# Patient Record
Sex: Male | Born: 1940 | Race: White | Hispanic: No
Health system: Northeastern US, Academic
[De-identification: ages and names within clinical notes are randomized; demographics above are authoritative.]

## PROBLEM LIST (undated history)

## (undated) DIAGNOSIS — I10 Essential (primary) hypertension: Secondary | ICD-10-CM

## (undated) DIAGNOSIS — C801 Malignant (primary) neoplasm, unspecified: Secondary | ICD-10-CM

## (undated) DIAGNOSIS — J45909 Unspecified asthma, uncomplicated: Secondary | ICD-10-CM

## (undated) DIAGNOSIS — H269 Unspecified cataract: Secondary | ICD-10-CM

## (undated) DIAGNOSIS — I2510 Atherosclerotic heart disease of native coronary artery without angina pectoris: Secondary | ICD-10-CM

## (undated) DIAGNOSIS — E785 Hyperlipidemia, unspecified: Secondary | ICD-10-CM

## (undated) DIAGNOSIS — J439 Emphysema, unspecified: Secondary | ICD-10-CM

## (undated) DIAGNOSIS — J449 Chronic obstructive pulmonary disease, unspecified: Secondary | ICD-10-CM

## (undated) HISTORY — DX: Essential (primary) hypertension: I10

## (undated) HISTORY — DX: Malignant (primary) neoplasm, unspecified: C80.1

## (undated) HISTORY — DX: Emphysema, unspecified: J43.9

## (undated) HISTORY — DX: Unspecified cataract: H26.9

## (undated) HISTORY — DX: Chronic obstructive pulmonary disease, unspecified: J44.9

## (undated) HISTORY — DX: Unspecified asthma, uncomplicated: J45.909

## (undated) HISTORY — DX: Hyperlipidemia, unspecified: E78.5

## (undated) HISTORY — PX: CORONARY ARTERY BYPASS GRAFT: SHX141

## (undated) HISTORY — PX: TONSILLECTOMY: SUR1361

---

## 2020-01-12 ENCOUNTER — Other Ambulatory Visit: Payer: Self-pay

## 2020-01-12 ENCOUNTER — Ambulatory Visit (INDEPENDENT_AMBULATORY_CARE_PROVIDER_SITE_OTHER): Payer: Medicare Other | Admitting: Family Medicine

## 2020-01-12 ENCOUNTER — Encounter: Payer: Self-pay | Admitting: Family Medicine

## 2020-01-12 ENCOUNTER — Telehealth: Payer: Self-pay

## 2020-01-12 DIAGNOSIS — K219 Gastro-esophageal reflux disease without esophagitis: Secondary | ICD-10-CM | POA: Diagnosis not present

## 2020-01-12 DIAGNOSIS — I2510 Atherosclerotic heart disease of native coronary artery without angina pectoris: Secondary | ICD-10-CM

## 2020-01-12 DIAGNOSIS — I2583 Coronary atherosclerosis due to lipid rich plaque: Secondary | ICD-10-CM

## 2020-01-12 DIAGNOSIS — J449 Chronic obstructive pulmonary disease, unspecified: Secondary | ICD-10-CM | POA: Insufficient documentation

## 2020-01-12 DIAGNOSIS — E559 Vitamin D deficiency, unspecified: Secondary | ICD-10-CM | POA: Diagnosis not present

## 2020-01-12 DIAGNOSIS — J310 Chronic rhinitis: Secondary | ICD-10-CM

## 2020-01-12 DIAGNOSIS — F325 Major depressive disorder, single episode, in full remission: Secondary | ICD-10-CM

## 2020-01-12 DIAGNOSIS — C61 Malignant neoplasm of prostate: Secondary | ICD-10-CM

## 2020-01-12 NOTE — Assessment & Plan Note (Signed)
Normal exam today. Unclear reasoning for recent flare. He is on stioloto daily which helps. Will place referral to pulmonology to establish care in the area. Also place referral to home health for home monitoring/evaluation given recent hospitalization due to COPD flare.

## 2020-01-12 NOTE — Assessment & Plan Note (Signed)
Stable on Prilosec 20 mg daily. 

## 2020-01-12 NOTE — Progress Notes (Signed)
Dave Silva is a 79 y.o. male who presents today for an office visit.  He is a new patient.   Assessment/Plan:  Chronic Problems Addressed Today: Prostate cancer [facility]) s/p radiation therapy Has not had any recurrence since radiation therapy. Gets PSA checked annually by PCP. Will check with his next blood draw.  Major depression in remission [facility]) Patient admits to feeling down due to loss of his wife last year. He is currently on Zoloft but does not feel like it is doing much of anything. Overall feels like his mood is okay. Will defer making any medication changes and follow-up in 6 months. Discussed reasons to return to care. No reported SI or HI.  Rhinitis Stable. We will continue Flonase.  Vitamin D deficiency Taking 1000 international units daily. Will check vitamin D level next blood draw.  GERD (gastroesophageal reflux disease) Stable on Prilosec 20 mg daily.  CAD s/p CABGx4 in 2010 On aspirin 81mg  daily and Crestor 20 mg daily. He is also on Zetia 10 mg daily. Will place referral to establish cardiologist in the area.  COPD ([place]) Normal exam today. Unclear reasoning for recent flare. He is on stioloto daily which helps. Will place referral to pulmonology to establish care in the area. Also place referral to home health for home monitoring/evaluation given recent hospitalization due to COPD flare.     Subjective:  HPI:  Patient here as a new patient to establish care. Moved to [place] about 2 weeks ago. Was previously in [place]. He is now living with his son temporarily but is looking to find a place on his own. He was doing very well in regards to his health until the beginning of last year. Unfortunately lost his wife and then became isolated due to the pandemic. He has been maintaining his doctors appointments but does admit that he was not taking care of himself like he should have been. History is not entirely clear but sounds like a welfare call was made about 4  to 6 weeks ago by his family as he was not picking up the phone. Nurse arrived on the scene and found him to be in what sounds like an acute COPD flare. He was apparently admitted to the hospital for a week or 2 and then discharged to a rehab facility for a few weeks. He left the rehab facility in [place] to live in [place]. Do not have any records available to review. He has done well since being here in [place].  See A/P for status of chronic conditions.  ROS: Per HPI, otherwise a complete review of systems was negative.   PMH:  The following were reviewed and entered/updated in epic: Past Medical History:  Diagnosis Date  . Asthma   . Cancer ([place])   . Cataract   . COPD (chronic obstructive pulmonary disease) ([place])   . Emphysema of lung ([place])   . Hyperlipidemia   . Hypertension    Patient Active Problem List   Diagnosis Date Noted  . CAD s/p CABGx4 in 2010 01/12/2020  . GERD (gastroesophageal reflux disease) 01/12/2020  . Vitamin D deficiency 01/12/2020  . Rhinitis 01/12/2020  . Major depression in remission ([place]) 01/12/2020  . Prostate cancer [facility]) s/p radiation therapy 01/12/2020  . COPD [facility])    Past Surgical History:  Procedure Laterality Date  . CORONARY ARTERY BYPASS GRAFT    . TONSILLECTOMY      Family History  Problem Relation Age of Onset  . Cancer Paternal Grandfather   .  Colon cancer Neg Hx     Medications- reviewed and updated Current Outpatient Medications  Medication Sig Dispense Refill  . aspirin 81 MG EC tablet Take 81 mg by mouth daily. Swallow whole.    . cholecalciferol (VITAMIN D3) 25 MCG (1000 UNIT) tablet Take 1,000 Units by mouth daily.    Marland Kitchen ezetimibe (ZETIA) 10 MG tablet Take 10 mg by mouth daily.    . fluticasone (FLONASE) 50 MCG/ACT nasal spray Place into both nostrils daily.    . montelukast (SINGULAIR) 10 MG tablet Take 10 mg by mouth at bedtime.    Marland Kitchen omeprazole (PRILOSEC) 20 MG capsule Take 20 mg by mouth daily.    . ramipril  (ALTACE) 2.5 MG capsule Take 2.5 mg by mouth daily.    . rosuvastatin (CRESTOR) 20 MG tablet Take 20 mg by mouth daily.    . sertraline (ZOLOFT) 50 MG tablet Take 50 mg by mouth daily.    . Tiotropium Bromide-Olodaterol (STIOLTO RESPIMAT) 2.5-2.5 MCG/ACT AERS Inhale into the lungs.     No current facility-administered medications for this visit.    Allergies-reviewed and updated No Known Allergies  Social History   Socioeconomic History  . Marital status: Widowed    Spouse name: Not on file  . Number of children: Not on file  . Years of education: Not on file  . Highest education level: Not on file  Occupational History  . Not on file  Tobacco Use  . Smoking status: Former Smoker    Types: Cigarettes    Quit date: 02/11/1985    Years since quitting: 34.9  Substance and Sexual Activity  . Alcohol use: Yes    Alcohol/week: 5.0 standard drinks    Types: 5 Glasses of wine per week    Comment: social  . Drug use: Never  . Sexual activity: Not on file  Other Topics Concern  . Not on file  Social History Narrative  . Not on file   Social Determinants of Health   Financial Resource Strain:   . Difficulty of Paying Living Expenses: Not on file  Food Insecurity:   . Worried About Charity fundraiser in the Last Year: Not on file  . Ran Out of Food in the Last Year: Not on file  Transportation Needs:   . Lack of Transportation (Medical): Not on file  . Lack of Transportation (Non-Medical): Not on file  Physical Activity:   . Days of Exercise per Week: Not on file  . Minutes of Exercise per Session: Not on file  Stress:   . Feeling of Stress : Not on file  Social Connections:   . Frequency of Communication with Friends and Family: Not on file  . Frequency of Social Gatherings with Friends and Family: Not on file  . Attends Religious Services: Not on file  . Active Member of Clubs or Organizations: Not on file  . Attends Archivist Meetings: Not on file  .  Marital Status: Not on file          Objective:  Physical Exam: BP (!) 94/58   Pulse 79   Temp 98.2 F (36.8 C) (Temporal)   Ht 5\' 11"  (1.803 m)   Wt 175 lb 9.6 oz (79.7 kg)   SpO2 95%   BMI 24.49 kg/m   Gen: No acute distress, resting comfortably CV: Regular rate and rhythm with no murmurs appreciated Pulm: Normal work of breathing, clear to auscultation bilaterally with no crackles, wheezes, or rhonchi Neuro:  Grossly normal, moves all extremities Psych: Normal affect and thought content  Time Spent: 64 minutes of total time was spent on the date of the encounter performing the following actions: chart review prior to seeing the patient, obtaining history, performing a medically necessary exam, counseling on the treatment plan, placing orders, and documenting in our EHR.        Algis Greenhouse. Jerline Pain, MD 01/13/2020 7:59 AM

## 2020-01-12 NOTE — Telephone Encounter (Signed)
Error

## 2020-01-12 NOTE — Assessment & Plan Note (Signed)
Stable. We will continue Flonase.

## 2020-01-12 NOTE — Assessment & Plan Note (Signed)
Has not had any recurrence since radiation therapy. Gets PSA checked annually by PCP. Will check with his next blood draw.

## 2020-01-12 NOTE — Assessment & Plan Note (Signed)
Taking 1000 international units daily. Will check vitamin D level next blood draw.

## 2020-01-12 NOTE — Assessment & Plan Note (Signed)
On aspirin 81mg  daily and Crestor 20 mg daily. He is also on Zetia 10 mg daily. Will place referral to establish cardiologist in the area.

## 2020-01-12 NOTE — Patient Instructions (Signed)
It was very nice to see you today!  We will place referrals to cardiology, pulmonology, and home health today.  No medication changes.  I will see you back in 6 months. Please come back to see me sooner if needed.  Take care, Dr Jerline Pain  Please try these tips to maintain a healthy lifestyle:   Eat at least 3 REAL meals and 1-2 snacks per day.  Aim for no more than 5 hours between eating.  If you eat breakfast, please do so within one hour of getting up.    Each meal should contain half fruits/vegetables, one quarter protein, and one quarter carbs (no bigger than a computer mouse)   Cut down on sweet beverages. This includes juice, soda, and sweet tea.     Drink at least 1 glass of water with each meal and aim for at least 8 glasses per day   Exercise at least 150 minutes every week.

## 2020-01-12 NOTE — Assessment & Plan Note (Signed)
Patient admits to feeling down due to loss of his wife last year. He is currently on Zoloft but does not feel like it is doing much of anything. Overall feels like his mood is okay. Will defer making any medication changes and follow-up in 6 months. Discussed reasons to return to care. No reported SI or HI.

## 2020-01-13 ENCOUNTER — Encounter: Payer: Self-pay | Admitting: Family Medicine

## 2020-01-26 ENCOUNTER — Telehealth: Payer: Self-pay | Admitting: *Deleted

## 2020-01-26 NOTE — Telephone Encounter (Signed)
[facility] call requesting VO for PT  Will send paper work to be sign by PCP

## 2020-01-27 NOTE — Telephone Encounter (Signed)
Ok with me. Please place any necessary orders. 

## 2020-01-28 ENCOUNTER — Telehealth: Payer: Self-pay

## 2020-01-28 NOTE — Telephone Encounter (Signed)
Dave Silva is calling in from [place], for his oxygen issues. Dave Silva says he scores why to high for them to continue care. [place] will not qualify as his oxygen only drops when he is exercising, but feels that he would benefit from outpatient pulmonary rehab.

## 2020-01-31 ENCOUNTER — Other Ambulatory Visit: Payer: Self-pay | Admitting: *Deleted

## 2020-01-31 DIAGNOSIS — J449 Chronic obstructive pulmonary disease, unspecified: Secondary | ICD-10-CM

## 2020-01-31 NOTE — Telephone Encounter (Signed)
Referral placed.

## 2020-01-31 NOTE — Telephone Encounter (Signed)
Please advise 

## 2020-01-31 NOTE — Telephone Encounter (Signed)
Ok to place referral to pulm rehab.  Dave Silva. Jerline Pain, MD 01/31/2020 4:12 PM

## 2020-02-21 ENCOUNTER — Encounter: Payer: Self-pay | Admitting: Cardiology

## 2020-02-21 ENCOUNTER — Ambulatory Visit: Payer: Self-pay | Admitting: Family Medicine

## 2020-02-21 ENCOUNTER — Ambulatory Visit (INDEPENDENT_AMBULATORY_CARE_PROVIDER_SITE_OTHER): Payer: Medicare Other | Admitting: Cardiology

## 2020-02-21 ENCOUNTER — Other Ambulatory Visit: Payer: Self-pay

## 2020-02-21 VITALS — BP 118/72 | HR 67 | Ht 71.0 in | Wt 176.0 lb

## 2020-02-21 DIAGNOSIS — Z79899 Other long term (current) drug therapy: Secondary | ICD-10-CM

## 2020-02-21 DIAGNOSIS — I2510 Atherosclerotic heart disease of native coronary artery without angina pectoris: Secondary | ICD-10-CM

## 2020-02-21 DIAGNOSIS — J438 Other emphysema: Secondary | ICD-10-CM | POA: Diagnosis not present

## 2020-02-21 MED ORDER — RAMIPRIL 2.5 MG PO CAPS
2.5000 mg | ORAL_CAPSULE | Freq: Every day | ORAL | 3 refills | Status: DC
Start: 2020-02-21 — End: 2020-04-10

## 2020-02-21 MED ORDER — ROSUVASTATIN CALCIUM 20 MG PO TABS
20.0000 mg | ORAL_TABLET | Freq: Every day | ORAL | 3 refills | Status: DC
Start: 2020-02-21 — End: 2022-06-15

## 2020-02-21 MED ORDER — EZETIMIBE 10 MG PO TABS
10.0000 mg | ORAL_TABLET | Freq: Every day | ORAL | 3 refills | Status: DC
Start: 2020-02-21 — End: 2022-06-15

## 2020-02-21 NOTE — Patient Instructions (Signed)
Medication Instructions:  The current medical regimen is effective;  continue present plan and medications.  *If you need a refill on your cardiac medications before your next appointment, please call your pharmacy*  Lab Work: Please have blood work today (CBC, CMP, Lipid, TSH and Free T4) If you have labs (blood work) drawn today and your tests are completely normal, you will receive your results only by: Marland Kitchen MyChart Message (if you have MyChart) OR . A paper copy in the mail If you have any lab test that is abnormal or we need to change your treatment, we will call you to review the results.  Follow-Up: At [facility], you and your health needs are our priority.  As part of our continuing mission to provide you with exceptional heart care, we have created designated Provider Care Teams.  These Care Teams include your primary Cardiologist (physician) and Advanced Practice Providers (APPs -  Physician Assistants and Nurse Practitioners) who all work together to provide you with the care you need, when you need it.  We recommend signing up for the patient portal called "MyChart".  Sign up information is provided on this After Visit Summary.  MyChart is used to connect with patients for Virtual Visits (Telemedicine).  Patients are able to view lab/test results, encounter notes, upcoming appointments, etc.  Non-urgent messages can be sent to your provider as well.   To learn more about what you can do with MyChart, go to [email or web address].    Your next appointment:   12 month(s)  The format for your next appointment:   In Person  Provider:   Candee Furbish, MD  Thank you for choosing [facility]!!

## 2020-02-21 NOTE — Progress Notes (Signed)
Cardiology Office Note:    Date:  02/21/2020   ID:  Dave Punt., DOB Mar 14, 1940, MRN [id number]  PCP:  Dave Barrack, MD  [place] Cardiologist:  No primary care provider on file.  [facility] Electrophysiologist:  None   Referring MD: Dave Barrack, MD     History of Present Illness:    Dave Washam. is a 80 y.o. male here for the evaluation of coronary artery disease status post CABG in 2010 at the request of Dr. Jerline Silva.  Has COPD, GERD, prostate cancer with no recurrence since radiation therapy.  Lost his wife in 2020.  Has battled with some hypotension in the past.  He got his undergrad degree at [employer] in physics.  Did graduate school at [employer].  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  He is here today with his daughter-in-law.  Recently moved from [place].  Denies any fevers chills nausea vomiting syncope bleeding  Past Medical History:  Diagnosis Date  . Asthma   . Cancer ([place])   . Cataract   . COPD (chronic obstructive pulmonary disease) ([place])   . Emphysema of lung ([place])   . Hyperlipidemia   . Hypertension     Past Surgical History:  Procedure Laterality Date  . CORONARY ARTERY BYPASS GRAFT    . TONSILLECTOMY      Current Medications: Current Meds  Medication Sig  . aspirin 81 MG EC tablet Take 81 mg by mouth daily. Swallow whole.  . Cholecalciferol (VITAMIN D3) 50 MCG (2000 UT) TABS Take 4,000 Units by mouth daily.  . fluticasone (FLONASE) 50 MCG/ACT nasal spray Place into both nostrils daily.  . montelukast (SINGULAIR) 10 MG tablet Take 10 mg by mouth at bedtime.  Marland Kitchen omeprazole (PRILOSEC) 20 MG capsule Take 20 mg by mouth daily.  . sertraline (ZOLOFT) 50 MG tablet Take 50 mg by mouth daily.  . Tiotropium Bromide-Olodaterol (STIOLTO RESPIMAT) 2.5-2.5 MCG/ACT AERS Inhale into the lungs.  . [DISCONTINUED] ezetimibe  (ZETIA) 10 MG tablet Take 10 mg by mouth daily.  . [DISCONTINUED] ramipril (ALTACE) 2.5 MG capsule Take 2.5 mg by mouth daily.  . [DISCONTINUED] rosuvastatin (CRESTOR) 20 MG tablet Take 20 mg by mouth daily.     Allergies:   Patient has no known allergies.   Social History   Socioeconomic History  . Marital status: Widowed    Spouse name: Not on file  . Number of children: Not on file  . Years of education: Not on file  . Highest education level: Not on file  Occupational History  . Not on file  Tobacco Use  . Smoking status: Former Smoker    Types: Cigarettes    Quit date: 02/11/1985    Years since quitting: 35.0  . Smokeless tobacco: Never Used  Substance and Sexual Activity  . Alcohol use: Yes    Alcohol/week: 5.0 standard drinks    Types: 5 Glasses of wine per week    Comment: social  . Drug use: Never  . Sexual activity: Not on file  Other Topics Concern  . Not on file  Social History Narrative  . Not on file   Social Determinants of Health   Financial Resource Strain: Not on file  Food Insecurity: Not on file  Transportation Needs: Not on file  Physical Activity: Not on file  Stress: Not on file  Social Connections: Not on file  Family History: The patient's family history includes Cancer in his paternal grandfather. There is no history of Colon cancer.  ROS:   Please see the history of present illness.     All other systems reviewed and are negative.  EKGs/Labs/Other Studies Reviewed:    The following studies were reviewed today: Prior office notes reviewed  EKG:  EKG is  ordered today.  The ekg ordered today demonstrates sinus rhythm 67 PVC  Recent Labs: No results found for requested labs within last 8760 hours.  Recent Lipid Panel No results found for: CHOL, TRIG, HDL, CHOLHDL, VLDL, LDLCALC, LDLDIRECT   Risk Assessment/Calculations:       Physical Exam:    VS:  BP 118/72   Pulse 67   Ht 5\' 11"  (1.803 m)   Wt 176 lb (79.8 kg)   BMI  24.55 kg/m     Wt Readings from Last 3 Encounters:  02/21/20 176 lb (79.8 kg)  01/12/20 175 lb 9.6 oz (79.7 kg)     GEN:  Well nourished, well developed in no acute distress HEENT: Normal NECK: No JVD; No carotid bruits LYMPHATICS: No lymphadenopathy CARDIAC: RRR, no murmurs, rubs, gallops RESPIRATORY:  Clear to auscultation without rales, wheezing or rhonchi  ABDOMEN: Soft, non-tender, non-distended MUSCULOSKELETAL:  No edema; No deformity  SKIN: Warm and dry NEUROLOGIC:  Alert and oriented x 3 PSYCHIATRIC:  Normal affect   ASSESSMENT:    1. Coronary artery disease involving native coronary artery of native heart without angina pectoris   2. Other emphysema ([place])   3. Medication management    PLAN:    In order of problems listed above:  Coronary artery disease - Status post CABG in 2010. [employer]. Dr. Alfonso Ramus  Overall been doing quite well without any anginal symptoms.  Continue with goal-directed medical therapy. -Continue with aspirin, statin  COPD - Pulmonary referral in place.  On inhalers.  Former smoker.  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 Jerline Silva.  Checking TSH and free T4.  Hyperlipidemia - Currently on Crestor 20, Zetia 10.  Checking lipid panel.  Liver function.  1 year follow up      Medication Adjustments/Labs and Tests Ordered: Current medicines are reviewed at length with the patient today.  Concerns regarding medicines are outlined above.  Orders Placed This Encounter  Procedures  . CBC  . Comprehensive metabolic panel  . Lipid panel  . TSH  . T4, free  . EKG 12-Lead   Meds ordered this encounter  Medications  . rosuvastatin (CRESTOR) 20 MG tablet    Sig: Take 1 tablet (20 mg total) by mouth daily.    Dispense:  90 tablet    Refill:  3  . ramipril (ALTACE) 2.5 MG capsule    Sig: Take 1 capsule (2.5 mg total) by mouth daily.    Dispense:  90 capsule    Refill:  3  . ezetimibe (ZETIA) 10 MG  tablet    Sig: Take 1 tablet (10 mg total) by mouth daily.    Dispense:  90 tablet    Refill:  3    Patient Instructions  Medication Instructions:  The current medical regimen is effective;  continue present plan and medications.  *If you need a refill on your cardiac medications before your next appointment, please call your pharmacy*  Lab Work: Please have blood work today (CBC, CMP, Lipid, TSH and Free T4) If you have labs (blood work) drawn  today and your tests are completely normal, you will receive your results only by: Marland Kitchen MyChart Message (if you have MyChart) OR . A paper copy in the mail If you have any lab test that is abnormal or we need to change your treatment, we will call you to review the results.  Follow-Up: At [facility], you and your health needs are our priority.  As part of our continuing mission to provide you with exceptional heart care, we have created designated Provider Care Teams.  These Care Teams include your primary Cardiologist (physician) and Advanced Practice Providers (APPs -  Physician Assistants and Nurse Practitioners) who all work together to provide you with the care you need, when you need it.  We recommend signing up for the patient portal called "MyChart".  Sign up information is provided on this After Visit Summary.  MyChart is used to connect with patients for Virtual Visits (Telemedicine).  Patients are able to view lab/test results, encounter notes, upcoming appointments, etc.  Non-urgent messages can be sent to your provider as well.   To learn more about what you can do with MyChart, go to [email or web address].    Your next appointment:   12 month(s)  The format for your next appointment:   In Person  Provider:   Candee Furbish, MD  Thank you for choosing [facility]!!        Signed, Candee Furbish, MD  02/21/2020 3:35 PM    [place]

## 2020-02-22 LAB — COMPREHENSIVE METABOLIC PANEL
ALT: 11 IU/L (ref 0–44)
AST: 17 IU/L (ref 0–40)
Albumin/Globulin Ratio: 1.5 (ref 1.2–2.2)
Albumin: 3.8 g/dL (ref 3.7–4.7)
Alkaline Phosphatase: 64 IU/L (ref 44–121)
BUN/Creatinine Ratio: 19 (ref 10–24)
BUN: 18 mg/dL (ref 8–27)
Bilirubin Total: 0.5 mg/dL (ref 0.0–1.2)
CO2: 25 mmol/L (ref 20–29)
Calcium: 8.9 mg/dL (ref 8.6–10.2)
Chloride: 100 mmol/L (ref 96–106)
Creatinine, Ser: 0.93 mg/dL (ref 0.76–1.27)
GFR calc Af Amer: 89 mL/min/{1.73_m2} (ref >59)
GFR calc non Af Amer: 77 mL/min/{1.73_m2} (ref >59)
Globulin, Total: 2.5 g/dL (ref 1.5–4.5)
Glucose: 97 mg/dL (ref 65–99)
Potassium: 4.5 mmol/L (ref 3.5–5.2)
Sodium: 140 mmol/L (ref 134–144)
Total Protein: 6.3 g/dL (ref 6.0–8.5)

## 2020-02-22 LAB — LIPID PANEL
Chol/HDL Ratio: 2.6 ratio (ref 0.0–5.0)
Cholesterol, Total: 146 mg/dL (ref 100–199)
HDL: 56 mg/dL (ref >39)
LDL Chol Calc (NIH): 73 mg/dL (ref 0–99)
Triglycerides: 94 mg/dL (ref 0–149)
VLDL Cholesterol Cal: 17 mg/dL (ref 5–40)

## 2020-02-22 LAB — CBC
Hematocrit: 43.8 % (ref 37.5–51.0)
Hemoglobin: 14.8 g/dL (ref 13.0–17.7)
MCH: 34.7 pg — ABNORMAL HIGH (ref 26.6–33.0)
MCHC: 33.8 g/dL (ref 31.5–35.7)
MCV: 103 fL — ABNORMAL HIGH (ref 79–97)
Platelets: 132 10*3/uL — ABNORMAL LOW (ref 150–450)
RBC: 4.27 x10E6/uL (ref 4.14–5.80)
RDW: 12.3 % (ref 11.6–15.4)
WBC: 9.7 10*3/uL (ref 3.4–10.8)

## 2020-02-22 LAB — TSH: TSH: 4.25 u[IU]/mL (ref 0.450–4.500)

## 2020-02-22 LAB — T4, FREE: Free T4: 0.73 ng/dL — ABNORMAL LOW (ref 0.82–1.77)

## 2020-03-30 ENCOUNTER — Institutional Professional Consult (permissible substitution): Payer: Medicare Other | Admitting: Internal Medicine

## 2020-04-10 ENCOUNTER — Encounter: Payer: Self-pay | Admitting: Internal Medicine

## 2020-04-10 ENCOUNTER — Ambulatory Visit (INDEPENDENT_AMBULATORY_CARE_PROVIDER_SITE_OTHER): Payer: Medicare Other | Admitting: Internal Medicine

## 2020-04-10 ENCOUNTER — Other Ambulatory Visit: Payer: Self-pay

## 2020-04-10 ENCOUNTER — Ambulatory Visit (INDEPENDENT_AMBULATORY_CARE_PROVIDER_SITE_OTHER): Payer: Medicare Other

## 2020-04-10 DIAGNOSIS — J449 Chronic obstructive pulmonary disease, unspecified: Secondary | ICD-10-CM

## 2020-04-10 DIAGNOSIS — I2510 Atherosclerotic heart disease of native coronary artery without angina pectoris: Secondary | ICD-10-CM

## 2020-04-10 DIAGNOSIS — I10 Essential (primary) hypertension: Secondary | ICD-10-CM | POA: Diagnosis not present

## 2020-04-10 MED ORDER — STIOLTO RESPIMAT 2.5-2.5 MCG/ACT IN AERS: 2.0000 | INHALATION_SPRAY | Freq: Every day | RESPIRATORY_TRACT | Status: DC

## 2020-04-10 MED ORDER — VALSARTAN 80 MG PO TABS: 80.0000 mg | ORAL_TABLET | Freq: Every day | ORAL | 11 refills | Status: AC

## 2020-04-10 NOTE — Assessment & Plan Note (Signed)
Quit smoking 1987 but did not need maint rx until around 2012   -   04/10/2020  After extensive coaching inhaler device,  effectiveness =    75% from a baseline of 25% with SMI > continue stiolto 2 q am and d/c acei    When respiratory symptoms begin or become refractory well after a patient reports complete smoking cessation,  Especially when this wasn't the case while they were smoking, a red flag is raised based on the work of Dr Kris Mouton which states:  if you quit smoking when your best day FEV1 is still well preserved it is highly unlikely you will progress to severe disease.  That is to say, once the smoking stops,  the symptoms should not suddenly erupt or markedly worsen.  If so, the differential diagnosis should include  obesity/deconditioning,  LPR/Reflux/Aspiration syndromes,  occult CHF, or  especially side effect of medications commonly used in this population, esp ACEi   First step is try off acei, no change in stiolto for now and return for PFT in 6 weeks

## 2020-04-10 NOTE — Assessment & Plan Note (Signed)
D/c acei 04/10/2020   ACE inhibitors are problematic in  pts with airway complaints because  even experienced pulmonologists can't always distinguish ace effects from copd/asthma.  By themselves they don't actually cause a problem, much like oxygen can't by itself start a fire, but they certainly serve as a powerful catalyst or enhancer for any "fire"  or inflammatory process in the upper airway, be it caused by an ET  tube or more commonly reflux (especially in the obese or pts with known GERD or who are on biphoshonates).    In the era of ARB near equivalency until we have a better handle on the reversibility of the airway problem, it just makes sense to avoid ACEI  entirely in the short run and then decide later, having established a level of airway control using a reasonable limited regimen, whether to add back ace but even then being very careful to observe the pt for worsening airway control and number of meds used/ needed to control symptoms.    >>> try valsartan 80 mg daily and return for pfts in 6 weeks           Each maintenance medication was reviewed in detail including emphasizing most importantly the difference between maintenance and prns and under what circumstances the prns are to be triggered using an action plan format where appropriate.  Total time for H and P, chart review, counseling, reviewing smi device(s) and generating customized AVS unique to this new pt office visit / same day charting = 45 min

## 2020-04-10 NOTE — Progress Notes (Signed)
Dave Punt., male    DOB: 1940/09/20, 80 y.o.   MRN: [id number]   Brief patient profile:  80 yowm [employer] graduate quit  smoking 1987 with recurrent bronchitis in grade school and then started smoking and improved  some p quit smoking but worse since 2012 requring maint rx for the first time while living in [place] but moved to [place] winter of 2022 and referred to pulmonary clinic 04/10/2020 by Dr   Jerline Pain as was being followe by pulmonologist in [place]     History of Present Illness  04/10/2020  Pulmonary/ 1st office eval/Alfred Harrel  Chief Complaint  Patient presents with  . Consult    Hx copd, live in [place] part of the year.  [place] nurse wanted him to get established here.  Dyspnea:  Slow pace does fine in nice weather  Cough: raspy throat / no am flares/ no excess or purulent sputum Sleep: on side but bed is flat SABA use: not at presen Covid 19 vax to max  (x3)   No obvious day to day or daytime variability or assoc excess/ purulent sputum or mucus plugs or hemoptysis or cp or chest tightness, subjective wheeze or overt sinus or hb symptoms.   Sleeping as above without nocturnal  or early am exacerbation  of respiratory  c/o's or need for noct saba. Also denies any obvious fluctuation of symptoms with weather or environmental changes or other aggravating or alleviating factors except as outlined above   No unusual exposure hx or h/o childhood pna/ asthma or knowledge of premature birth.  Current Allergies, Complete Past Medical History, Past Surgical History, Family History, and Social History were reviewed in [employer] record.  ROS  The following are not active complaints unless bolded Hoarseness, sore throat, dysphagia, dental problems, itching, sneezing,  nasal congestion or discharge of excess mucus or purulent secretions, ear ache,   fever, chills, sweats, unintended wt loss or wt gain, classically pleuritic or exertional cp,  orthopnea pnd or arm/hand swelling   or leg swelling, presyncope, palpitations, abdominal pain, anorexia, nausea, vomiting, diarrhea  or change in bowel habits or change in bladder habits, change in stools or change in urine, dysuria, hematuria,  rash, arthralgias, visual complaints, headache, numbness, weakness or ataxia or problems with walking or coordination,  change in mood or  memory.             Past Medical History:  Diagnosis Date  . Asthma   . Cancer ([place])   . Cataract   . COPD (chronic obstructive pulmonary disease) ([place])   . Emphysema of lung ([place])   . Hyperlipidemia   . Hypertension     Outpatient Medications Prior to Visit  Medication Sig Dispense Refill  . aspirin 81 MG EC tablet Take 81 mg by mouth daily. Swallow whole.    . ezetimibe (ZETIA) 10 MG tablet Take 1 tablet (10 mg total) by mouth daily. 90 tablet 3  . montelukast (SINGULAIR) 10 MG tablet Take 10 mg by mouth at bedtime.    Marland Kitchen omeprazole (PRILOSEC) 20 MG capsule Take 20 mg by mouth daily.    . ramipril (ALTACE) 2.5 MG capsule Take 1 capsule (2.5 mg total) by mouth daily. 90 capsule 3  . rosuvastatin (CRESTOR) 20 MG tablet Take 1 tablet (20 mg total) by mouth daily. 90 tablet 3  . sertraline (ZOLOFT) 50 MG tablet Take 50 mg by mouth daily.    . Tiotropium Bromide-Olodaterol (STIOLTO RESPIMAT) 2.5-2.5 MCG/ACT AERS Inhale  into the lungs.    . Tiotropium Bromide-Olodaterol (STIOLTO RESPIMAT) 2.5-2.5 MCG/ACT AERS Stiolto Respimat 2.5 mcg-2.5 mcg/actuation solution for inhalation    . Cholecalciferol (VITAMIN D3) 50 MCG (2000 UT) TABS Take 4,000 Units by mouth daily. (Patient not taking: Reported on 04/10/2020)    . fluticasone (FLONASE) 50 MCG/ACT nasal spray Place into both nostrils daily. (Patient not taking: Reported on 04/10/2020)     No facility-administered medications prior to visit.     Objective:     BP 130/80 (BP Location: Right Arm, Patient Position: Sitting, Cuff Size: Normal)   Pulse 74   Temp 98.2 F (36.8 C) (Temporal)   Ht 5'  11" (1.803 m)   Wt 176 lb 9.6 oz (80.1 kg)   SpO2 97%   BMI 24.63 kg/m   SpO2: 97 %   amb elderly wm raspy cough   HEENT : pt wearing mask not removed for exam due to covid - 19 concerns.   NECK :  without JVD/Nodes/TM/ nl carotid upstrokes bilaterally   LUNGS: no acc muscle use,  Min barrel  contour chest wall with bilateral  slightly decreased bs s audible wheeze and  without cough on insp or exp maneuvers and min  Hyperresonant  to  percussion bilaterally     CV:  RRR  no s3 or murmur or increase in P2, and no edema   ABD:  soft and nontender with pos end  insp Hoover's  in the supine position. No bruits or organomegaly appreciated, bowel sounds nl  MS:   Nl gait/  ext warm without deformities, calf tenderness, cyanosis or clubbing No obvious joint restrictions   SKIN: warm and dry without lesions    NEURO:  alert, approp, nl sensorium with  no motor or cerebellar deficits apparent.        CXR PA and Lateral:   04/10/2020 :    I personally reviewed images and agree with radiology impression as follows:   Moderate-sized hiatal hernia. No active disease.       Assessment   COPD GOLD ? Quit smoking 1987 but did not need maint rx until around 2012   -   04/10/2020  After extensive coaching inhaler device,  effectiveness =    75% from a baseline of 25% with SMI > continue stiolto 2 q am and d/c acei    When respiratory symptoms begin or become refractory well after a patient reports complete smoking cessation,  Especially when this wasn't the case while they were smoking, a red flag is raised based on the work of Dr Kris Mouton which states:  if you quit smoking when your best day FEV1 is still well preserved it is highly unlikely you will progress to severe disease.  That is to say, once the smoking stops,  the symptoms should not suddenly erupt or markedly worsen.  If so, the differential diagnosis should include  obesity/deconditioning,  LPR/Reflux/Aspiration syndromes,   occult CHF, or  especially side effect of medications commonly used in this population, esp ACEi   First step is try off acei, no change in stiolto for now and return for PFT in 6 weeks     Essential hypertension D/c acei 04/10/2020   ACE inhibitors are problematic in  pts with airway complaints because  even experienced pulmonologists can't always distinguish ace effects from copd/asthma.  By themselves they don't actually cause a problem, much like oxygen can't by itself start a fire, but they certainly serve as a powerful catalyst  or enhancer for any "fire"  or inflammatory process in the upper airway, be it caused by an ET  tube or more commonly reflux (especially in the obese or pts with known GERD or who are on biphoshonates).    In the era of ARB near equivalency until we have a better handle on the reversibility of the airway problem, it just makes sense to avoid ACEI  entirely in the short run and then decide later, having established a level of airway control using a reasonable limited regimen, whether to add back ace but even then being very careful to observe the pt for worsening airway control and number of meds used/ needed to control symptoms.    >>> try valsartan 80 mg daily and return for pfts in 6 weeks           Each maintenance medication was reviewed in detail including emphasizing most importantly the difference between maintenance and prns and under what circumstances the prns are to be triggered using an action plan format where appropriate.  Total time for H and P, chart review, counseling, reviewing smi device(s) and generating customized AVS unique to this new pt office visit / same day charting = 45 min             [name], MD 04/10/2020

## 2020-04-10 NOTE — Patient Instructions (Addendum)
No change in your inhaler  Stop ramapril and start valsartan 80 mg daily in its place    Please remember to go to the  x-ray department  for your tests - we will call you with the results when they are available      Please schedule a follow up office visit in 6 weeks, call sooner if needed with pfts on return

## 2020-04-10 NOTE — Progress Notes (Signed)
   Dave Punt., male    DOB: 10/03/1940, 80 y.o.   MRN: [id number]   Brief patient profile:  80 yowm quit  smoker 1987 with recurrent bronchitis in grade school and then started smoking and stabilized some p quit smoking but worse      History of Present Illness  04/10/2020  Pulmonary/ 1st office eval/Dave Silva  Chief Complaint  Patient presents with  . Consult    Hx copd, live in [place] part of the year.  [place] nurse wanted him to get established here.  Dyspnea:  Slow pace does fine in nice weather  Cough: raspy throat / no am flare Sleep: on side but bed  SABA use: not at presen vax to max  (x3)   Past Medical History:  Diagnosis Date  . Asthma   . Cancer (Dave Silva)   . Cataract   . COPD (chronic obstructive pulmonary disease) (Dave Silva)   . Emphysema of lung (Dave Silva)   . Hyperlipidemia   . Hypertension     Outpatient Medications Prior to Visit  Medication Sig Dispense Refill  . aspirin 81 MG EC tablet Take 81 mg by mouth daily. Swallow whole.    . ezetimibe (ZETIA) 10 MG tablet Take 1 tablet (10 mg total) by mouth daily. 90 tablet 3  . montelukast (SINGULAIR) 10 MG tablet Take 10 mg by mouth at bedtime.    Marland Kitchen omeprazole (PRILOSEC) 20 MG capsule Take 20 mg by mouth daily.    . ramipril (ALTACE) 2.5 MG capsule Take 1 capsule (2.5 mg total) by mouth daily. 90 capsule 3  . rosuvastatin (CRESTOR) 20 MG tablet Take 1 tablet (20 mg total) by mouth daily. 90 tablet 3  . sertraline (ZOLOFT) 50 MG tablet Take 50 mg by mouth daily.    . Tiotropium Bromide-Olodaterol (STIOLTO RESPIMAT) 2.5-2.5 MCG/ACT AERS Inhale into the lungs.    . Tiotropium Bromide-Olodaterol (STIOLTO RESPIMAT) 2.5-2.5 MCG/ACT AERS Stiolto Respimat 2.5 mcg-2.5 mcg/actuation solution for inhalation    . Cholecalciferol (VITAMIN D3) 50 MCG (2000 UT) TABS Take 4,000 Units by mouth daily. (Patient not taking: Reported on 04/10/2020)    . fluticasone (FLONASE) 50 MCG/ACT nasal spray Place into both nostrils daily. (Patient not taking:  Reported on 04/10/2020)     No facility-administered medications prior to visit.     Objective:     BP 130/80 (BP Location: Right Arm, Patient Position: Sitting, Cuff Size: Normal)   Pulse 74   Temp 98.2 F (36.8 C) (Temporal)   Ht 5\' 11"  (1.803 m)   Wt 176 lb 9.6 oz (80.1 kg)   SpO2 97%   BMI 24.63 kg/m   SpO2: 97 %   amb wm raspy cough       Assessment   No problem-specific Assessment & Plan notes found for this encounter.     Dave Gully, MD 04/10/2020

## 2020-04-11 ENCOUNTER — Encounter: Payer: Self-pay | Admitting: *Deleted

## 2020-04-12 ENCOUNTER — Encounter: Payer: Self-pay | Admitting: Internal Medicine

## 2020-05-23 ENCOUNTER — Ambulatory Visit: Payer: Medicare Other | Admitting: Internal Medicine

## 2020-05-26 ENCOUNTER — Telehealth: Payer: Self-pay | Admitting: Cardiology

## 2020-05-26 NOTE — Telephone Encounter (Signed)
Called pt and spoke with daughter to inform them that pt's medication was already at his pharmacy and the pharmacy is getting his medication ready to be picked up. I advised them that if they have any other problems, questions or concerns, to give our office a call. Pt's daughter verbalized understanding.

## 2020-05-26 NOTE — Telephone Encounter (Signed)
*  STAT* If patient is at the pharmacy, call can be transferred to refill team.   1. Which medications need to be refilled? (please list name of each medication and dose if known) ezetimibe (ZETIA) 10 MG tablet  2. Which pharmacy/location (including street and city if local pharmacy) is medication to be sent to? [id number] - [place], [place] - [address]  3. Do they need a 30 day or 90 day supply? 90 day supply

## 2020-06-07 ENCOUNTER — Other Ambulatory Visit: Payer: Self-pay

## 2020-06-07 ENCOUNTER — Ambulatory Visit (INDEPENDENT_AMBULATORY_CARE_PROVIDER_SITE_OTHER): Payer: Medicare Other | Admitting: Internal Medicine

## 2020-06-07 ENCOUNTER — Encounter: Payer: Self-pay | Admitting: Internal Medicine

## 2020-06-07 DIAGNOSIS — I10 Essential (primary) hypertension: Secondary | ICD-10-CM

## 2020-06-07 DIAGNOSIS — J449 Chronic obstructive pulmonary disease, unspecified: Secondary | ICD-10-CM | POA: Diagnosis not present

## 2020-06-07 DIAGNOSIS — I2510 Atherosclerotic heart disease of native coronary artery without angina pectoris: Secondary | ICD-10-CM | POA: Diagnosis not present

## 2020-06-07 NOTE — Patient Instructions (Signed)
Continue stiolto 2 puffs each am    Continue to walk daily if you can    Return for pfts and office visit same day

## 2020-06-07 NOTE — Addendum Note (Signed)
Addended by: Mathis Bud on: 06/07/2020 12:08 PM   Modules accepted: Orders

## 2020-06-07 NOTE — Progress Notes (Signed)
Dave Punt., male    DOB: Sep 17, 1940, 80 y.o.   MRN: [id number]   Brief patient profile:  58 yowm [employer] graduate quit  smoking 1987 with "recurrent bronchitis" in grade school and then started smoking and improved  some p quit smoking but worse since 2012 requring maint rx for the first time while living in [place] but moved to [place] winter of 2022 and referred to pulmonary clinic 04/10/2020 by Dr   Jerline Pain as was being followe by pulmonologist in [place]     History of Present Illness  04/10/2020  Pulmonary/ 1st office eval/Laniesha Das  Chief Complaint  Patient presents with  . Consult    Hx copd, live in [place] part of the year.  [place] nurse wanted him to get established here.  Dyspnea:  Slow pace does fine in nice weather  Cough: raspy throat / no am flares/ no excess or purulent sputum Sleep: on side but bed is flat SABA use: not at present Covid 19 vax x 3 rec No change in your inhaler Stop ramapril and start valsartan 80 mg daily in its place  Please schedule a follow up office visit in 6 weeks, call sooner if needed with pfts on return     06/07/2020  f/u ov/Joshua Zeringue re: copd ? [place] on stiolto  Chief Complaint  Patient presents with  . Follow-up    Sob-same, congestion in am  Dyspnea: walking 12-15 min included some hills s stopping (did not attempt any hills in [place])  Cough: no excess mucus  Just sense of chest congestion in am  Sleeping: ok sleeping SABA use: none  02: none  Covid status:   X 3  moderna    No obvious day to day or daytime variability or assoc excess/ purulent sputum or mucus plugs or hemoptysis or cp or chest tightness, subjective wheeze or overt sinus or hb symptoms.   Sleeping  without nocturnal  or early am exacerbation  of respiratory  c/o's or need for noct saba. Also denies any obvious fluctuation of symptoms with weather or environmental changes or other aggravating or alleviating factors except as outlined above   No unusual exposure hx or h/o  childhood pna/ asthma or knowledge of premature birth.  Current Allergies, Complete Past Medical History, Past Surgical History, Family History, and Social History were reviewed in [employer] record.  ROS  The following are not active complaints unless bolded Hoarseness, sore throat, dysphagia, dental problems, itching, sneezing,  nasal congestion or discharge of excess mucus or purulent secretions, ear ache,   fever, chills, sweats, unintended wt loss or wt gain, classically pleuritic or exertional cp,  orthopnea pnd or arm/hand swelling  or leg swelling, presyncope, palpitations, abdominal pain, anorexia, nausea, vomiting, diarrhea  or change in bowel habits or change in bladder habits, change in stools or change in urine, dysuria, hematuria,  rash, arthralgias, visual complaints, headache, numbness, weakness or ataxia or problems with walking or coordination,  change in mood or  memory.        Current Meds  Medication Sig  . aspirin 81 MG EC tablet Take 81 mg by mouth daily. Swallow whole.  . Cholecalciferol (VITAMIN D3) 50 MCG (2000 UT) TABS Take 4,000 Units by mouth daily.  Marland Kitchen ezetimibe (ZETIA) 10 MG tablet Take 1 tablet (10 mg total) by mouth daily.  . fluticasone (FLONASE) 50 MCG/ACT nasal spray Place into both nostrils daily.  . montelukast (SINGULAIR) 10 MG tablet Take 10 mg by  mouth at bedtime.  Marland Kitchen omeprazole (PRILOSEC) 20 MG capsule Take 20 mg by mouth daily.  . rosuvastatin (CRESTOR) 20 MG tablet Take 1 tablet (20 mg total) by mouth daily.  . sertraline (ZOLOFT) 50 MG tablet Take 50 mg by mouth daily.  . Tiotropium Bromide-Olodaterol (STIOLTO RESPIMAT) 2.5-2.5 MCG/ACT AERS Inhale 2 puffs into the lungs daily.  . valsartan (DIOVAN) 80 MG tablet Take 1 tablet (80 mg total) by mouth daily.           Past Medical History:  Diagnosis Date  . Asthma   . Cancer ([place])   . Cataract   . COPD (chronic obstructive pulmonary disease) ([place])   . Emphysema of lung  ([place])   . Hyperlipidemia   . Hypertension        Objective:        06/07/2020       176  04/10/20 176 lb 9.6 oz (80.1 kg)  02/21/20 176 lb (79.8 kg)  01/12/20 175 lb 9.6 oz (79.7 kg)      Vital signs reviewed  06/07/2020  - Note at rest 02 sats  95% on RA   General appearance:    amb wm nad      HEENT : pt wearing mask not removed for exam due to covid - 19 concerns.    NECK :  without JVD/Nodes/TM/ nl carotid upstrokes bilaterally   LUNGS: no acc muscle use,  Mild barrel  contour chest wall with bilateral  Distant bs s audible wheeze and  without cough on insp or exp maneuvers  and mild  Hyperresonant  to  percussion bilaterally     CV:  RRR  no s3 or murmur or increase in P2, and no edema   ABD:  soft and nontender with pos end  insp Hoover's  in the supine position. No bruits or organomegaly appreciated, bowel sounds nl  MS:   Nl gait/  ext warm without deformities, calf tenderness, cyanosis or clubbing No obvious joint restrictions   SKIN: warm and dry without lesions    NEURO:  alert, approp, nl sensorium with  no motor or cerebellar deficits apparent.           Assessment

## 2020-06-07 NOTE — Assessment & Plan Note (Signed)
D/c acei 04/10/2020 due to "congestion"   Adequate control on present rx, reviewed in detail with pt > no change in rx needed    Comment: Although even in retrospect it may not be clear the ACEi contributed to the pt's symptoms,  adding them back at this point or in the future would risk confusion in interpretation of non-specific respiratory symptoms to which this patient is prone  ie  Better not to muddy the waters here.   >>> continue diovan 80 mg one daily          Each maintenance medication was reviewed in detail including emphasizing most importantly the difference between maintenance and prns and under what circumstances the prns are to be triggered using an action plan format where appropriate.  Total time for H and P, chart review, counseling, reviewing smi device(s) and generating customized AVS unique to this office visit / same day charting = 25 min

## 2020-06-07 NOTE — Assessment & Plan Note (Signed)
Quit smoking 1987 but did not need maint rx until around 2012   -   04/10/2020  After extensive coaching inhaler device,  effectiveness =    75% from a baseline of 25% with SMI > continue stiolto 2 q am and d/c acei   Pt is Group B in terms of symptom/risk and laba/lama therefore appropriate rx at this point >>>  Continue stiolto/ daily walks but push to 20-30 min as tol   F/u with pfts w/a

## 2020-07-04 ENCOUNTER — Other Ambulatory Visit (HOSPITAL_COMMUNITY): Payer: Medicare Other

## 2020-07-07 ENCOUNTER — Ambulatory Visit: Payer: Medicare Other | Admitting: Internal Medicine

## 2020-07-10 ENCOUNTER — Other Ambulatory Visit: Payer: Self-pay | Admitting: *Deleted

## 2020-07-10 MED ORDER — STIOLTO RESPIMAT 2.5-2.5 MCG/ACT IN AERS: 2.0000 | INHALATION_SPRAY | Freq: Every day | RESPIRATORY_TRACT | 6 refills | Status: AC

## 2020-07-11 ENCOUNTER — Other Ambulatory Visit (HOSPITAL_COMMUNITY)
Admission: RE | Admit: 2020-07-11 | Discharge: 2020-07-11 | Disposition: A | Discharge status: Home / Self Care | Payer: Medicare Other | Source: Ambulatory Visit | Attending: Internal Medicine | Admitting: Internal Medicine

## 2020-07-11 DIAGNOSIS — Z01812 Encounter for preprocedural laboratory examination: Secondary | ICD-10-CM | POA: Diagnosis present

## 2020-07-11 DIAGNOSIS — Z20822 Contact with and (suspected) exposure to covid-19: Secondary | ICD-10-CM | POA: Insufficient documentation

## 2020-07-11 LAB — SARS CORONAVIRUS 2 (TAT 6-24 HRS): SARS Coronavirus 2: NEGATIVE

## 2020-07-13 ENCOUNTER — Ambulatory Visit (INDEPENDENT_AMBULATORY_CARE_PROVIDER_SITE_OTHER): Payer: Medicare Other | Admitting: Family Medicine

## 2020-07-13 ENCOUNTER — Encounter: Payer: Self-pay | Admitting: Family Medicine

## 2020-07-13 ENCOUNTER — Other Ambulatory Visit: Payer: Self-pay

## 2020-07-13 ENCOUNTER — Encounter: Payer: Self-pay | Admitting: *Deleted

## 2020-07-13 VITALS — BP 122/74 | HR 74 | Temp 97.6°F | Ht 71.0 in | Wt 175.2 lb

## 2020-07-13 DIAGNOSIS — F325 Major depressive disorder, single episode, in full remission: Secondary | ICD-10-CM | POA: Diagnosis not present

## 2020-07-13 DIAGNOSIS — E559 Vitamin D deficiency, unspecified: Secondary | ICD-10-CM

## 2020-07-13 DIAGNOSIS — I10 Essential (primary) hypertension: Secondary | ICD-10-CM

## 2020-07-13 DIAGNOSIS — C61 Malignant neoplasm of prostate: Secondary | ICD-10-CM | POA: Diagnosis not present

## 2020-07-13 NOTE — Assessment & Plan Note (Signed)
At goal on valsartan 80 mg daily.  Recheck in 6 months.

## 2020-07-13 NOTE — Patient Instructions (Signed)
It was very nice to see you today!  No changes today.  Please continue to stay active and keep your mind active.  I will see you back in 6 months.  Please schedule a Medicare annual wellness visit with her health coach within the next 6 months as well.  Please come back  to see me sooner if needed.  Take care, Dr Jerline Pain  PLEASE NOTE:  If you had any lab tests please let us know if you have not heard back within a few days. You may see your results on mychart before we have a chance to review them but we will give you a call once they are reviewed by [place]. If we ordered any referrals today, please let us know if you have not heard from their office within the next week.   Please try these tips to maintain a healthy lifestyle:   Eat at least 3 REAL meals and 1-2 snacks per day.  Aim for no more than 5 hours between eating.  If you eat breakfast, please do so within one hour of getting up.    Each meal should contain half fruits/vegetables, one quarter protein, and one quarter carbs (no bigger than a computer mouse)   Cut down on sweet beverages. This includes juice, soda, and sweet tea.     Drink at least 1 glass of water with each meal and aim for at least 8 glasses per day   Exercise at least 150 minutes every week.

## 2020-07-13 NOTE — Assessment & Plan Note (Signed)
We can recheck vitamin D next blood draw.

## 2020-07-13 NOTE — Progress Notes (Signed)
   Dave Silva. is a 80 y.o. male who presents today for an office visit.  Assessment/Plan:  Chronic Problems Addressed Today: Essential hypertension At goal on valsartan 80 mg daily.  Recheck in 6 months.  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  He will come back in 6 months for annual visit to have this done.  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  Vitamin D deficiency We can recheck vitamin D next blood draw.    Subjective:  HPI:  See A/p.         Objective:  Physical Exam: BP 122/74   Pulse 74   Temp 97.6 F (36.4 C)   Ht 5\' 11"  (1.803 m)   Wt 175 lb 3.2 oz (79.5 kg)   SpO2 95%   BMI 24.44 kg/m   Gen: No acute distress, resting comfortably CV: Regular rate and rhythm with 2/6 systolic murmur appreciated Pulm: Normal work of breathing, clear to auscultation bilaterally with no crackles, wheezes, or rhonchi Neuro: Grossly normal, moves all extremities Psych: Normal affect and thought content      Veatrice Eckstein M. Jerline Pain, MD 07/13/2020 3:34 PM

## 2020-07-13 NOTE — Assessment & Plan Note (Signed)
Doing well on Zoloft 50 mg daily.

## 2020-07-13 NOTE — Assessment & Plan Note (Signed)
Need to check PSA with next blood draw.  He will come back in 6 months for annual visit to have this done.

## 2020-07-14 ENCOUNTER — Other Ambulatory Visit: Payer: Self-pay | Admitting: *Deleted

## 2020-07-14 ENCOUNTER — Ambulatory Visit (INDEPENDENT_AMBULATORY_CARE_PROVIDER_SITE_OTHER): Payer: Medicare Other | Admitting: Internal Medicine

## 2020-07-14 ENCOUNTER — Ambulatory Visit (INDEPENDENT_AMBULATORY_CARE_PROVIDER_SITE_OTHER): Payer: Medicare Other | Admitting: Primary Care

## 2020-07-14 ENCOUNTER — Encounter: Payer: Self-pay | Admitting: Primary Care

## 2020-07-14 DIAGNOSIS — I2510 Atherosclerotic heart disease of native coronary artery without angina pectoris: Secondary | ICD-10-CM | POA: Diagnosis not present

## 2020-07-14 DIAGNOSIS — J449 Chronic obstructive pulmonary disease, unspecified: Secondary | ICD-10-CM

## 2020-07-14 LAB — PULMONARY FUNCTION TEST
DL/VA % pred: 51 %
DL/VA: 2.01 ml/min/mmHg/L
DLCO cor % pred: 30 %
DLCO cor: 7.68 ml/min/mmHg
DLCO unc % pred: 30 %
DLCO unc: 7.68 ml/min/mmHg
FEF 25-75 Post: 1.62 L/sec
FEF 25-75 Pre: 1.36 L/sec
FEF2575-%Change-Post: 19 %
FEF2575-%Pred-Post: 78 %
FEF2575-%Pred-Pre: 65 %
FEV1-%Change-Post: 4 %
FEV1-%Pred-Post: 84 %
FEV1-%Pred-Pre: 80 %
FEV1-Post: 2.53 L
FEV1-Pre: 2.43 L
FEV1FVC-%Change-Post: 0 %
FEV1FVC-%Pred-Pre: 97 %
FEV6-%Change-Post: 4 %
FEV6-%Pred-Post: 92 %
FEV6-%Pred-Pre: 88 %
FEV6-Post: 3.64 L
FEV6-Pre: 3.48 L
FEV6FVC-%Change-Post: 0 %
FEV6FVC-%Pred-Post: 106 %
FEV6FVC-%Pred-Pre: 106 %
FVC-%Change-Post: 4 %
FVC-%Pred-Post: 86 %
FVC-%Pred-Pre: 83 %
FVC-Post: 3.64 L
FVC-Pre: 3.49 L
Post FEV1/FVC ratio: 69 %
Post FEV6/FVC ratio: 100 %
Pre FEV1/FVC ratio: 69 %
Pre FEV6/FVC Ratio: 100 %
RV % pred: 38 %
RV: 1.04 L
TLC % pred: 59 %
TLC: 4.35 L

## 2020-07-14 MED ORDER — SERTRALINE HCL 50 MG PO TABS: 50.0000 mg | ORAL_TABLET | Freq: Every day | ORAL | 3 refills | Status: DC

## 2020-07-14 NOTE — Telephone Encounter (Signed)
LAST APPOINTMENT DATE: 07/13/2020   NEXT APPOINTMENT DATE: 08/03/2020    Last refill by historical provied

## 2020-07-14 NOTE — Progress Notes (Signed)
@Patient  ID: Dave Silva., male    DOB: 1940/05/28, 80 y.o.   MRN: [id number]  Chief Complaint  Patient presents with  . Follow-up    PFT follow up     Referring provider: Vivi Barrack, MD  HPI: 80 year old male, former smoker quit 1987 (60 pack year hx). PMH significant for CAD s/p CABG x 4 in 2019, HTN, COPD, rhinitis, GERD, prostate cancer, vit d deficiency.  Patient of Dr. Melvyn Novas, last seen in office on 06/07/20.  07/14/2020- Interim hx  Patient presents today for PFTs. He is doing well. No acute respiratory complaints. He will occasionally experience some wheezing with exertion. He does not experience much dyspnea, very minimal with hills/stairs. He recovers quickly with rest. He is fine walking level at his own pace. He has some morning congestion/throat clearing, otherwise no significant cough. He is complaint with Stiolto. He has been on this for several years. CXR in February 2022 showed no active disease, moderate sized hiatal hernia. Denies f/c/s, shortness of breath at rest, cough, chest tightness.   Pulmonary function testing: 07/14/2020 - FVC 3.64 (86%), FEV1 2.53 (84%), ratio 69, TLC 4.35 (59%), DLCOunc 7.68 (30%) corrects to 58% with lung volumes/ Minimal obstruction without BD response. Moderate-severe diffusion defect.    No Known Allergies  Immunization History  Administered Date(s) Administered  . Influenza-Unspecified 10/13/2019  . Moderna SARS-COV2 Booster Vaccination 05/24/2020  . Moderna Sars-Covid-2 Vaccination 05/25/2019, 07/25/2019, 12/30/2019  . Pneumococcal-Unspecified 02/11/2014  . Zoster Recombinat (Shingrix) 02/11/2014    Past Medical History:  Diagnosis Date  . Asthma   . Cancer ([place])   . Cataract   . COPD (chronic obstructive pulmonary disease) ([place])   . Emphysema of lung ([place])   . Hyperlipidemia   . Hypertension     Tobacco History: Social History   Tobacco Use  Smoking Status Former Smoker  . Packs/day: 3.00  . Years: 20.00  .  Pack years: 60.00  . Types: Cigarettes  . Start date: 04/10/1965  . Quit date: 02/11/1985  . Years since quitting: 35.4  Smokeless Tobacco Never Used   Counseling given: Not Answered   Outpatient Medications Prior to Visit  Medication Sig Dispense Refill  . aspirin 81 MG EC tablet Take 81 mg by mouth daily. Swallow whole.    . Cholecalciferol (VITAMIN D3) 50 MCG (2000 UT) TABS Take 4,000 Units by mouth daily.    [place] Kitchen ezetimibe (ZETIA) 10 MG tablet Take 1 tablet (10 mg total) by mouth daily. 90 tablet 3  . fluticasone (FLONASE) 50 MCG/ACT nasal spray Place into both nostrils daily.    . montelukast (SINGULAIR) 10 MG tablet Take 10 mg by mouth at bedtime.    Marland Kitchen omeprazole (PRILOSEC) 20 MG capsule Take 20 mg by mouth daily.    . rosuvastatin (CRESTOR) 20 MG tablet Take 1 tablet (20 mg total) by mouth daily. 90 tablet 3  . sertraline (ZOLOFT) 50 MG tablet Take 1 tablet (50 mg total) by mouth daily. 90 tablet 3  . Tiotropium Bromide-Olodaterol (STIOLTO RESPIMAT) 2.5-2.5 MCG/ACT AERS Inhale 2 puffs into the lungs daily. 4 g 6  . valsartan (DIOVAN) 80 MG tablet Take 1 tablet (80 mg total) by mouth daily. 30 tablet 11   No facility-administered medications prior to visit.   Review of Systems  Review of Systems  Constitutional: Negative.   HENT: Positive for congestion.        Am congestion  Respiratory: Negative.  Dyspnea with hills/stairs  Cardiovascular: Negative.     Physical Exam  BP 122/72 (BP Location: Right Arm, Cuff Size: Normal)   Pulse 60   Temp 98 F (36.7 C) (Temporal)   Ht 5\' 11"  (1.803 m)   Wt 174 lb (78.9 kg)   SpO2 95%   BMI 24.27 kg/m  Physical Exam Constitutional:      General: He is not in acute distress.    Appearance: Normal appearance. He is not ill-appearing.     Comments: Well appearing  HENT:     Mouth/Throat:     Comments: Deferred d/t masking Cardiovascular:     Rate and Rhythm: Normal rate.  Pulmonary:     Effort: Pulmonary effort is  normal.     Breath sounds: Normal breath sounds.     Comments: CTA. No overt wheezing, rhonchi or rales Musculoskeletal:        General: Normal range of motion.  Neurological:     General: No focal deficit present.     Mental Status: He is alert and oriented to person, place, and time. Mental status is at baseline.  Psychiatric:        Mood and Affect: Mood normal.        Behavior: Behavior normal.        Thought Content: Thought content normal.        Judgment: Judgment normal.      Lab Results:  CBC    Component Value Date/Time   WBC 9.7 02/21/2020 1536   RBC 4.27 02/21/2020 1536   HGB 14.8 02/21/2020 1536   HCT 43.8 02/21/2020 1536   PLT 132 (L) 02/21/2020 1536   MCV 103 (H) 02/21/2020 1536   MCH 34.7 (H) 02/21/2020 1536   MCHC 33.8 02/21/2020 1536   RDW 12.3 02/21/2020 1536    BMET    Component Value Date/Time   NA 140 02/21/2020 1536   K 4.5 02/21/2020 1536   CL 100 02/21/2020 1536   CO2 25 02/21/2020 1536   GLUCOSE 97 02/21/2020 1536   BUN 18 02/21/2020 1536   CREATININE 0.93 02/21/2020 1536   CALCIUM 8.9 02/21/2020 1536   GFRNONAA 77 02/21/2020 1536   GFRAA 89 02/21/2020 1536    BNP No results found for: BNP  ProBNP No results found for: PROBNP  Imaging: No results found.   Assessment & Plan:   Stage 1 mild COPD by GOLD classification ([place]) - Stable interval; No acute respiratory complaints. Minimal dyspnea with stairs/hills. Morning congestion/cough. Compliant with Stiolto Respimat. No SABA use. CAT 4.  - Pulmonary function testing showed minimal obstructive airway disease without BD response, moderate diffusion defect.  Decreased diffusion capacity can be caused by emphysema or ILD (pulmonary fibrosis). Since patient is mostly asymptomatic and CXR was normal in February 2021 I would not recommend additional imaging at this time. If he develops worsening dyspnea symptoms or cough I would recommend getting high resolution CT chest  - FU in 3-6  months with Dr. Melvyn Novas or sooner if needed     [name], NP 07/14/2020

## 2020-07-14 NOTE — Assessment & Plan Note (Addendum)
-   Stable interval; No acute respiratory complaints. Minimal dyspnea with stairs/hills. Morning congestion/cough. Compliant with Stiolto Respimat. No SABA use. CAT 4.  - Pulmonary function testing showed minimal obstructive airway disease without BD response, moderate diffusion defect.  Decreased diffusion capacity can be caused by emphysema or ILD (pulmonary fibrosis). Since patient is mostly asymptomatic and CXR was normal in February 2021 I would not recommend additional imaging at this time. If he develops worsening dyspnea symptoms or cough I would recommend getting high resolution CT chest  - FU in 3-6 months with Dr. Melvyn Novas or sooner if needed

## 2020-07-14 NOTE — Patient Instructions (Signed)
Pleasure meeting you today Mr. [facility]   Pulmonary function testing showed minimal obstructive airway disease consistent with COPD, you did have moderate diffusion defect.  Decreased diffusion capacity can be caused by emphysema or ILD (pulmonary fibrosis). Since you are mostly asymptomatic and CXR was normal in February 2021 I would not recommend additional imaging at this time. If you have worsening dyspnea symptoms or cough I would recommend getting CT chest   Recommendations: Continue Stiolto Respimat two puffs once daily in the morning   Follow-up: 3-6 months with Dr. Melvyn Novas    COPD and Physical Activity Chronic obstructive pulmonary disease (COPD) is a long-term (chronic) condition that affects the lungs. COPD is a general term that can be used to describe many different lung problems that cause lung swelling (inflammation) and limit airflow, including chronic bronchitis and emphysema. The main symptom of COPD is shortness of breath, which makes it harder to do even simple tasks. This can also make it harder to exercise and be active. Talk with your health care provider about treatments to help you breathe better and actions you can take to prevent breathing problems during physical activity. What are the benefits of exercising with COPD? Exercising regularly is an important part of a healthy lifestyle. You can still exercise and do physical activities even though you have COPD. Exercise and physical activity improve your shortness of breath by increasing blood flow (circulation). This causes your heart to pump more oxygen through your body. Moderate exercise can improve your:  Oxygen use.  Energy level.  Shortness of breath.  Strength in your breathing muscles.  Heart health.  Sleep.  Self-esteem and feelings of self-worth.  Depression, stress, and anxiety levels. Exercise can benefit everyone with COPD. The severity of your disease may affect how hard you can exercise,  especially at first, but everyone can benefit. Talk with your health care provider about how much exercise is safe for you, and which activities and exercises are safe for you.   What actions can I take to prevent breathing problems during physical activity?  Sign up for a pulmonary rehabilitation program. This type of program may include: ? Education about lung diseases. ? Exercise classes that teach you how to exercise and be more active while improving your breathing. This usually involves:  Exercise using your lower extremities, such as a stationary bicycle.  About 30 minutes of exercise, 2 to 5 times per week, for 6 to 12 weeks  Strength training, such as push ups or leg lifts. ? Nutrition education. ? Group classes in which you can talk with others who also have COPD and learn ways to manage stress.  If you use an oxygen tank, you should use it while you exercise. Work with your health care provider to adjust your oxygen for your physical activity. Your resting flow rate is different from your flow rate during physical activity.  While you are exercising: ? Take slow breaths. ? Pace yourself and do not try to go too fast. ? Purse your lips while breathing out. Pursing your lips is similar to a kissing or whistling position. ? If doing exercise that uses a quick burst of effort, such as weight lifting:  Breathe in before starting the exercise.  Breathe out during the hardest part of the exercise (such as raising the weights). Where to find support You can find support for exercising with COPD from:  Your health care provider.  A pulmonary rehabilitation program.  Your local health department or community  health programs.  Support groups, online or in-person. Your health care provider may be able to recommend support groups. Where to find more information You can find more information about exercising with COPD from:  American Lung Association: ClassInsider.se.  COPD Foundation:  https://www.rivera.net/. Contact a health care provider if:  Your symptoms get worse.  You have chest pain.  You have nausea.  You have a fever.  You have trouble talking or catching your breath.  You want to start a new exercise program or a new activity. Summary  COPD is a general term that can be used to describe many different lung problems that cause lung swelling (inflammation) and limit airflow. This includes chronic bronchitis and emphysema.  Exercise and physical activity improve your shortness of breath by increasing blood flow (circulation). This causes your heart to provide more oxygen to your body.  Contact your health care provider before starting any exercise program or new activity. Ask your health care provider what exercises and activities are safe for you. This information is not intended to replace advice given to you by your health care provider. Make sure you discuss any questions you have with your health care provider. Document Revised: 05/20/2018 Document Reviewed: 02/20/2017 Elsevier Patient Education  2021 [employer].

## 2020-07-14 NOTE — Patient Instructions (Signed)
Full PFT performed today. °

## 2020-07-14 NOTE — Progress Notes (Signed)
Full PFT performed today. °

## 2020-08-03 ENCOUNTER — Ambulatory Visit: Payer: Medicare Other

## 2020-09-27 ENCOUNTER — Other Ambulatory Visit: Payer: Self-pay | Admitting: Physician Assistant

## 2020-09-27 DIAGNOSIS — R413 Other amnesia: Secondary | ICD-10-CM

## 2020-09-27 DIAGNOSIS — Z820 Family history of epilepsy and other diseases of the nervous system: Secondary | ICD-10-CM

## 2020-10-09 ENCOUNTER — Telehealth: Payer: Self-pay

## 2020-10-09 NOTE — Telephone Encounter (Signed)
Noted  

## 2020-10-09 NOTE — Telephone Encounter (Signed)
Received a message that the neurologist needs office notes in order to proceed with scheduling the referral. Called patients son and advised that Dave Silva will need an appointment with Dr.Parker to which they stated Dave Silva is currently in [place] until the end of October. Will call back when he is back in [place].

## 2020-10-10 ENCOUNTER — Telehealth: Payer: Self-pay

## 2020-10-10 MED ORDER — OMEPRAZOLE 20 MG PO CPDR: 20.0000 mg | DELAYED_RELEASE_CAPSULE | Freq: Every day | ORAL | 0 refills | Status: DC

## 2020-10-10 NOTE — Telephone Encounter (Signed)
Pt's daughter in law called asking if Dr Jerline Pain can refill Omeprazole for Elenore Rota. She stated that Dr Jerline Pain was not the one that filled it the last time but wanted to know if Dr Jerline Pain can fill it. Best number is [phone]. Would like prescription to be called into [facility] [address] [place] [address]. Please Advise.

## 2020-10-13 MED ORDER — OMEPRAZOLE 20 MG PO CPDR: 20.0000 mg | DELAYED_RELEASE_CAPSULE | Freq: Every day | ORAL | 0 refills | Status: DC

## 2020-10-13 NOTE — Telephone Encounter (Signed)
Patients daughter in law called in asking for an update, prescription was [facility]. When [facility] needs [facility] to [facility] [address], [place] [address].

## 2020-10-13 NOTE — Telephone Encounter (Signed)
Rx has been filled to correct pharmacy, called and lm making Anderson Malta aware.

## 2020-10-13 NOTE — Addendum Note (Signed)
Addended by: Clyde Lundborg A on: 10/13/2020 02:37 PM   Modules accepted: Orders

## 2021-01-04 ENCOUNTER — Other Ambulatory Visit: Payer: Self-pay | Admitting: Family Medicine

## 2021-01-12 ENCOUNTER — Ambulatory Visit: Payer: Medicare Other | Admitting: Family Medicine

## 2021-01-15 ENCOUNTER — Ambulatory Visit: Payer: Medicare Other | Admitting: Internal Medicine

## 2021-02-15 ENCOUNTER — Ambulatory Visit: Admit: 2021-02-15 | Payer: MEDICARE | Attending: Internal Medicine | Primary: Internal Medicine

## 2021-02-15 ENCOUNTER — Encounter: Admit: 2021-02-15 | Payer: PRIVATE HEALTH INSURANCE | Attending: Internal Medicine | Primary: Internal Medicine

## 2021-02-15 ENCOUNTER — Inpatient Hospital Stay: Admit: 2021-02-15 | Discharge: 2021-02-15 | Payer: MEDICARE | Primary: Internal Medicine

## 2021-02-15 LAB — COMPREHENSIVE METABOLIC PANEL
BKR A/G RATIO: 1.5 (ref 1.0–2.2)
BKR ALANINE AMINOTRANSFERASE (ALT): 14 U/L (ref 9–59)
BKR ALBUMIN: 4.2 g/dL (ref 3.6–4.9)
BKR ALKALINE PHOSPHATASE: 83 U/L (ref 9–122)
BKR ANION GAP: 12 (ref 7–17)
BKR ASPARTATE AMINOTRANSFERASE (AST): 32 U/L (ref 10–35)
BKR AST/ALT RATIO: 2.3
BKR BILIRUBIN TOTAL: 0.5 mg/dL (ref <=1.2)
BKR BLOOD UREA NITROGEN: 11 mg/dL (ref 8–23)
BKR BUN / CREAT RATIO: 8.2 (ref 8.0–23.0)
BKR CALCIUM: 9.2 mg/dL (ref 8.8–10.2)
BKR CHLORIDE: 101 mmol/L (ref 98–107)
BKR CO2: 26 mmol/L (ref 20–30)
BKR CREATININE: 1.34 mg/dL — ABNORMAL HIGH (ref 0.40–1.30)
BKR EGFR, CREATININE (CKD-EPI 2021): 54 mL/min/{1.73_m2} — ABNORMAL LOW (ref >=60)
BKR GLOBULIN: 2.8 g/dL (ref 2.3–3.5)
BKR GLUCOSE: 108 mg/dL — ABNORMAL HIGH (ref 70–100)
BKR POTASSIUM: 4.3 mmol/L (ref 3.3–5.3)
BKR PROTEIN TOTAL: 7 g/dL (ref 6.6–8.7)
BKR SODIUM: 139 mmol/L (ref 136–144)

## 2021-02-15 LAB — LIPID PANEL
BKR CHOLESTEROL/HDL RATIO: 2.3 (ref 0.0–5.0)
BKR CHOLESTEROL: 176 mg/dL
BKR HDL CHOLESTEROL: 77 mg/dL (ref >=40)
BKR LDL CHOLESTEROL SAMPSON CALCULATED: 86 mg/dL
BKR TRIGLYCERIDES: 71 mg/dL

## 2021-02-15 MED ORDER — ALBUTEROL SULFATE HFA 90 MCG/ACTUATION AEROSOL INHALER
90 | RESPIRATORY_TRACT | 2.00 refills | 25.00000 days | Status: AC
Start: 2021-02-15 — End: 2022-02-13

## 2021-02-15 MED ORDER — OMEPRAZOLE 20 MG CAPSULE,DELAYED RELEASE
20 | ORAL | 2.00 refills | 90.00000 days | Status: AC
Start: 2021-02-15 — End: 2021-05-02

## 2021-02-15 MED ORDER — STIOLTO RESPIMAT 2.5 MCG-2.5 MCG/ACTUATION SOLUTION FOR INHALATION
2.5-2.5 | Freq: Every day | RESPIRATORY_TRACT | 12 refills | 90.00000 days | Status: AC
Start: 2021-02-15 — End: 2021-04-17

## 2021-02-15 MED ORDER — ASPIRIN 81 MG TABLET,DELAYED RELEASE
81 | ORAL | 0.00 refills | 90.00000 days | Status: AC
Start: 2021-02-15 — End: ?

## 2021-02-15 MED ORDER — ROSUVASTATIN 20 MG TABLET
20 | ORAL | 4.00 refills | 90.00000 days | Status: AC
Start: 2021-02-15 — End: 2021-12-04

## 2021-02-15 MED ORDER — EZETIMIBE 10 MG TABLET
10 | ORAL | 4.00 refills | 90.00000 days | Status: AC
Start: 2021-02-15 — End: 2021-11-16

## 2021-02-15 MED ORDER — SERTRALINE 50 MG TABLET
50 | ORAL | 2.00 refills | 90.00000 days | Status: AC
Start: 2021-02-15 — End: 2022-06-22

## 2021-02-15 MED ORDER — MONTELUKAST 10 MG TABLET
10 | ORAL | 3.00 refills | 90.00000 days | Status: AC
Start: 2021-02-15 — End: 2021-04-03

## 2021-02-15 MED ORDER — VALSARTAN 80 MG TABLET
80 | Freq: Every day | ORAL | 4.00 refills | 90.00000 days | Status: AC
Start: 2021-02-15 — End: 2021-05-02

## 2021-02-19 ENCOUNTER — Encounter: Admit: 2021-02-19 | Payer: PRIVATE HEALTH INSURANCE | Attending: Internal Medicine | Primary: Internal Medicine

## 2021-04-03 ENCOUNTER — Encounter: Admit: 2021-04-03 | Payer: PRIVATE HEALTH INSURANCE | Attending: Internal Medicine | Primary: Internal Medicine

## 2021-04-03 MED ORDER — MONTELUKAST 10 MG TABLET
10 | ORAL_TABLET | Freq: Every evening | ORAL | 3 refills | 90.00000 days | Status: AC
Start: 2021-04-03 — End: 2021-04-17

## 2021-04-17 ENCOUNTER — Telehealth: Payer: Self-pay | Admitting: Family Medicine

## 2021-04-17 ENCOUNTER — Encounter: Admit: 2021-04-17 | Payer: PRIVATE HEALTH INSURANCE | Attending: Internal Medicine | Primary: Internal Medicine

## 2021-04-17 MED ORDER — MONTELUKAST 10 MG TABLET
10 | ORAL_TABLET | Freq: Every evening | ORAL | 3 refills | 90.00000 days | Status: AC
Start: 2021-04-17 — End: 2021-07-17

## 2021-04-17 MED ORDER — STIOLTO RESPIMAT 2.5 MCG-2.5 MCG/ACTUATION SOLUTION FOR INHALATION
2.5-2.5 | Freq: Every day | RESPIRATORY_TRACT | 12 refills | 90.00000 days | Status: AC
Start: 2021-04-17 — End: 2022-05-15

## 2021-04-17 NOTE — Telephone Encounter (Signed)
Attempted to schedule AWV. Unable to LVM.  Will try at later time.  

## 2021-05-02 ENCOUNTER — Encounter: Admit: 2021-05-02 | Payer: PRIVATE HEALTH INSURANCE | Attending: Internal Medicine | Primary: Internal Medicine

## 2021-05-02 MED ORDER — VALSARTAN 80 MG TABLET
80 | ORAL_TABLET | Freq: Every day | ORAL | 3 refills | 90.00000 days | Status: AC
Start: 2021-05-02 — End: 2021-11-07

## 2021-05-02 MED ORDER — OMEPRAZOLE 20 MG CAPSULE,DELAYED RELEASE
20 | ORAL_CAPSULE | Freq: Every day | ORAL | 7 refills | 90.00000 days | Status: AC
Start: 2021-05-02 — End: 2022-01-18

## 2021-07-17 ENCOUNTER — Encounter: Admit: 2021-07-17 | Payer: PRIVATE HEALTH INSURANCE | Attending: Internal Medicine | Primary: Internal Medicine

## 2021-07-17 MED ORDER — MONTELUKAST 10 MG TABLET
10 | ORAL_TABLET | Freq: Every evening | ORAL | 2 refills | 90.00000 days | Status: AC
Start: 2021-07-17 — End: 2021-11-07

## 2021-08-15 ENCOUNTER — Encounter: Admit: 2021-08-15 | Payer: PRIVATE HEALTH INSURANCE | Attending: Internal Medicine | Primary: Internal Medicine

## 2021-08-15 ENCOUNTER — Encounter: Admit: 2021-08-15 | Payer: MEDICARE | Attending: Internal Medicine | Primary: Internal Medicine

## 2021-08-17 ENCOUNTER — Encounter: Admit: 2021-08-17 | Payer: PRIVATE HEALTH INSURANCE | Attending: Internal Medicine | Primary: Internal Medicine

## 2021-08-22 ENCOUNTER — Encounter: Admit: 2021-08-22 | Payer: PRIVATE HEALTH INSURANCE | Attending: Internal Medicine | Primary: Internal Medicine

## 2021-10-30 ENCOUNTER — Encounter: Admit: 2021-10-30 | Payer: PRIVATE HEALTH INSURANCE | Attending: Internal Medicine | Primary: Internal Medicine

## 2021-11-07 MED ORDER — VALSARTAN 80 MG TABLET
80 | ORAL_TABLET | Freq: Every day | ORAL | 1 refills | 90.00000 days | Status: AC
Start: 2021-11-07 — End: ?

## 2021-11-07 MED ORDER — MONTELUKAST 10 MG TABLET
10 | ORAL_TABLET | Freq: Every evening | ORAL | 1 refills | 90.00000 days | Status: AC
Start: 2021-11-07 — End: 2023-02-11

## 2021-11-09 ENCOUNTER — Encounter: Admit: 2021-11-09 | Payer: PRIVATE HEALTH INSURANCE | Attending: Internal Medicine | Primary: Internal Medicine

## 2021-11-15 ENCOUNTER — Encounter: Admit: 2021-11-15 | Payer: PRIVATE HEALTH INSURANCE | Attending: Internal Medicine | Primary: Internal Medicine

## 2021-11-16 MED ORDER — EZETIMIBE 10 MG TABLET
10 | ORAL_TABLET | Freq: Every day | ORAL | 1 refills | 90.00000 days | Status: AC
Start: 2021-11-16 — End: 2022-06-22

## 2021-11-22 ENCOUNTER — Encounter: Admit: 2021-11-22 | Payer: PRIVATE HEALTH INSURANCE | Attending: Internal Medicine | Primary: Internal Medicine

## 2021-12-03 ENCOUNTER — Encounter: Admit: 2021-12-03 | Payer: PRIVATE HEALTH INSURANCE | Attending: Internal Medicine | Primary: Internal Medicine

## 2021-12-04 MED ORDER — ROSUVASTATIN 20 MG TABLET
20 | ORAL_TABLET | Freq: Every day | ORAL | 2 refills | 90.00000 days | Status: AC
Start: 2021-12-04 — End: 2022-03-12

## 2022-01-17 ENCOUNTER — Encounter: Admit: 2022-01-17 | Payer: PRIVATE HEALTH INSURANCE | Attending: Internal Medicine | Primary: Internal Medicine

## 2022-01-18 MED ORDER — OMEPRAZOLE 20 MG CAPSULE,DELAYED RELEASE
20 | ORAL_CAPSULE | Freq: Every day | ORAL | 7 refills | 90.00000 days | Status: AC
Start: 2022-01-18 — End: 2022-06-22

## 2022-02-07 ENCOUNTER — Ambulatory Visit: Admit: 2022-02-07 | Payer: MEDICARE | Attending: Diagnostic Radiology | Primary: Internal Medicine

## 2022-02-07 ENCOUNTER — Ambulatory Visit: Admit: 2022-02-07 | Payer: PRIVATE HEALTH INSURANCE | Primary: Internal Medicine

## 2022-02-07 ENCOUNTER — Encounter: Admit: 2022-02-07 | Payer: PRIVATE HEALTH INSURANCE | Primary: Internal Medicine

## 2022-02-07 ENCOUNTER — Inpatient Hospital Stay
Admit: 2022-02-07 | Discharge: 2022-02-13 | Payer: MEDICARE | Attending: Internal Medicine | Admitting: Internal Medicine

## 2022-02-07 DIAGNOSIS — U071 COVID-19: Secondary | ICD-10-CM

## 2022-02-07 LAB — CBC WITH AUTO DIFFERENTIAL
BKR WAM ABSOLUTE IMMATURE GRANULOCYTES.: 0.02 x 1000/ÂµL (ref 0.00–0.30)
BKR WAM ABSOLUTE LYMPHOCYTE COUNT.: 1.87 x 1000/ÂµL (ref 0.60–3.70)
BKR WAM ABSOLUTE NRBC (2 DEC): 0 x 1000/ÂµL (ref 0.00–1.00)
BKR WAM ANALYZER ANC: 5.06 x 1000/ÂµL (ref 2.00–7.60)
BKR WAM BASOPHIL ABSOLUTE COUNT.: 0.05 x 1000/ÂµL (ref 0.00–1.00)
BKR WAM BASOPHILS: 0.6 % (ref 0.0–1.4)
BKR WAM EOSINOPHIL ABSOLUTE COUNT.: 0.05 x 1000/ÂµL (ref 0.00–1.00)
BKR WAM EOSINOPHILS: 0.6 % (ref 0.0–5.0)
BKR WAM HEMATOCRIT (2 DEC): 41.8 % (ref 38.50–50.00)
BKR WAM HEMOGLOBIN: 14.3 g/dL (ref 13.2–17.1)
BKR WAM IMMATURE GRANULOCYTES: 0.2 % (ref 0.0–1.0)
BKR WAM LYMPHOCYTES: 22 % (ref 17.0–50.0)
BKR WAM MCH (PG): 35.8 pg — ABNORMAL HIGH (ref 27.0–33.0)
BKR WAM MCHC: 34.2 g/dL (ref 31.0–36.0)
BKR WAM MCV: 104.8 fL — ABNORMAL HIGH (ref 80.0–100.0)
BKR WAM MONOCYTE ABSOLUTE COUNT.: 1.45 x 1000/ÂµL — ABNORMAL HIGH (ref 0.00–1.00)
BKR WAM MONOCYTES: 17.1 % — ABNORMAL HIGH (ref 4.0–12.0)
BKR WAM MPV: 11.8 fL (ref 8.0–12.0)
BKR WAM NEUTROPHILS: 59.5 % (ref 39.0–72.0)
BKR WAM NUCLEATED RED BLOOD CELLS: 0 % (ref 0.0–1.0)
BKR WAM PLATELETS: 123 x1000/ÂµL — ABNORMAL LOW (ref 150–420)
BKR WAM RDW-CV: 13.1 % (ref 11.0–15.0)
BKR WAM RED BLOOD CELL COUNT.: 3.99 M/ÂµL — ABNORMAL LOW (ref 4.00–6.00)
BKR WAM WHITE BLOOD CELL COUNT: 8.5 x1000/ÂµL (ref 4.0–11.0)

## 2022-02-07 LAB — BLOOD GAS, VENOUS (BH YH)
BKR BASE EXCESS, VENOUS: 2 mmol/L
BKR CALCULATED HCO3, VENOUS: 27.5 mmol/L
BKR O2 SATURATION VENOUS: 56 %
BKR PCO2, VENOUS: 47 mmHg (ref 38–54)
BKR PH, VENOUS: 7.38 U (ref 7.32–7.43)
BKR PO2, VENOUS: 31 mmHg — ABNORMAL LOW (ref 40–50)
BKR TEMPERATURE (VENOUS BG COLL QUESTION): 99.3 [degF]

## 2022-02-07 LAB — PROTIME AND INR
BKR INR: 1.02 (ref 0.93–1.07)
BKR PROTHROMBIN TIME: 10.3 s (ref 9.5–12.1)

## 2022-02-07 LAB — COMPREHENSIVE METABOLIC PANEL
BKR A/G RATIO: 0.8 — ABNORMAL LOW (ref 1.0–2.2)
BKR ALANINE AMINOTRANSFERASE (ALT): 18 U/L (ref 14–63)
BKR ALBUMIN: 2.7 g/dL — ABNORMAL LOW (ref 3.4–5.0)
BKR ALKALINE PHOSPHATASE: 62 U/L (ref 46–116)
BKR ANION GAP: 9 (ref 5–16)
BKR ASPARTATE AMINOTRANSFERASE (AST): 20 U/L (ref 10–42)
BKR AST/ALT RATIO: 1.1
BKR BILIRUBIN TOTAL: 0.6 mg/dL (ref 0.2–1.2)
BKR BLOOD UREA NITROGEN: 18 mg/dL (ref 7–18)
BKR BUN / CREAT RATIO: 11.6 (ref 8.0–23.0)
BKR CALCIUM: 7.9 mg/dL — ABNORMAL LOW (ref 8.5–10.5)
BKR CHLORIDE: 101 mmol/L (ref 98–107)
BKR CO2: 26 mmol/L (ref 21–32)
BKR CREATININE: 1.55 mg/dL — ABNORMAL HIGH (ref 0.80–1.30)
BKR EGFR, CREATININE (CKD-EPI 2021): 45 mL/min/{1.73_m2} — ABNORMAL LOW (ref >=60)
BKR GLOBULIN: 3.3 g/dL (ref 2.3–3.5)
BKR GLUCOSE: 134 mg/dL — ABNORMAL HIGH (ref 70–100)
BKR POTASSIUM: 3.7 mmol/L (ref 3.5–5.1)
BKR PROTEIN TOTAL: 6 g/dL (ref 6.0–8.0)
BKR SODIUM: 136 mmol/L (ref 136–145)

## 2022-02-07 LAB — SARS COV-2 (COVID-19) RNA: BKR SARS-COV-2 RNA (COVID-19) (YH): POSITIVE — AB

## 2022-02-07 LAB — MAGNESIUM: BKR MAGNESIUM: 1.5 mg/dL — ABNORMAL LOW (ref 1.8–2.5)

## 2022-02-07 LAB — INFLUENZA A+B/RSV BY RT-PCR
BKR INFLUENZA A: NEGATIVE
BKR INFLUENZA B: NEGATIVE
BKR RESPIRATORY SYNCYTIAL VIRUS: NEGATIVE

## 2022-02-07 LAB — LACTIC ACID, WHOLE BLOOD, VENOUS (REFLEX 2H REPEAT) (MC)
BKR LACTIC ACID, BLOOD GAS VENOUS (MC): 1.4 mmol/L (ref 0.6–1.4)
BKR LACTIC ACID, BLOOD GAS VENOUS (MC): 2.6 mmol/L — ABNORMAL HIGH (ref 0.6–1.4)

## 2022-02-07 LAB — TROPONIN T HIGH SENSITIVITY, 0 HOUR BASELINE WITH REFLEX (BH GH LMW YH): BKR TROPONIN T HS 0 HOUR BASELINE: 19 ng/L — ABNORMAL HIGH

## 2022-02-07 MED ORDER — SODIUM CHLORIDE 0.9 % (FLUSH) INJECTION SYRINGE
0.9 % | INTRAVENOUS | Status: AC | PRN
Start: 2022-02-07 — End: ?

## 2022-02-07 MED ORDER — ALBUTEROL SULFATE HFA 90 MCG/ACTUATION AEROSOL INHALER
90 mcg/actuation | Freq: Four times a day (QID) | RESPIRATORY_TRACT | Status: AC | PRN
Start: 2022-02-07 — End: ?

## 2022-02-07 MED ORDER — PREDNISONE 20 MG TABLET
20 mg | Freq: Once | ORAL | Status: CP
Start: 2022-02-07 — End: ?
  Administered 2022-02-07: 22:00:00 20 mg via ORAL

## 2022-02-07 MED ORDER — ROSUVASTATIN 20 MG TABLET
20 mg | Freq: Every day | ORAL | Status: CP
Start: 2022-02-07 — End: ?
  Administered 2022-02-08 – 2022-02-13 (×6): 20 mg via ORAL

## 2022-02-07 MED ORDER — ACETAMINOPHEN 325 MG TABLET
325 mg | Freq: Four times a day (QID) | ORAL | Status: AC | PRN
Start: 2022-02-07 — End: ?

## 2022-02-07 MED ORDER — REMDESIVIR 100 MG POWDER IN 250 ML NS IVPB (VIALMATE)
INTRAVENOUS | Status: CP
Start: 2022-02-07 — End: ?
  Administered 2022-02-09 – 2022-02-12 (×4): 250.000 mL/h via INTRAVENOUS

## 2022-02-07 MED ORDER — FAMOTIDINE 20 MG TABLET
20 mg | Freq: Every day | ORAL | Status: CP
Start: 2022-02-07 — End: ?
  Administered 2022-02-08 – 2022-02-13 (×6): 20 mg via ORAL

## 2022-02-07 MED ORDER — SODIUM CHLORIDE 0.9 % (FLUSH) INJECTION SYRINGE
0.9 % | Freq: Three times a day (TID) | INTRAVENOUS | Status: AC
Start: 2022-02-07 — End: ?
  Administered 2022-02-08 – 2022-02-12 (×4): 0.9 mL via INTRAVENOUS

## 2022-02-07 MED ORDER — ONDANSETRON 4 MG DISINTEGRATING TABLET
4 mg | Freq: Four times a day (QID) | ORAL | Status: AC | PRN
Start: 2022-02-07 — End: ?

## 2022-02-07 MED ORDER — LOSARTAN 50 MG TABLET
50 mg | Freq: Every day | ORAL | Status: CP
Start: 2022-02-07 — End: ?
  Administered 2022-02-08 – 2022-02-13 (×6): 50 mg via ORAL

## 2022-02-07 MED ORDER — SODIUM CHLORIDE 0.9 % INTRAVENOUS SOLUTION
INTRAVENOUS | Status: AC
Start: 2022-02-07 — End: ?
  Administered 2022-02-08 – 2022-02-13 (×9): via INTRAVENOUS

## 2022-02-07 MED ORDER — ENOXAPARIN 40 MG/0.4 ML SUBCUTANEOUS SYRINGE
400.4 mg/0.4 mL | SUBCUTANEOUS | Status: CP
Start: 2022-02-07 — End: ?
  Administered 2022-02-08 – 2022-02-13 (×6): 40 mg/0.4 mL via SUBCUTANEOUS

## 2022-02-07 MED ORDER — REMDESIVIR 200 MG POWDER IN 250 ML NS IVPB
Freq: Once | INTRAVENOUS | Status: CP
Start: 2022-02-07 — End: ?
  Administered 2022-02-08: 03:00:00 250.000 mL/h via INTRAVENOUS

## 2022-02-07 MED ORDER — IPRATROPIUM 0.5 MG-ALBUTEROL 3 MG (2.5 MG BASE)/3 ML NEBULIZATION SOLN
0.5 mg-3 mg(2.5 mg base)/3 mL | RESPIRATORY_TRACT | Status: CP
Start: 2022-02-07 — End: ?
  Administered 2022-02-07 (×3): 0.5 mL via RESPIRATORY_TRACT

## 2022-02-07 MED ORDER — IOHEXOL 350 MG IODINE/ML INTRAVENOUS SOLUTION
350 mg iodine/mL | Freq: Once | INTRAVENOUS | Status: CP | PRN
Start: 2022-02-07 — End: ?
  Administered 2022-02-07: 23:00:00 350 mL via INTRAVENOUS

## 2022-02-07 MED ORDER — ONDANSETRON HCL (PF) 4 MG/2 ML INJECTION SOLUTION
42 mg/2 mL | Freq: Four times a day (QID) | INTRAVENOUS | Status: AC | PRN
Start: 2022-02-07 — End: ?

## 2022-02-07 MED ORDER — TIOTROPIUM 2.5 MCG-OLODATEROL 2.5 MCG/ACTUATION MIST FOR INHALATION
2.5-2.5 mcg/actuation | Freq: Every day | RESPIRATORY_TRACT | Status: DC
Start: 2022-02-07 — End: 2022-02-08

## 2022-02-07 MED ORDER — MONTELUKAST 10 MG TABLET
10 mg | Freq: Every evening | ORAL | Status: CP
Start: 2022-02-07 — End: ?
  Administered 2022-02-08 – 2022-02-13 (×5): 10 mg via ORAL

## 2022-02-07 MED ORDER — POLYETHYLENE GLYCOL 3350 17 GRAM ORAL POWDER PACKET
17 gram | Freq: Every evening | ORAL | Status: AC | PRN
Start: 2022-02-07 — End: ?

## 2022-02-07 MED ORDER — MAGNESIUM SULFATE 2 GRAM/50 ML (4 %) IN WATER INTRAVENOUS PIGGYBACK
2 gram/50 mL (4 %) | Freq: Once | INTRAVENOUS | Status: CP
Start: 2022-02-07 — End: ?
  Administered 2022-02-08: 01:00:00 2 mL/h via INTRAVENOUS

## 2022-02-07 MED ORDER — SERTRALINE 50 MG TABLET
50 mg | Freq: Every day | ORAL | Status: CP
Start: 2022-02-07 — End: ?
  Administered 2022-02-08 – 2022-02-13 (×6): 50 mg via ORAL

## 2022-02-07 MED ORDER — SODIUM CHLORIDE 0.9 % IV BOLUS FROM BAG
0.9 % | INTRAVENOUS | Status: CP
Start: 2022-02-07 — End: ?
  Administered 2022-02-08: 01:00:00 0.9 mL/h via INTRAVENOUS

## 2022-02-07 MED ORDER — DEXAMETHASONE SODIUM PHOSPHATE (PF) 10 MG/ML INJECTION SOLUTION
10 mg/mL | INTRAVENOUS | Status: DC
Start: 2022-02-07 — End: 2022-02-08
  Administered 2022-02-08: 02:00:00 10 mL via INTRAVENOUS

## 2022-02-07 MED ORDER — ASPIRIN 81 MG TABLET,DELAYED RELEASE
81 mg | Freq: Every day | ORAL | Status: CP
Start: 2022-02-07 — End: ?
  Administered 2022-02-08 – 2022-02-13 (×6): 81 mg via ORAL

## 2022-02-08 ENCOUNTER — Encounter
Admit: 2022-02-08 | Payer: PRIVATE HEALTH INSURANCE | Attending: Student in an Organized Health Care Education/Training Program | Primary: Internal Medicine

## 2022-02-08 LAB — URINE MICROSCOPIC     (BH GH LMW YH)

## 2022-02-08 LAB — BASIC METABOLIC PANEL
BKR ANION GAP: 10 (ref 5–16)
BKR BLOOD UREA NITROGEN: 20 mg/dL — ABNORMAL HIGH (ref 7–18)
BKR BUN / CREAT RATIO: 13.4 (ref 8.0–23.0)
BKR CALCIUM: 8.5 mg/dL (ref 8.5–10.5)
BKR CHLORIDE: 100 mmol/L (ref 98–107)
BKR CO2: 25 mmol/L (ref 21–32)
BKR CREATININE: 1.49 mg/dL — ABNORMAL HIGH (ref 0.80–1.30)
BKR EGFR, CREATININE (CKD-EPI 2021): 47 mL/min/{1.73_m2} — ABNORMAL LOW (ref >=60)
BKR GLUCOSE: 159 mg/dL — ABNORMAL HIGH (ref 70–100)
BKR POTASSIUM: 4.8 mmol/L (ref 3.5–5.1)
BKR SODIUM: 135 mmol/L — ABNORMAL LOW (ref 136–145)

## 2022-02-08 LAB — URINALYSIS WITH CULTURE REFLEX      (BH LMW YH)
BKR BILIRUBIN, UA: NEGATIVE
BKR LEUKOCYTE ESTERASE, UA: NEGATIVE
BKR NITRITE, UA: NEGATIVE
BKR PH, UA: 6.5 (ref 5.5–7.5)
BKR SPECIFIC GRAVITY, UA: 1.015 (ref 1.005–1.030)
BKR UROBILINOGEN, UA (MG/DL): <2 mg/dL (ref <=2.0)

## 2022-02-08 LAB — CBC WITH AUTO DIFFERENTIAL
BKR WAM ABSOLUTE IMMATURE GRANULOCYTES.: 0.05 x 1000/ÂµL (ref 0.00–0.30)
BKR WAM ABSOLUTE LYMPHOCYTE COUNT.: 0.54 x 1000/ÂµL — ABNORMAL LOW (ref 0.60–3.70)
BKR WAM ABSOLUTE NRBC (2 DEC): 0 x 1000/ÂµL (ref 0.00–1.00)
BKR WAM ANALYZER ANC: 4.72 x 1000/ÂµL (ref 2.00–7.60)
BKR WAM BASOPHIL ABSOLUTE COUNT.: 0.01 x 1000/ÂµL (ref 0.00–1.00)
BKR WAM BASOPHILS: 0.2 % (ref 0.0–1.4)
BKR WAM EOSINOPHIL ABSOLUTE COUNT.: 0 x 1000/ÂµL (ref 0.00–1.00)
BKR WAM EOSINOPHILS: 0 % (ref 0.0–5.0)
BKR WAM HEMATOCRIT (2 DEC): 41 % (ref 38.50–50.00)
BKR WAM HEMOGLOBIN: 14.1 g/dL (ref 13.2–17.1)
BKR WAM IMMATURE GRANULOCYTES: 0.9 % (ref 0.0–1.0)
BKR WAM LYMPHOCYTES: 9.8 % — ABNORMAL LOW (ref 17.0–50.0)
BKR WAM MCH (PG): 35.4 pg — ABNORMAL HIGH (ref 27.0–33.0)
BKR WAM MCHC: 34.4 g/dL (ref 31.0–36.0)
BKR WAM MCV: 103 fL — ABNORMAL HIGH (ref 80.0–100.0)
BKR WAM MONOCYTE ABSOLUTE COUNT.: 0.2 x 1000/ÂµL (ref 0.00–1.00)
BKR WAM MONOCYTES: 3.6 % — ABNORMAL LOW (ref 4.0–12.0)
BKR WAM MPV: 11.4 fL (ref 8.0–12.0)
BKR WAM NEUTROPHILS: 85.5 % — ABNORMAL HIGH (ref 39.0–72.0)
BKR WAM NUCLEATED RED BLOOD CELLS: 0 % (ref 0.0–1.0)
BKR WAM PLATELETS: 134 x1000/ÂµL — ABNORMAL LOW (ref 150–420)
BKR WAM RDW-CV: 12.7 % (ref 11.0–15.0)
BKR WAM RED BLOOD CELL COUNT.: 3.98 M/ÂµL — ABNORMAL LOW (ref 4.00–6.00)
BKR WAM WHITE BLOOD CELL COUNT: 5.5 x1000/ÂµL (ref 4.0–11.0)

## 2022-02-08 LAB — REFLEX LACTIC ACID, WHOLE BLOOD (VENOUS): BKR LACTIC ACID, BLOOD GAS VENOUS (MC): 2.7 mmol/L — ABNORMAL HIGH (ref 0.6–1.4)

## 2022-02-08 LAB — UA REFLEX CULTURE

## 2022-02-08 LAB — TROPONIN T HIGH SENSITIVITY, 1 HOUR WITH REFLEX (BH GH LMW YH)
BKR TROPONIN T HS 1 HOUR DELTA FROM 0 HOUR: 1 ng/L
BKR TROPONIN T HS 1 HOUR: 20 ng/L — ABNORMAL HIGH

## 2022-02-08 LAB — PROCALCITONIN     (BH GH LMW Q YH): BKR PROCALCITONIN: 0.09 ng/mL

## 2022-02-08 LAB — MAGNESIUM: BKR MAGNESIUM: 2 mg/dL (ref 1.8–2.5)

## 2022-02-08 LAB — TROPONIN T HIGH SENSITIVITY, 3 HOUR (BH GH LMW YH)
BKR TROPONIN T HS 1 HOUR DELTA FROM 0 HOUR ON 3HR: 1 ng/L
BKR TROPONIN T HS 3 HOUR DELTA FROM 0 HOUR: 0 ng/L
BKR TROPONIN T HS 3 HOUR: 19 ng/L — ABNORMAL HIGH

## 2022-02-08 LAB — LACTIC ACID, WHOLE BLOOD (VENOUS)  (MC): BKR LACTIC ACID, BLOOD GAS VENOUS (MC): 3.2 mmol/L — ABNORMAL HIGH (ref 0.6–1.4)

## 2022-02-08 LAB — NT-PROBNPE: BKR B-TYPE NATRIURETIC PEPTIDE, PRO (PROBNP): 185.7 pg/mL (ref <450.0)

## 2022-02-08 MED ORDER — TIOTROPIUM BROMIDE 2.5 MCG/ACTUATION MIST FOR INHALATION
2.5 mcg/actuation | Freq: Every day | RESPIRATORY_TRACT | Status: CP
Start: 2022-02-08 — End: ?
  Administered 2022-02-11 – 2022-02-13 (×3): 2.5 mcg/actuation via RESPIRATORY_TRACT

## 2022-02-08 MED ORDER — FOLIC ACID 1 MG TABLET
1 mg | Freq: Every day | ORAL | Status: CP
Start: 2022-02-08 — End: ?
  Administered 2022-02-09 – 2022-02-13 (×5): 1 mg via ORAL

## 2022-02-08 MED ORDER — ZZ IMS TEMPLATE
ORAL | Status: CP
Start: 2022-02-08 — End: ?
  Administered 2022-02-09 – 2022-02-13 (×5): 0.5 mg via ORAL

## 2022-02-08 MED ORDER — CYANOCOBALAMIN (VIT B-12) 100 MCG TABLET
100 MCG | Freq: Every day | ORAL | Status: CP
Start: 2022-02-08 — End: ?
  Administered 2022-02-09 – 2022-02-13 (×5): 100 MCG via ORAL

## 2022-02-08 NOTE — Utilization Review (ED)
UM Status: Commercial - IP COVID 19+Ivanka Kirshner RNED Database administrator

## 2022-02-08 NOTE — Other
[facility]	Surgery Consult Note Patient Data:  Patient Name: Brooke Barbour Age: 81 y.o. DOB: Jun 24, 1940	 MRN: [id number]	 Consult Information: Consultation requested by: Halina Andreas, MDReason for consultation: Hiatal herniaSource of Information: Patient and EMR/Previous RecordPresentation History: 81 y.o. male with PMHx as listed below including COPD known coronary artery disease, presented to [facility] ED today for difficulty breathing that started this morning, worse with exertion. Associated cough. He denies chest pain, fevers, chills.  Denies abdominal pain or discomfort, nausea, vomiting, change in bowel habits. He is COVID positive. Surgery was consulted for [place] chest finding of large hiatal hernia.  Patient states he has known about this hernia for several years.  His only symptom is epigastric discomfort when eating large meals thus he compensates by eating small portions throughout the day.  He has never had a bowel obstruction.  This hernia has been evaluated by surgery in the past, at which time he was advised not to proceed with surgical intervention given his age and the size of the hernia.  He has not been particularly bothered by the presence of this hernia over the past several years. Imaging reviewed; [place] from today demonstrates large hiatal hernia with intrathoracic stomach and containing nonobstructed portions of the colon.Prior [place] 11/2019 reviewed via care everywhere, not able to view the image although report is available, Stable asymmetric elevation of the right hemidiaphragm and atelectatic changes along a very large paraesophageal hernia.He is afebrile and hemodynamically stable.  No electrolyte disturbances, no leukocytosis.  He is COVID positive, flu negative painNo past medical history on file.Past Surgical History: Procedure Laterality Date ? CORONARY ARTERY BYPASS GRAFT  2010 Review of Allergies/Meds/Hx: I have reviewed the patient's: allergies, current scheduled medications, current infusions, current prn medications, past medical history, past surgical history, social history and prior to admission medications. Objective Objective: Vitals:Last 24 hours: Temp:  [99.3 ?F (37.4 ?C)] 99.3 ?F (37.4 ?C)Pulse:  [81] 81Resp:  [22] 22BP: (125)/(75) 125/75SpO2:  [place] %] 89 %Physical ExamGeneral: resting comfortably in bed, normal appearance, pleasant in conversationNeuro: oriented to person, place, and situationPulmonary: normal effort, speaks in full sentences, no respiratory distress or audible wheezing. NC in place. GI: Abdomen soft flat, non distended, non tender to palpationSkin: warm, dry, <2s cap refillI/O's:No intake/output data recorded.Labs:CBC:Recent Labs Lab 12/28/231638 WBC 8.5 HGB 14.3 HCT 41.80 PLT 123* Metabolic panel:Recent Labs Lab 12/28/231812 NA 136 K 3.7 CL 101 CO2 26 BUN 18 CREATININE 1.55* CALCIUM 7.9* MG 1.5* Recent Labs Lab 12/28/231812 GLU 134* Coags:Recent Labs Lab 12/28/231638 INR 1.02 LFTs/Panc:Recent Labs Lab 12/28/231812 ALT 18 AST 20 ALKPHOS 62 BILITOT 0.6 Nutrition:Recent Labs Lab 12/28/231812 ALBUMIN 2.7* PROT 6.0 Micro:No results found for: Berdine Addison, LOWERRESPIRA	Diagnostics:CTA Chest (PE) w IV ContrastResult Date: 02/07/2022 No evidence of pulmonary embolism or acute thoracic pathology. Large hiatal hernia with intrathoracic stomach and containing nonobstructed portions of the colon. [facility] Notify System Classification: Routine. Report initiated by:  Verline Lema, MD Reported and signed by: Channing Mutters, MD  [facility] ECG/Tele Events: I have reviewed the patient's ECG as resulted in the EMR. Assessment Assessment: 81 y.o. male with PMHx as listed below including COPD known coronary artery disease, presented to [facility] ED today for difficulty breathing that started this morning, worse with exertion. Associated cough. He denies chest pain, fevers, chills.  Denies abdominal pain or discomfort, nausea, vomiting, change in bowel habits. He was diagnosed with COVID and is being admitted to medicine for further management. Surgery was consulted for [place]  chest finding of large hiatal hernia. He has known about this hernia for several years. [place] report from 2021 reviewed which comments on this large hiatal hernia. He currently has no abdominal symptoms, is not particularly bothered by the hernia at baseline, no signs of obstruction today and he has had no prior bowel obstructions. Abdominal exam is completely benign. Chronic large hiatal hernia, no obstruction, asymptomatic Plan Plan: - No indication for acute surgical intervention at this time. - Patient is not bothered by the presence of this large hiatal hernia, he has been evaluated by surgery in the past and under their advisement decided against surgical management given his age and the size of the hernia. He compensates well by adhering to small portion meals. No prior history of obstruction, no current obstruction- Being admitted to medicine for COVID management- F/u outpatient with surgery as needed. Case and plan discussed with attending physician, Dr. Maryla Morrow, PA Beeper: [phone], or mobile heartbeat 02/07/2022 6:57 PM

## 2022-02-08 NOTE — ED Notes
1630 Difficulty breathing started this AM, worse with exertion . Hx of COPD1630 med loc and labs. 1700 stacked nebs complete. 1730 pt to [facility]1737 pt is covid +1935 noted order for Mag IV PB1950 Mag initiated on dial a drip at no pumps available. Pt is given sandwich2030 noted pt was seen by hospitalist and new orders are acknowledged. Will carry out as soon as able. Confirmed with Pharm that pt can have IV dose of Decadron even having had PO Prednisone earlier today. No date per pharm on whether you can given Mag IVPB and Remdesivir same line at same time. Will continue mag until completion then start Remdesivir after.2130 rec'd antiviral and initiated. 2215 Spoke with Daughter Candise Bowens who reports pt drinks wine daily, this has been a long time activity. Pt has come to live with family recently and they have been trying to manage this alcohol consumption to prevent worsening. I told her that I would start a CIWA scale and they can follow a protocol to monitor the situation. Pt has not asked for any alcoholic beverages nor has he shown any signs of withdrawal It is unsure when he had his last drink as he was able to order and obtain as he needed despite their efforts to remove from the home. Jennifer/[name]  [phone] 2315 CIWA as noted. 2345 Report to [place] RNFloor Handoff Telemetry: 	[]  Yes		[x]  NoCode Status:   [x]  Full		[]  DNR		[]  DNI		Other (specify):Safety Precautions: [x]  Fall Risk  []  Sitter   []  Restraints	[]  Suicidal	[]  None	Other (specify):Mentation/Orientation:	 A&O (Self, person, place, time) x     4     	 Disoriented to:                    	 Special Accommodations: []  Hearing impaired   []  Blind  []  Nonverbal  []  Cognitive impairmentOxygenation Upon Admission: []  RA	[x]  NC	[]  Venti  []  Simple Mask []  Other	Baseline O2 Status? []  Yes	[x]  NoAmbulation: []  Independent	[]  Cane   []  Walker	[]  Wheelchair	[x]  Bedbound		[]  Hemiplegic	[]  Paraplegic	[]  QuadraplegicEliminiation: []  Independent	[]  Commode	[x]  Bedpan/Urinal  []  Straight Cath []  Foley cath			[]  Urostomy	[]  Colostomy	Other (specify):Diarrhea/Loose stool : []  1x within 24h  []  2x within 24h  []  3x within 24h  [x]  None 	C.Diff Order: 	[]  Ordered- needs to be collected             []  Collected-sent to lab             []  Resulted - Negative C.Diff             []  Resulted - Positive C.Diff[]  Not Ordered   []  N/ASkin Alteration: []  Pressure Injury [x]  Wound []  None []  Skin not assessedDiet: [x]  Regular/No order placed	[]  NPO		Other (specify):IV Access: [x]  PIV   []  PICC    []  Port    [] Central line    []  A-line    Other (specify)IVF/GTT Running Upon ED Departure? [x]  No	    []  Yes (specify):Outstanding Meds/Treatments/Tests:Patient Belongings:Are the belongings documented?          []  No	    [x]  YesIs someone taking belongings home?   [x]  No     []  Yes  Who? (specify)                                   Cleora Fleet, RN	 POC: Plan:  - No indication for acute surgical intervention at this time. - Patient is not bothered by the presence of this large hiatal hernia, he has  been evaluated by surgery in the past and under their advisement decided against surgical management given his age and the size of the hernia. He compensates well by adhering to small portion meals. No prior history of obstruction, no current obstruction- Being admitted to medicine for COVID management- F/u outpatient with surgery as needed.  Case and plan discussed with attending physician, Dr. Colletta [place]

## 2022-02-08 NOTE — ED Notes
12:30 AM Patient resting quietly with eyes closed. No c/o at present. IVF infusing per order, without difficulty. Incision noted to RLE, well approximated with sutures. Old bandaids hanging off, removed. Non-adherent dressing applied. Waiting for urine specimen. Urinal at bedside. Call light within reach. Care in progress.2:30 AM Patient awake and quiet. Given warm blanket. No c/o at this time. Call light within reach. Care in progress.6:35 AM Urine specimen collected and sent to lab.

## 2022-02-08 NOTE — Progress Notes
[facility]	Medicine Progress NoteExamined patient , Reviewed electronic chart for Vitals,Labs,Diagnostics,MAR.Reviewed care notes, discussed with nursing and care coordination team .Reviewed previous notes- summation of old records,reviewed pertinent radiology reports, reviewed pertinent medicine tests .High Risk ; Established problems 4+,see A&P below Feeling good. 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  He states, because of his underlying COPD, he normally wakes up congested which normally resolves after his breathing regimen/tx.?But for the past few days, his congested would not dissipate and actually got slightly worse today, so he became concerned and proceeded to [facility] ED for evaluation.?Review of Allergies/Meds/Hx/Immunization Hx : I have reviewed the patient's current medications, allergies, relevant immunization rec. Objective: Vitals:Last 24 hours: Temp:  [97.1 ?F (36.2 ?C)-99.3 ?F (37.4 ?C)] 97.8 ?F (36.6 ?C)Pulse:  [71-97] 73Resp:  [11-26] 19BP: (101-148)/(53-91) 137/73SpO2:  [89 %-98 %] 95 %Respiratory and hemodynamically stablePhysical Exam  NAD, resting ,on O2 by NC 2L ; O2 sat 97% Reviewed Labs:Recent Labs Lab 12/28/231638 12/29/230717 WBC 8.5 5.5 HGB 14.3 14.1 HCT 41.80 41.00 PLT 123* 134* Recent Labs Lab 12/28/231812 12/29/230717 NA 136 135* K 3.7 4.8 CL 101 100 CO2 26 25 BUN 18 20* CREATININE 1.55* 1.49* CALCIUM 7.9* 8.5 PROT 6.0  --  ALBUMIN 2.7*  --  BILITOT 0.6  --  ALKPHOS 62  --  ALT 18  --  AST 20  -- GLU 134* 159* Recent Labs Lab 12/28/231638 INR 1.02 No results for input(s): TROPONINI in the last 168 hours.Invalid input(s): CPKUA Recent Labs Lab 12/29/230635 SPECGRAV 1.015 PHUR 6.5 LEUKOCYTESUR Negative NITRITE Negative PROTEINUA 1+* GLUCOSEU 1+* KETONESU Trace BILIRUBINUR Negative UROBILINOGEN <2.0 No results found for: HGBA1CNo results found for: TSH, TSH3GReviewed diagnostics : CTA Chest (PE) w IV ContrastResult Date: 02/07/2022 No evidence of pulmonary embolism or acute thoracic pathology. Large hiatal hernia with intrathoracic stomach and containing nonobstructed portions of the colon. [facility] Notify System Classification: Routine. Report initiated by:  Raymond Lema, MD Reported and signed by: Raymond Mutters, MD  [facility] Radiology and Biomedical Imaging  Results for orders placed or performed during the hospital encounter of 02/07/22 EKG Result Value Ref Range  Heart Rate 75 bpm  QRS Interval 107 ms  QT Interval 375 ms  QTC Interval 419 ms  P Axis 53 deg  QRS Axis 86 deg  T Wave Axis 58 deg  P-R Interval 164 msec  SEVERITY Otherwise Normal ECG severity  CTA Chest (PE) w IV ContrastResult Date: 12/28/2023CTA CHEST (PE) W IV CONTRAST INDICATION: Pulmonary embolism (PE) suspected, high prob COMPARISON: NONE TECHNIQUE: [place] images of the chest were obtained from the lung bases through the apices after the intravenous administration of contrast. Coronal and oblique 3D/MIPS reformats are provided. IV CONTRAST: 80 mL Omnipaque 350 TECHNICAL LIMITATIONS: None. FINDINGS: PULMONARY ARTERIES: There is no evidence of filling defects in the pulmonary arteries to suspect pulmonary embolism. HEART: The heart is within normal limits for size. RV/LV RATIO: The RV/LV ratio measures less than 1.  There is no reflux of contrast into the IVC or hepatic veins. SYSTEMIC VASCULATURE: Aorta and major branches are unremarkable. LUNGS: Upper lobe predominant emphysema. Right lower lobe atelectasis secondary to large adjacent  hiatal hernia. AIRWAYS: Unremarkable. PLEURA: Unremarkable. 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 UPPER ABDOMEN: Limited evaluation of the upper abdomen is unremarkable. BONES & SOFT TISSUES: Unremarkable. These findings were corroborated on the MIP images.  No evidence of pulmonary embolism or acute thoracic pathology. Large hiatal hernia with intrathoracic stomach and containing nonobstructed portions of the colon. [facility] Notify System Classification: Routine. Report initiated by:  Raymond Lema, MD Reported and signed by: Raymond Mutters, MD  [facility] Radiology and Biomedical Imaging ED Diagnostic Ultrasound EchoResult Date: 12/28/2023Disclaimer:Please look in ED Provider Note for the Impression.Consultants:None Assessment and Plan: 81 y.o.male has no past medical history on file. ; Hx of COPD , NOT on O2 at home; Hx of CAD/s/p CABG , HLD # COVID infection - still on O2 by NC - weaning off - continues remdesivir #SIRS/sepsis? Lactic acidosis - IV NS , re-check Lactic acid - continue hydration ; repeat labs in AM . # AKI - improving ISO IV hydration . # Hypomagnesemia- PO repletion #macrocytosis - add vitamin B12 , folate Chronic issues - continue home meds ( CAD,HTN,HLD, Depression) # cluster care / avoid disruptions of sleep DVT PPXDischarge planning:TBDFuture Appointments     Provider Department Center  02/18/2022 2:00 PM Raymond Etienne, MD [facility] [place]  Family communication :I called and gave update to the son over the phone Signed:Shital Crayton Maximino Gardner, [facility] page with any questions - [facility] Portions of this document were transcribed using speech recognition software. Despite attempts at proofreading, this document may contain transcription errors. Please do not hesitate to request clarification ; contact this Clinical research associate via [facility], smart web.

## 2022-02-08 NOTE — ED Notes
9:06 AM Assumed care, pt awake, ate breakfast, VSS, O2 lowered to 2L vis NC, O2 sat remains at 96%, took pills whole with water.

## 2022-02-08 NOTE — H&P
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  Raymond Gardner states, because of his underlying COPD, Raymond Gardner normally wakes up congested which normally resolves after his breathing regimen/tx.But for the past few days, his congested would not dissipate and actually got slightly worse today, so Raymond Gardner became concerned and proceeded to [facility] ED for evaluation.Medical History: PMH PSH No past medical history on file. Past Surgical History: Procedure Laterality Date ? CORONARY ARTERY BYPASS GRAFT  2010  Social History Family History Social History Tobacco Use ? Smoking status: Former   Current packs/day: 0.00   Types: Cigarettes   Quit date: 1987   Years since quitting: 37.0 ? Smokeless tobacco: Never Substance Use Topics ? Alcohol use: Yes   Comment: 7 glasses of wine per week, 1 per day  Family History Problem Relation Age of Onset ? COPD Brother  ? Coronary Artery Disease Brother   Prior to Admission Medications (Not in a hospital admission) Allergies No Known Allergies Review of Systems: Review of Systems Constitutional: Positive for activity change and fever. Negative for chills. HENT: Negative for congestion, dental problem and drooling.  Eyes: Negative for pain, discharge and itching. Respiratory: Positive for cough. Negative for apnea and chest tightness.  Cardiovascular: Negative for chest pain, palpitations and leg swelling. Gastrointestinal: Negative for abdominal distention, abdominal pain and anal bleeding. Endocrine: Negative for cold intolerance, heat intolerance and polydipsia. Genitourinary: Negative for difficulty urinating, dysuria and enuresis. Musculoskeletal: Negative for arthralgias, back pain and gait problem. Skin: Negative for color change and pallor. Allergic/Immunologic: Negative for environmental allergies, food allergies and immunocompromised state. Neurological: Negative for dizziness, facial asymmetry and headaches. Hematological: Negative for adenopathy. Does not bruise/bleed easily. Psychiatric/Behavioral: Negative for agitation, behavioral problems and confusion.  Objective: Vitals:Last 24 hours: Temp:  [98.2 ?F (36.8 ?C)-99.3 ?F (37.4 ?C)] 98.2 ?F (36.8 ?C)Pulse:  [73-97] 96Resp:  [11-26] 23BP: (108-148)/(53-91) 125/79SpO2:  [89 %-98 %] 90 %Physical Exam: Physical ExamVitals reviewed. Constitutional:     Appearance: Normal appearance. HENT:    Head: Normocephalic and atraumatic.    Right Ear: Tympanic membrane normal.    Left Ear: Tympanic membrane normal.    Nose: Nose normal.    Mouth/Throat:    Mouth: Mucous membranes are moist.    Pharynx: Oropharynx is clear. Eyes:    Conjunctiva/sclera: Conjunctivae normal.    Pupils: Pupils are equal, round, and reactive to light. Cardiovascular:    Rate and Rhythm: Normal rate and regular rhythm.    Pulses: Normal pulses.    Heart sounds: Normal heart sounds. Pulmonary:    Effort: Pulmonary effort is normal.    Breath sounds: Normal breath sounds. Abdominal:    General: Abdomen is flat. Bowel sounds are normal.    Palpations: Abdomen is soft. Musculoskeletal:       General: Normal range of motion.    Cervical back: Normal range of motion and neck supple. Skin:   General: Skin is warm and dry. Neurological:    General: No focal deficit present.    Mental Status: Raymond Gardner is alert. Mental status is at baseline. Psychiatric:       Mood and Affect: Mood normal. Thought Content: Thought content normal.  Labs: Last 24 hours: Recent  Results (from the past 24 hour(s)) EKG  Collection Time: 02/07/22  4:17 PM Result Value Ref Range  Heart Rate 75 bpm  QRS Interval 107 ms  QT Interval 375 ms  QTC Interval 419 ms  P Axis 53 deg  QRS Axis 86 deg  T Wave Axis 58 deg  P-R Interval 164 msec  SEVERITY Otherwise Normal ECG severity Lactic Acid, Whole Blood, Venous (Reflex 2H Repeat)  (MC)  Collection Time: 02/07/22  4:38 PM Result Value Ref Range  Lactic Acid, Blood Gas 1.4 0.6 - 1.4 mmol/L Blood gas, venous (BH YH)  Collection Time: 02/07/22  4:38 PM Result Value Ref Range  pH, Venous 7.38 7.32 - 7.43 units  pCO2, Venous 47 38 - 54 mmHg  PO2, Venous 31 (L) 40 - 50 mmHg  O2 Sat, Venous 56 Not Established %  Calculated HCO3, Venous 27.5 Not Established mmol/L  Base Excess, Venous 2 Not Established mmol/L  Temperature, Venous 99.3 Fahrenheit PT/INR  Collection Time: 02/07/22  4:38 PM Result Value Ref Range  Prothrombin Time 10.3 9.5 - 12.1 seconds  INR 1.02 0.93 - 1.07 CBC auto differential  Collection Time: 02/07/22  4:38 PM Result Value Ref Range  WBC 8.5 4.0 - 11.0 x1000/?L  RBC 3.99 (L) 4.00 - 6.00 M/?L  Hemoglobin 14.3 13.2 - 17.1 g/dL  Hematocrit 16.10 96.04 - 50.00 %  MCV 104.8 (H) 80.0 - 100.0 fL  MCH 35.8 (H) 27.0 - 33.0 pg  MCHC 34.2 31.0 - 36.0 g/dL  RDW-CV 54.0 98.1 - 19.1 %  Platelets 123 (L) 150 - 420 x1000/?L  MPV 11.8 8.0 - 12.0 fL  Neutrophils 59.5 39.0 - 72.0 %  Lymphocytes 22.0 17.0 - 50.0 %  Monocytes 17.1 (H) 4.0 - 12.0 %  Eosinophils 0.6 0.0 - 5.0 %  Basophil 0.6 0.0 - 1.4 %  Immature Granulocytes 0.2 0.0 - 1.0 %  nRBC 0.0 0.0 - 1.0 %  ANC(Abs Neutrophil Count) 5.06 2.00 - 7.60 x 1000/?L  Absolute Lymphocyte Count 1.87 0.60 - 3.70 x 1000/?L  Monocyte Absolute Count 1.45 (H) 0.00 - 1.00 x 1000/?L  Eosinophil Absolute Count 0.05 0.00 - 1.00 x 1000/?L  Basophil Absolute Count 0.05 0.00 - 1.00 x 1000/?L  Absolute Immature Granulocyte Count 0.02 0.00 - 0.30 x 1000/?L  Absolute nRBC 0.00 0.00 - 1.00 x 1000/?L SARS CoV-2 (COVID-19) RNA-[facility] LMW YH)  Collection Time: 02/07/22  4:39 PM  Specimen: Nasopharynx; Viral Result Value Ref Range  SARS-CoV-2 RNA (COVID-19)  Positive (A) Negative Influenza A+B/RSV by RT-PCR (BH GH LMW YH)  Collection Time: 02/07/22  4:39 PM  Specimen: Nasopharynx; Viral Result Value Ref Range  Influenza A Negative Negative  Influenza B Negative Negative  Respiratory Syncytial Virus Negative Negative Blood culture  Collection Time: 02/07/22  4:39 PM  Specimen: Vein, Peripheral; Blood Result Value Ref Range  Blood Culture No Growth to Date  Blood culture  Collection Time: 02/07/22  4:39 PM  Specimen: Vein, Peripheral; Blood Result Value Ref Range  Blood Culture No Growth to Date  Lactic Acid, Whole Blood, Venous (Reflex 2H Repeat)  (MC)  Collection Time: 02/07/22  6:12 PM Result Value Ref Range  Lactic Acid, Blood Gas 2.6 (H) 0.6 - 1.4 mmol/L Mag  Collection Time: 02/07/22  6:12 PM Result Value Ref Range  Magnesium 1.5 (L) 1.8 - 2.5 mg/dL Troponin T High Sensitivity, Emergency; 0 hour baseline AND 1 hour with reflex (3 hour)  Collection Time: 02/07/22  6:12 PM Result  Value Ref Range  High Sensitivity Troponin T 19 (H) See Comment ng/L Comprehensive metabolic panel  Collection Time: 02/07/22  6:12 PM Result Value Ref Range  Sodium 136 136 - 145 mmol/L  Potassium 3.7 3.5 - 5.1 mmol/L  Chloride 101 98 - 107 mmol/L  CO2 26 21 - 32 mmol/L  Anion Gap 9 5 - 16  Glucose 134 (H) 70 - 100 mg/dL  BUN 18 7 - 18 mg/dL  Creatinine 1.30 (H) 8.65 - 1.30 mg/dL  Calcium 7.9 (L) 8.5 - 10.5 mg/dL  BUN/Creatinine Ratio 78.4 8.0 - 23.0  Total Protein 6.0 6.0 - 8.0 g/dL  Albumin 2.7 (L) 3.4 - 5.0 g/dL  Total Bilirubin 0.6 0.2 - 1.2 mg/dL Alkaline Phosphatase 62 46 - 116 U/L  Alanine Aminotransferase (ALT) 18 14 - 63 U/L  Aspartate Aminotransferase (AST) 20 10 - 42 U/L  Globulin 3.3 2.3 - 3.5 g/dL  A/G Ratio 0.8 (L) 1.0 - 2.2  AST/ALT Ratio 1.1 Reference Range Not Established  eGFR (Creatinine) 45 (L) >=60 mL/min/1.68m2 Troponin T High Sensitivity, 1 Hour With Reflex (BH GH LMW YH)  Collection Time: 02/07/22  7:24 PM Result Value Ref Range  High Sensitivity Troponin T 20 (H) See Comment ng/L  1 hour Delta from 0 Hour, HS-Troponin T 1 ng/L Diagnostics:CTA PE?IMPRESSION: No evidence of pulmonary embolism or acute thoracic pathology.?Large hiatal hernia with intrathoracic stomach and containing nonobstructed portions of the colon.?ECG/Tele Events:    SinusBorderline RADAssessment: 81 yo M h/o COPD (not on home O2) presents with worsening SOB/chest congestion in the setting of Covid 19 infectionI have reviewed the patient's problem list and updated it as needed.Plan: ID:  Worsening SOB/congestion 2/2 to Covid infection-Special Isolation precautions-Pt's O2 noted to dip into the high 80s on exam, will Start Remdesivir and Decadron-Will order supplemental O2 (wean as tolerated)-OOB to chair-Anti pyretics and Anti tussives ordered-Monitor for clinical improvementPULM:  COPD-pt not in acute exacerbation-cont home pulm regimenRENAL:  Lactic acidosis in the setting of Covid -Will boluls 250cc and start NS@100cchr -cont to treat infection-Trend LAHypomagnesemia-Repleted by the ED-Repeat Mg level in the a.m.GI:  Large Hiatal Hernia-Seen by surgery, no acute interventionCAD s/p CABG/HLD-cont ASA and SatinHTN-cont ARB-Monitor BP and adjust regimen as indicatedPSYCH:  Depression-cont ZoloftI have discussed the patients code status:Yeswith:patientNotifications: PCP: Elease Etienne [phone] Care Provider was notified of this admission. NoFamily was notified of this admission. NoPlan discussed with patient and/or family. YesI have reviewed the plan of care with the bedside nurse, including an emphasis on the most important aspects of care for the next 12 hours: noSigned:Iolanda Folson Sharlet Salina, DO Beeper: mhb12/28/20238:14 PM

## 2022-02-09 LAB — LACTIC ACID, WHOLE BLOOD (VENOUS)  (MC): BKR LACTIC ACID, BLOOD GAS VENOUS (MC): 1.1 mmol/L (ref 0.6–1.4)

## 2022-02-09 LAB — COMPREHENSIVE METABOLIC PANEL
BKR A/G RATIO: 0.8 — ABNORMAL LOW (ref 1.0–2.2)
BKR ALANINE AMINOTRANSFERASE (ALT): 17 U/L (ref 14–63)
BKR ALBUMIN: 2.7 g/dL — ABNORMAL LOW (ref 3.4–5.0)
BKR ALKALINE PHOSPHATASE: 64 U/L (ref 46–116)
BKR ANION GAP: 9 (ref 5–16)
BKR ASPARTATE AMINOTRANSFERASE (AST): 24 U/L (ref 10–42)
BKR AST/ALT RATIO: 1.4
BKR BILIRUBIN TOTAL: 0.3 mg/dL (ref 0.2–1.2)
BKR BLOOD UREA NITROGEN: 25 mg/dL — ABNORMAL HIGH (ref 7–18)
BKR BUN / CREAT RATIO: 18.5 (ref 8.0–23.0)
BKR CALCIUM: 8.1 mg/dL — ABNORMAL LOW (ref 8.5–10.5)
BKR CHLORIDE: 104 mmol/L (ref 98–107)
BKR CO2: 24 mmol/L (ref 21–32)
BKR CREATININE: 1.35 mg/dL — ABNORMAL HIGH (ref 0.80–1.30)
BKR EGFR, CREATININE (CKD-EPI 2021): 53 mL/min/{1.73_m2} — ABNORMAL LOW (ref >=60)
BKR GLOBULIN: 3.6 g/dL — ABNORMAL HIGH (ref 2.3–3.5)
BKR GLUCOSE: 134 mg/dL — ABNORMAL HIGH (ref 70–100)
BKR POTASSIUM: 4.6 mmol/L (ref 3.5–5.1)
BKR PROTEIN TOTAL: 6.3 g/dL (ref 6.0–8.0)
BKR SODIUM: 137 mmol/L (ref 136–145)

## 2022-02-09 LAB — CBC WITH AUTO DIFFERENTIAL
BKR WAM ABSOLUTE IMMATURE GRANULOCYTES.: 0.07 x 1000/ÂµL (ref 0.00–0.30)
BKR WAM ABSOLUTE LYMPHOCYTE COUNT.: 0.73 x 1000/ÂµL (ref 0.60–3.70)
BKR WAM ABSOLUTE NRBC (2 DEC): 0 x 1000/ÂµL (ref 0.00–1.00)
BKR WAM ANALYZER ANC: 10.75 x 1000/ÂµL — ABNORMAL HIGH (ref 2.00–7.60)
BKR WAM BASOPHIL ABSOLUTE COUNT.: 0.01 x 1000/ÂµL (ref 0.00–1.00)
BKR WAM BASOPHILS: 0.1 % (ref 0.0–1.4)
BKR WAM EOSINOPHIL ABSOLUTE COUNT.: 0 x 1000/ÂµL (ref 0.00–1.00)
BKR WAM EOSINOPHILS: 0 % (ref 0.0–5.0)
BKR WAM HEMATOCRIT (2 DEC): 42.1 % (ref 38.50–50.00)
BKR WAM HEMOGLOBIN: 14.5 g/dL (ref 13.2–17.1)
BKR WAM IMMATURE GRANULOCYTES: 0.6 % (ref 0.0–1.0)
BKR WAM LYMPHOCYTES: 6.1 % — ABNORMAL LOW (ref 17.0–50.0)
BKR WAM MCH (PG): 35.5 pg — ABNORMAL HIGH (ref 27.0–33.0)
BKR WAM MCHC: 34.4 g/dL (ref 31.0–36.0)
BKR WAM MCV: 102.9 fL — ABNORMAL HIGH (ref 80.0–100.0)
BKR WAM MONOCYTE ABSOLUTE COUNT.: 0.5 x 1000/ÂµL (ref 0.00–1.00)
BKR WAM MONOCYTES: 4.1 % (ref 4.0–12.0)
BKR WAM MPV: 11.6 fL (ref 8.0–12.0)
BKR WAM NEUTROPHILS: 89.1 % — ABNORMAL HIGH (ref 39.0–72.0)
BKR WAM NUCLEATED RED BLOOD CELLS: 0 % (ref 0.0–1.0)
BKR WAM PLATELETS: 143 x1000/ÂµL — ABNORMAL LOW (ref 150–420)
BKR WAM RDW-CV: 13 % (ref 11.0–15.0)
BKR WAM RED BLOOD CELL COUNT.: 4.09 M/ÂµL (ref 4.00–6.00)
BKR WAM WHITE BLOOD CELL COUNT: 12.1 x1000/ÂµL — ABNORMAL HIGH (ref 4.0–11.0)

## 2022-02-09 LAB — MAGNESIUM: BKR MAGNESIUM: 1.7 mg/dL — ABNORMAL LOW (ref 1.8–2.5)

## 2022-02-09 LAB — VITAMIN B12: BKR VITAMIN B12: 303 pg/mL (ref 232–1245)

## 2022-02-09 NOTE — Other
Admission Note Nursing Raymond Gardner is a 81 y.o. male admitted with a chief complaint of shortness of breath. Patient arrived from  home.Patient is   A&Ox4, forgetful at times.Placed on isolation precautions.On 3L O2 nasal cannula, shortness of breath noted.No reports of pain.Tolerated all medications well.2 RN skin check completed, incision noted to R shin area.Vitals collected and stable.Tele monitor in place brady sinus rhythm noted.Ambulating in room independently.Performs adl care independently.Call bell within reach. Vitals:  02/09/22 1000 02/09/22 1409 02/09/22 1456 02/09/22 1500 BP: 125/84 137/88 (!) 151/90  Pulse: (!) 55 68 62  Resp: 20 20 20   Temp:  97.9 ?F (36.6 ?C) 97.5 ?F (36.4 ?C)  TempSrc:  Oral Oral  SpO2: 96% 96% 97%  Weight:    74.4 kg Height:    5' 11 (1.803 m) Oxygen therapy Oxygen TherapySpO2: 97 %Device (Oxygen Therapy): nasal cannulaO2 Flow (L/min): 3Check SpO2 on Rest and Exertion: noI have reviewed the patient's current medication orders..I have reviewed patient valuables Belongings charted in last 7 days: Valuable(s) : Clothing; Cell Phone (02/09/2022  3:00 PM) See flowsheets, patient education and plan of care for additional information.

## 2022-02-09 NOTE — Other
Home Hospital Screening ToolPatient Data:  Patient Name: Raymond Gardner Age: 81 y.o. DOB: 10-19-1940	 MRN: [id number]	 Brief History COVID-19 with new O2 requirement 4L being weaned down and increased WOB.Thank you for this consult.Next steps: Patient clinically appropriate for [facility] [place] Control nurse will perform social criteria evaluationPlease call [facility] [phone] with any questionsPatient's calculated screening result: Clinical Screening Tool Result: MEETS CRITERIA for [facility] as of today but may meet clinical criteria on subsequent days.Home Hospital provides acute care to eligible patients who meet inpatient level of care in their own home. Eligible patients are non-pregnant adults with traditional Medicare, eligible Managed Medicare or [facility] employee health plan who meet specific clinical and social stability criteria.Patients will receive multiple in-person nursing visits per day, 24/7 virtual nursing, scheduled and PRN in-person PA or APRN visits and daily video MD visits.   Diagnostics and interventions include IV infusion/antibiotics, rehab therapy, mobile imaging and video specialty consult follow up.  For more information: [email or web address] Clinical Screening Tool - 02/09/22 0938    Clinical Screening Tool  Are step-down/ICU level interventions currently required or anticipated to be required? No   Is the patient likely to need any of the below within the next 12-24hrs? No   Clinical Screening Tool Result MEETS CRITERIA      Electronically Signed:Lyonel Morejon T. Freddie Apley MD12/30/2023, 9:39 AMIf needed the [facility] Control MD can be reached at [phone] OR to text, search for the [facility] Dynamic role: [facility] Control All.

## 2022-02-09 NOTE — Other
-  CONSULT  REQUEST  DOCUMENTATION-[facility] NOTE-Type of consult: [facility] -New [phone] HillDonaMeredeth Ideleming /Location: Rm15/15 / COVID / Please confirm receipt of this message by texting back ?OK?-1 - Mobile Heartbeat message sent to Pola Corn at 9:05 AM. Received response at 0905.-Lilienne Weins Dick12/30/20239:04 [facility] [phone]

## 2022-02-09 NOTE — ED Notes
8:04 PM Patient is resting comfortably in stretcher no signs of acute distress noted. Bed locked and in lowest position. Call light within reach, side rails up for safety.

## 2022-02-09 NOTE — ED Notes
7:12 AM Report received. 02 sat on 4L/min over night satting 95%.

## 2022-02-09 NOTE — Other
Home Hospital Screening ToolPatient Data:  Patient Name: Raymond Gardner Age: 81 y.o. DOB: 1940/10/22	 MRN: [id number]	 Brief History COVID-19, increased lactic acidNext steps: Pending studies and evaluationsPlease call [facility] [phone] with any questionsPatient's calculated screening result: Clinical Screening Tool Result: DOES NOT MEET CRITERIA for [facility] as of today but may meet clinical criteria on subsequent days.Home Hospital provides acute care to eligible patients who meet inpatient level of care in their own home. Eligible patients are non-pregnant adults with traditional Medicare, eligible Managed Medicare or [facility] employee health plan who meet specific clinical and social stability criteria.Patients will receive multiple in-person nursing visits per day, 24/7 virtual nursing, scheduled and PRN in-person PA or APRN visits and daily video MD visits.   Diagnostics and interventions include IV infusion/antibiotics, rehab therapy, mobile imaging and video specialty consult follow up.  For more information: [email or web address] Clinical Screening Tool - 02/09/22 0757    Clinical Screening Tool  Are step-down/ICU level interventions currently required or anticipated to be required? No   Is the patient likely to need any of the below within the next 12-24hrs? Yes   Clinical Screening Tool Result DOES NOT MEET CRITERIA    Review of Eligibility  Review of eligibility Follow up for next day      Electronically Signed:Darwin Rothlisberger, PA12/30/2023, 7:59 AMIf needed the [facility] Control MD can be reached at [phone] OR to text, search for the [facility] Dynamic role: [facility] Control All.

## 2022-02-09 NOTE — ED Notes
8:17 AM Assumed care of patient at 7:50 AM.  Pt is alert and oriented x3.  Denies pain.  Diet provided.  Able to take scheduled AM meds whole w water.  Call bell within reach.  COVID isolation maintained.10:30 AM pt requesting lights to be turned off, would like to go to sleep.  Call bell within reach.

## 2022-02-10 LAB — COMPREHENSIVE METABOLIC PANEL
BKR A/G RATIO: 0.8 — ABNORMAL LOW (ref 1.0–2.2)
BKR ALANINE AMINOTRANSFERASE (ALT): 18 U/L (ref 14–63)
BKR ALBUMIN: 2.4 g/dL — ABNORMAL LOW (ref 3.4–5.0)
BKR ALKALINE PHOSPHATASE: 52 U/L (ref 46–116)
BKR ANION GAP: 8 (ref 5–16)
BKR ASPARTATE AMINOTRANSFERASE (AST): 20 U/L (ref 10–42)
BKR AST/ALT RATIO: 1.1
BKR BILIRUBIN TOTAL: 0.3 mg/dL (ref 0.2–1.2)
BKR BLOOD UREA NITROGEN: 25 mg/dL — ABNORMAL HIGH (ref 7–18)
BKR BUN / CREAT RATIO: 19.1 (ref 8.0–23.0)
BKR CALCIUM: 7.7 mg/dL — ABNORMAL LOW (ref 8.5–10.5)
BKR CHLORIDE: 104 mmol/L (ref 98–107)
BKR CO2: 25 mmol/L (ref 21–32)
BKR CREATININE: 1.31 mg/dL — ABNORMAL HIGH (ref 0.80–1.30)
BKR EGFR, CREATININE (CKD-EPI 2021): 55 mL/min/{1.73_m2} — ABNORMAL LOW (ref >=60)
BKR GLOBULIN: 3 g/dL (ref 2.3–3.5)
BKR GLUCOSE: 130 mg/dL — ABNORMAL HIGH (ref 70–100)
BKR POTASSIUM: 4.2 mmol/L (ref 3.5–5.1)
BKR PROTEIN TOTAL: 5.4 g/dL — ABNORMAL LOW (ref 6.0–8.0)
BKR SODIUM: 137 mmol/L (ref 136–145)

## 2022-02-10 LAB — CBC WITH AUTO DIFFERENTIAL
BKR WAM ABSOLUTE IMMATURE GRANULOCYTES.: 0.06 x 1000/ÂµL (ref 0.00–0.30)
BKR WAM ABSOLUTE LYMPHOCYTE COUNT.: 0.78 x 1000/ÂµL (ref 0.60–3.70)
BKR WAM ABSOLUTE NRBC (2 DEC): 0 x 1000/ÂµL (ref 0.00–1.00)
BKR WAM ANALYZER ANC: 8.25 x 1000/ÂµL — ABNORMAL HIGH (ref 2.00–7.60)
BKR WAM BASOPHIL ABSOLUTE COUNT.: 0.01 x 1000/ÂµL (ref 0.00–1.00)
BKR WAM BASOPHILS: 0.1 % (ref 0.0–1.4)
BKR WAM EOSINOPHIL ABSOLUTE COUNT.: 0 x 1000/ÂµL (ref 0.00–1.00)
BKR WAM EOSINOPHILS: 0 % (ref 0.0–5.0)
BKR WAM HEMATOCRIT (2 DEC): 39.7 % (ref 38.50–50.00)
BKR WAM HEMOGLOBIN: 13.3 g/dL (ref 13.2–17.1)
BKR WAM IMMATURE GRANULOCYTES: 0.6 % (ref 0.0–1.0)
BKR WAM LYMPHOCYTES: 8.3 % — ABNORMAL LOW (ref 17.0–50.0)
BKR WAM MCH (PG): 35.1 pg — ABNORMAL HIGH (ref 27.0–33.0)
BKR WAM MCHC: 33.5 g/dL (ref 31.0–36.0)
BKR WAM MCV: 104.7 fL — ABNORMAL HIGH (ref 80.0–100.0)
BKR WAM MONOCYTE ABSOLUTE COUNT.: 0.31 x 1000/ÂµL (ref 0.00–1.00)
BKR WAM MONOCYTES: 3.3 % — ABNORMAL LOW (ref 4.0–12.0)
BKR WAM MPV: 12.1 fL — ABNORMAL HIGH (ref 8.0–12.0)
BKR WAM NEUTROPHILS: 87.7 % — ABNORMAL HIGH (ref 39.0–72.0)
BKR WAM NUCLEATED RED BLOOD CELLS: 0 % (ref 0.0–1.0)
BKR WAM PLATELETS: 136 x1000/ÂµL — ABNORMAL LOW (ref 150–420)
BKR WAM RDW-CV: 12.8 % (ref 11.0–15.0)
BKR WAM RED BLOOD CELL COUNT.: 3.79 M/ÂµL — ABNORMAL LOW (ref 4.00–6.00)
BKR WAM WHITE BLOOD CELL COUNT: 9.4 x1000/ÂµL (ref 4.0–11.0)

## 2022-02-10 LAB — MAGNESIUM: BKR MAGNESIUM: 1.7 mg/dL — ABNORMAL LOW (ref 1.8–2.5)

## 2022-02-10 NOTE — Plan of Care
Problem: Adult Inpatient Plan of CareGoal: Plan of Care ReviewOutcome: Interventions implemented as appropriateGoal: Patient-Specific Goal (Individualized)Outcome: Interventions implemented as appropriateGoal: Absence of Hospital-Acquired Illness or InjuryOutcome: Interventions implemented as appropriateGoal: Optimal Comfort and WellbeingOutcome: Interventions implemented as appropriateGoal: Readiness for Transition of CareOutcome: Interventions implemented as appropriate Problem: InfectionGoal: Absence of Infection Signs and SymptomsOutcome: Interventions implemented as appropriate Problem: Wound Healing ProgressionGoal: Optimal Wound HealingOutcome: Interventions implemented as appropriate Problem: Fall Injury RiskGoal: Absence of Fall and Fall-Related InjuryOutcome: Interventions implemented as appropriate Plan of Care Overview/ Patient Status    Pt asleep on and off during the night. Vs stable. Pt alert and oriented, forgetful at times. Up to the bathroom with assistance. Iv fluids infusing as ordered. Remains in sinus rhythm on telemetry monitor. Oxygen on at 3 liters. Covid precautions maintained. No acute distress noted.

## 2022-02-10 NOTE — Plan of Care
Plan of Care Overview/ Patient Status    Assumed care at 0700, patient is alert and oriented x4, able to make needs knownPt denies pain/SOB on this shiftIVF continued, tolerating wellTele monitoring continued, NSR with periods of bradycardia on this shiftCovid precautions maintainedBed locked in lowest position, call bell within reach, will continue POC

## 2022-02-10 NOTE — Progress Notes
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  He states, because of his underlying COPD, he normally wakes up congested which normally resolves after his breathing regimen/tx.?But for the past few days, his congested would not dissipate and actually got slightly worse today, so he became concerned and proceeded to [facility] ED for evaluation.?Review of Allergies/Meds/Hx/Immunization Hx : I have reviewed the patient's current medications, allergies, relevant immunization rec. Objective: Vitals:Last 24 hours: Temp:  [97.4 ?F (36.3 ?C)-97.9 ?F (36.6 ?C)] 97.4 ?F (36.3 ?C)Pulse:  [59-68] 60Resp:  [16-22] 16BP: (137-151)/(67-90) 145/75SpO2:  [94 %-97 %] 97 %Respiratory and hemodynamically stablePhysical Exam  NAD, resting ,on O2 by NC 2L ; O2 sat 97% Reviewed Labs:Recent Labs Lab 12/29/230717 12/30/230713 12/31/230734 WBC 5.5 12.1* 9.4 HGB 14.1 14.5 13.3 HCT 41.00 42.10 39.70 PLT 134* 143* 136* Recent Labs Lab 12/28/231812 12/29/230717 12/30/230713 12/31/230735 NA 136 135* 137 137 K 3.7 4.8 4.6 4.2 CL 101 100 104 104 CO2 26 25 24 25  BUN 18 20* 25* 25* CREATININE 1.55* 1.49* 1.35* 1.31* CALCIUM 7.9* 8.5 8.1* 7.7* PROT 6.0  --  6.3 5.4* ALBUMIN 2.7*  --  2.7* 2.4* BILITOT 0.6  --  0.3 0.3 ALKPHOS 62  --  64 52 ALT 18  --  17 18 AST 20  --  24 20 GLU 134* 159* 134* 130* Recent Labs Lab 12/28/231638 INR 1.02 No results for input(s): TROPONINI in the last 168 hours.Invalid input(s): CPKUA Recent Labs Lab 12/29/230635 SPECGRAV 1.015 PHUR 6.5 LEUKOCYTESUR Negative NITRITE Negative PROTEINUA 1+* GLUCOSEU 1+* KETONESU Trace BILIRUBINUR Negative UROBILINOGEN <2.0 No results found for: HGBA1CNo results found for: TSH, TSH3GReviewed diagnostics : CTA Chest (PE) w IV ContrastResult Date: 02/07/2022 No evidence of pulmonary embolism or acute thoracic pathology. Large hiatal hernia with intrathoracic stomach and containing nonobstructed portions of the colon. [place] [facility] Notify System Classification: Routine. Report initiated by:  Verline Lema, MD Reported and signed by: Channing Mutters, MD  [facility] Radiology and Biomedical Imaging  Results for orders placed or performed during the hospital encounter of 02/07/22 EKG Result Value Ref Range  Heart Rate 75 bpm  QRS Interval 107 ms  QT Interval 375 ms  QTC Interval 419 ms  P Axis 53 deg  QRS Axis 86 deg  T Wave Axis 58 deg  P-R Interval 164 msec  SEVERITY Otherwise Normal ECG severity  CTA Chest (PE) w IV ContrastResult Date: 12/28/2023CTA CHEST (PE) W IV CONTRAST INDICATION: Pulmonary embolism (PE) suspected, high prob COMPARISON: NONE TECHNIQUE: Enigma images of the chest were obtained from the lung bases through the apices after the intravenous administration of contrast. Coronal and oblique 3D/MIPS reformats are provided. IV CONTRAST: 80 mL Omnipaque 350 TECHNICAL LIMITATIONS: None. FINDINGS: PULMONARY ARTERIES: There is no evidence of filling defects in the pulmonary arteries to suspect pulmonary embolism. HEART: The heart is within normal limits for size. RV/LV RATIO: The RV/LV ratio measures less than 1.  There is  no reflux of contrast into the IVC or hepatic veins. SYSTEMIC VASCULATURE: Aorta and major branches are unremarkable. LUNGS: Upper lobe predominant emphysema. Right lower lobe atelectasis secondary to large adjacent hiatal hernia. AIRWAYS: Unremarkable. PLEURA: Unremarkable. 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 UPPER ABDOMEN: Limited evaluation of the upper abdomen is unremarkable. BONES & SOFT TISSUES: Unremarkable. These findings were corroborated on the MIP images.  No evidence of pulmonary embolism or acute thoracic pathology. Large hiatal hernia with intrathoracic stomach and containing nonobstructed portions of the colon. [place] [facility] Notify System Classification: Routine. Report initiated by:  Verline Lema, MD Reported and signed by: Channing Mutters, MD  [facility] Radiology and Biomedical Imaging ED Diagnostic Ultrasound EchoResult Date: 12/28/2023Disclaimer:Please look in ED Provider Note for the Impression.Consultants:INPATIENT CONSULT TO HOME HOSPITAL EVALUATION Assessment and Plan: 81 y.o.male has no past medical history on file. ; Hx of COPD , NOT on O2 at home; Hx of CAD/s/p CABG , HLD # COVID infection - still on O2 by NC - weaning off - continues remdesivir #SIRS/sepsis? Lactic acidosis - IV NS , re-check Lactic acid - continue hydration ; repeat labs in AM . # AKI - improving ISO IV hydration . # Hypomagnesemia- PO repletion #macrocytosis - add vitamin B12 , folate Chronic issues - continue home meds ( CAD,HTN,HLD, Depression) # cluster care / avoid disruptions of sleep DVT PPXDischarge planning:TBDFuture Appointments     Provider Department Center  02/18/2022 2:00 PM Elease Etienne, MD [facility]. [place]   Family communication :I called and gave update to the son over the phone Signed:Rosela Supak Giuran Benetato [phone] mobile heartbeat up to 6:00 p.m.Please page with any questions - MHB Portions of this document were transcribed using speech recognition software. Despite attempts at proofreading, this document may contain transcription errors. Please do not hesitate to request clarification ; contact this Clinical research associate via M[facility], smart web.

## 2022-02-10 NOTE — ED Provider Notes
Chief Complaint Patient presents with ? Shortness of Breath   Difficulty breathing started this AM, worse with exertion . Hx of COPD --------------------------------------------------------------------------------------Emergency Medicine Attending MDM: Presentation:The patient is a 81 y.o. 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 Denies any chest pain. Does have increased cough throughout the day today. Has some R leg edema, had a skin cancer removed a few days ago, leg appears tightly wrapped. Denies AP, n/v/d. Denies hx VTE. Not on A/C. Pertinent physical exam findings include: Gen:  alert and oriented x3, answering questions appropriatelyNeuro:  no sensory deficits or focal weakness, moving all extremities and following commandsHEENT:  MM moist, no oropharyngeal lesions, trachea midlinePulm:  Tachypneic with clear lungs. CTABL, no wheezing or cracklesCard:  normal rate, no obvious murmurs (in loud ED setting)Abd:  SNTND, no palpable masses, no rebound or guardingExtr/Skin:  warm and dry, 1+ R LE pitting edema. Skin incision appears healthy without erythema, warmth, swelling or discharge. Differential Diagnosis: COPD exacerbationCOVID-19 infectionInfluenzaRSV infectionPneumoniaPulmonary embolismACSPulmonary edema/HFAdditional diagnoses that were considered but felt less likely based on history and exam include: PTX Plan: Testing: labs, ECG, trops, pro-BNP, covid/flu/rsv swabs, VBGImaging: CTA chest, pocus echoManagement: duoneb x 3, tylenol, prednisone 60 mgConsults: noneED Course:Work up notable for (+) COVID-19 test.CTA chest obtained, no evidence of PE. However notable for: Large hiatal hernia with intrathoracic stomach and containing nonobstructed portions of the colon.Discussed with surgery, appears similar to prior imaging, no acute intervention.Admitted to medicine given needing his oxygen. Improved resp distress with nebs.  Derrill Center, MDPhysician, Department of Emergency MedicineBridgeport [facility]ellville [facility] on Mobile Heartbeat-----------------------------------------------------------------------------------------Please note that this chart may have been partially/fully written using dictation software and therefore may be subject to typos, which were corrected to the best of my ability. Please excuse any typos that remain present. An acute or life threatening problem was considered during this evaluation  A decision regarding hospitalization was made during this visit  External data reviewed: Notes (OSH or non-ED)Directly spoke with:  Hospitalist  Physical ExamED Triage Vitals [02/07/22 1608]BP: 125/75Pulse: 81Pulse from  O2 sat: n/aResp: (!) 22Temp: 99.3 ?F (37.4 ?C)Temp src: TemporalSpO2: (!) 89 % BP (!) 147/80  - Pulse 61  - Temp 97.9 ?F (36.6 ?C) (Oral)  - Resp (!) 22  - Ht 5' 11 (1.803 m)  - Wt 74.4 kg (164 lb 0.4 oz)  - SpO2 97%  - BMI 22.88 kg/m? Physical Exam ProceduresAttestation/Critical CareCritical care provided by attending: no critical carePatient progress: stableStroke DocumentationStroke/TIA: NoClinical Impressions as of 02/09/22 2345 COVID-19  ED DispositionAdmit Halina Andreas, MD12/30/23 [phone]

## 2022-02-11 MED ORDER — MAGNESIUM SULFATE 2 GRAM/50 ML (4 %) IN WATER INTRAVENOUS PIGGYBACK
2504 gram/50 mL (4 %) | Freq: Once | INTRAVENOUS | Status: CP
Start: 2022-02-11 — End: ?
  Administered 2022-02-11: 18:00:00 2 mL/h via INTRAVENOUS

## 2022-02-11 MED ORDER — HYDRALAZINE 10 MG TABLET
10 mg | Freq: Once | ORAL | Status: CP
Start: 2022-02-11 — End: ?
  Administered 2022-02-12: 04:00:00 10 mg via ORAL

## 2022-02-11 NOTE — Plan of Care
Problem: Adult Inpatient Plan of CareGoal: Readiness for Transition of CareOutcome: Initial problem identification Plan of Care Overview/ Patient Status    Case Management Screening  Flowsheet Row Most Recent Value Case Management Screening: Chart review completed. If YES to any question below then proceed to CM Eval/Plan  Is there a change in their cognitive function No Do you anticipate a change in this patient's physicial function that will effect discharge needs? No Has there been a readmission within the last 30 days No Were there services prior to admission ( Examples: Assisted Living, HD, Homecare, Extended Care Facility, Methadone, SNF, Outpatient Infusion Center) No Negative/Positive Screen Negative Screening: Case Management department will follow patient's progress and discuss plan of care with treatment team. Case Manager Attestation  I have reviewed the medical record and completed the above screen. CM staff will follow patient's progress and discuss the plan of care with the Treatment Team. Yes  This patient has been identified as meeting the COPD IP pathway program's criteria and will be followed by the Care Management Department throughout the hospitalization.Has social determinates or any potential barriers to a successful D/C transition been identified during initial assessment? : No,     Does patient have a Pulmonologist?: No Is patient appropriate for the Meds to Beds program? ([facility] and [facility] ONLY): NoRequest Palliative Care Consult for patients with ALL of the following:>2 admissions for COD in 12 months andChronic home O2 use with refractory symptoms andConcurrent Advanced co-morbidities andProgressive declining functional statusand A history of respiratory failure with intubation or NIPPVRefer/Request for ancillary services for any of the following criteria:Request a referral to SW for issues identified above.Recommend PT/OT Evaluation as neededDiscuss referral to a pulmonary rehab program upon discharge (in-home, out-patient or inpatient)Schedule PCP post-hospitalization visit ([facility]- [facility])Determine the need for Homecare referral in collaboration with PT/OT and provider or if the patient will have new home O2, nebulizer, bipap or cpap requirements.Discuss if overnight pulse Ox is neededIf new O2 prescription needed nurse must obtain: Room Air )s sat at Rest, and with Exertion, O2 sat on prescribed LPM of O2 with ExertionEnsure patietn and Family member understands and agrees with the discharge plan of care.Mr. 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 Patient can be forgetfulCM will follow for DC needs Assessment screening completed. Continue to follow patient's progress and discuss plan of care with treatment team.  Discharge needs not determined at this time. Care Management will continue to follow with team. Nicolette Bang RN [name] ManagementMHB [phone]

## 2022-02-11 NOTE — Plan of Care
Problem: Adult Inpatient Plan of CareGoal: Plan of Care ReviewOutcome: Interventions implemented as appropriateGoal: Patient-Specific Goal (Individualized)Outcome: Interventions implemented as appropriateGoal: Absence of Hospital-Acquired Illness or InjuryOutcome: Interventions implemented as appropriateGoal: Optimal Comfort and WellbeingOutcome: Interventions implemented as appropriateGoal: Readiness for Transition of CareOutcome: Interventions implemented as appropriate Problem: InfectionGoal: Absence of Infection Signs and SymptomsOutcome: Interventions implemented as appropriate Problem: Wound Healing ProgressionGoal: Optimal Wound HealingOutcome: Interventions implemented as appropriate Problem: Fall Injury RiskGoal: Absence of Fall and Fall-Related InjuryOutcome: Interventions implemented as appropriate Plan of Care Overview/ Patient Status    Assumed care at 1900. Pt is alert and oriented x 4, able to make his needs known. On special isolation for Covid. Pt is on 3l of oxygen saturating at 96%. IV fluids running at 100 ml/hr, tolerating well. Continues on IV Remdesivir, no adverse effects noted. Denies any chest pain, tele monitoring sinus brady rate in the 50's. Pt is continent, using urinal at bedside. Ambulates to the bathroom with stand by assist. Call bell in reach, hourly monitoring maintained.

## 2022-02-11 NOTE — Progress Notes
[place] CAMPUSPROGRESS NOTE Patient Name: Raymond Gardner of Birth: 1942-05-16MRN: [id number] SUBJECTIVE Patient seen and examined.  Laying in bed comfortable no acute distress.  Clinically improving.  Still requiring 2-1/2 liters nasal cannula saturating 96%.  No cough shortness of breath better.  He is afebrile.INPATIENT MEDICATIONS Current Facility-Administered Medications Medication Dose Route Frequency Provider Last Rate Last Admin ? aspirin EC delayed release tablet 81 mg  81 mg Oral Daily Nicky Pugh, DO   81 mg at 02/11/22 4098 ? cyanocobalamin tablet 100 mcg  100 mcg Oral Daily Chauncey Reading, MD   100 mcg at 02/11/22 1191 ? dexAMETHasone (DECADRON) tablet 6 mg  6 mg Oral Q24H Constantinescu, Erma Heritage, MD   6 mg at 02/10/22 2100 ? enoxaparin (LOVENOX) syringe 40 mg  40 mg Subcutaneous Daily Nicky Pugh, DO   40 mg at 02/11/22 4782 ? famotidine (PEPCID) tablet 20 mg  20 mg Oral Daily Nicky Pugh, DO   20 mg at 02/11/22 9562 ? folic acid (FOLVITE) tablet 1 mg  1 mg Oral Daily Chauncey Reading, MD   1 mg at 02/11/22 1308 ? losartan (COZAAR) tablet 50 mg  50 mg Oral Daily Nicky Pugh, DO   50 mg at 02/11/22 6578 ? magnesium sulfate in water 2 gram/50 mL (4 %) (IVPB) 2 g  2 g Intravenous Once Quest Tavenner, MD     ? montelukast (SINGULAIR) tablet 10 mg  10 mg Oral Nightly Nicky Pugh, DO   10 mg at 02/10/22 2110 ? remdesivir 100 mg in sodium chloride 0.9% 250 mL (vialmate)  100 mg Intravenous Q24H Nicky Pugh, DO 500 mL/hr at 02/10/22 2106 100 mg at 02/10/22 2106 ? rosuvastatin (CRESTOR) tablet 20 mg  20 mg Oral Daily Nicky Pugh, DO   20 mg at 02/11/22 4696 ? sertraline (ZOLOFT) tablet 50 mg  50 mg Oral Daily Nicky Pugh, DO   50 mg at 02/11/22 2952 ? sodium chloride 0.9 % flush 3 mL  3 mL IV Push Q8H Nicky Pugh, DO   3 mL at 02/10/22 2111 ? tiotropium bromide (SPIRIVA RESPIMAT) 2.5 mcg/actuation inhalation spray 2 Inhalation   2 Inhalation  Inhalation Daily Chauncey Reading, MD     ? sodium chloride 100 mL/hr (02/10/22 1007) PRN Meds:acetaminophen, albuterol sulfate, albuterol sulfate, ondansetron **OR** ondansetron (ZOFRAN) IV Push, polyethylene glycol, sodium chlorideOBJECTIVE Vital signs in last 24 hours:Temp:  [97.8 ?F (36.6 ?C)-98 ?F (36.7 ?C)] 97.8 ?F (36.6 ?C)Pulse:  [59-63] 59Resp:  [18] 18BP: (147-159)/(67-80) 159/80SpO2:  [96 %] 96 %I/O's:Gross Totals (Last 24 hours) at 02/11/2022 1116Last data filed at 02/11/2022 0700Intake 1930 ml Output 545 ml Net 1385 ml Urinary Catheter:  APPEARANCE: Appropriate for age. No Acute Distress. AOx3 with appropriate affect. HEENT: normocephalic, atraumatic. PERRLA, anicteric. Oropharynx mucosa moist, without swelling, erythema, or exudate.NECK: Supple. No evidence of thyromegaly. No JVD. No Bruits.CHEST:  Decreased breath sound negative for any wheezes rales or rhonchiCARDIOVASCULAR: RRR, S1/S2,  no S3/S4,  positive for systolic murmur, no G/R. ABDOMEN: Abdomen soft, non-tender, non-distended with no masses, hepatosplenomegaly or hernias.  No CVA tenderness. Normoactive bowel sounds. EXTREMITIES:  Negative for edemaSKIN: No rashes, lesions, or subcutaneous nodules. NEUROLOGIC:  No focal finding RECENT LABSRecent Labs   12/30/230713 12/31/230734 WBC 12.1* 9.4 HGB 14.5 13.3 HCT 42.10 39.70 PLT 143* 136* MCV 102.9* 104.7*  Recent Labs   12/30/230713 12/31/230735 NA 137 137 K 4.6 4.2 CL 104 104 CO2 24 25 BUN 25* 25* CREATININE 1.35* 1.31* GLU 134* 130* ANIONGAP 9 8 CALCIUM 8.1*  7.7* MG 1.7* 1.7*  Recent Labs   12/30/230713 12/31/230735 AST 24 20 ALT 17 18 ALKPHOS 64 52 ALBUMIN 2.7* 2.4* PROT 6.3 5.4* BILITOT 0.3 0.3  No results for input(s): LABPROT, INR, PTT, DDIMER in the last 72 hours. No results for input(s): TROPONINI in the last 72 hours.Invalid input(s): PROBNP No results for input(s): CORTISOL, HGBA1C, TSH, FREET4, LDL, HDL, CHOL, TRIG in the last 72 hours. Recent Labs Lab 12/29/230635 SPECGRAV 1.015 PHUR 6.5 LEUKOCYTESUR Negative NITRITE Negative PROTEINUA 1+* GLUCOSEU 1+* KETONESU Trace BILIRUBINUR Negative UROBILINOGEN <2.0 IMAGINGNo results found.[facility] results found for this or any previous visit.EKGResults for orders placed or performed during the hospital encounter of 02/07/22 EKG Result Value Ref Range  Heart Rate 75 bpm  QRS Interval 107 ms  QT Interval 375 ms  QTC Interval 419 ms  P Axis 53 deg  QRS Axis 86 deg  T Wave Axis 58 deg  P-R Interval 164 msec  SEVERITY Otherwise Normal ECG severity MICROBIOLOGY Blood CulturesRecent Labs Lab 12/28/231639 LABBLOO No Growth to Date - No Growth to Date  Urine CulturesNo results for input(s): LABURIN in the last 168 hours.Respiratory Cultures No results for input(s): LOWERRESPIRA in the last 168 hours. ASSESSMENT AND PLAN 82 y.o. male admitted with the following active issues:Impression:1. Acute respiratory failure 2. COVID pneumonia 3. AKI4. Hypomagnesemia 5. Murmur Plan: In regards to acute respiratory failure with hypoxemia likely secondary to COVID pneumonia.  Wean oxygen as tolerated to maintain O2 sat above 92%.  Continue with COVID treatment.  In regards to COVID pneumonia, continue with remdesivir day 4/5, Decadron day 4/7.  Wean oxygen as tolerated.  Monitor temperature WBC.  In regards to AKI renal function slowly improving currently creatinine at 1.31.In regards to hypomagnesemia being repleted with IV magnesium.In regards to heart murmur, advised to follow-up with cardiology as an outpatient.Diet: Diet CardiacVTE Prophylaxis: Phamocologic thromboprophylaxis initiatedCode Status: FULL CODE/ACLS PROTOCOL.Signed:Jakiah Bienaime, MDDepartment of Medicine1/02/2022

## 2022-02-12 LAB — CBC WITH AUTO DIFFERENTIAL
BKR WAM ABSOLUTE IMMATURE GRANULOCYTES.: 0.04 x 1000/??L (ref 0.00–0.30)
BKR WAM ABSOLUTE LYMPHOCYTE COUNT.: 1.4 x 1000/ÂµL (ref 0.60–3.70)
BKR WAM ABSOLUTE NRBC (2 DEC): 0 x 1000/ÂµL (ref 0.00–1.00)
BKR WAM ANALYZER ANC: 8.42 x 1000/ÂµL — ABNORMAL HIGH (ref 2.00–7.60)
BKR WAM BASOPHIL ABSOLUTE COUNT.: 0.01 x 1000/ÂµL (ref 0.00–1.00)
BKR WAM BASOPHILS: 0.1 % (ref 0.0–1.4)
BKR WAM EOSINOPHIL ABSOLUTE COUNT.: 0.01 x 1000/ÂµL (ref 0.00–1.00)
BKR WAM EOSINOPHILS: 0.1 % — ABNORMAL LOW (ref 0.0–5.0)
BKR WAM HEMATOCRIT (2 DEC): 44 % (ref 38.50–50.00)
BKR WAM HEMOGLOBIN: 15.5 g/dL — ABNORMAL LOW (ref 13.2–17.1)
BKR WAM IMMATURE GRANULOCYTES: 0.4 % (ref 0.0–1.0)
BKR WAM LYMPHOCYTES: 12.8 % — ABNORMAL LOW (ref 17.0–50.0)
BKR WAM MCH (PG): 35.4 pg — ABNORMAL HIGH (ref 27.0–33.0)
BKR WAM MCHC: 35.2 g/dL (ref 31.0–36.0)
BKR WAM MCV: 100.5 fL — ABNORMAL HIGH (ref 80.0–100.0)
BKR WAM MONOCYTE ABSOLUTE COUNT.: 1.07 x 1000/ÂµL — ABNORMAL HIGH (ref 0.00–1.00)
BKR WAM MONOCYTES: 9.8 % — ABNORMAL LOW (ref 4.0–12.0)
BKR WAM MPV: 11.3 fL (ref 8.0–12.0)
BKR WAM NEUTROPHILS: 76.8 % — ABNORMAL HIGH (ref 39.0–72.0)
BKR WAM NUCLEATED RED BLOOD CELLS: 0 % (ref 0.0–1.0)
BKR WAM PLATELETS: 146 x1000/ÂµL — ABNORMAL LOW (ref 150–420)
BKR WAM RDW-CV: 12 % — ABNORMAL HIGH (ref 11.0–15.0)
BKR WAM RED BLOOD CELL COUNT.: 4.38 M/??L (ref 4.00–6.00)
BKR WAM WHITE BLOOD CELL COUNT: 1.07 x 1000/??L — ABNORMAL HIGH (ref 0.00–1.00)
BKR WAM WHITE BLOOD CELL COUNT: 11 x1000/??L (ref 4.0–11.0)

## 2022-02-12 LAB — COMPREHENSIVE METABOLIC PANEL
BKR A/G RATIO: 0.8 — ABNORMAL LOW (ref 1.0–2.2)
BKR ALANINE AMINOTRANSFERASE (ALT): 23 U/L — ABNORMAL LOW (ref 14–63)
BKR ALBUMIN: 2.5 g/dL — ABNORMAL LOW (ref 3.4–5.0)
BKR ALKALINE PHOSPHATASE: 47 U/L (ref 46–116)
BKR ANION GAP: 7 (ref 5–16)
BKR ASPARTATE AMINOTRANSFERASE (AST): 18 U/L (ref 10–42)
BKR AST/ALT RATIO: 0.8
BKR BILIRUBIN TOTAL: 0.4 mg/dL (ref 0.2–1.2)
BKR BLOOD UREA NITROGEN: 29 mg/dL — ABNORMAL HIGH (ref 7–18)
BKR BUN / CREAT RATIO: 22.5 (ref 8.0–23.0)
BKR CALCIUM: 7.9 mg/dL — ABNORMAL LOW (ref 8.5–10.5)
BKR CHLORIDE: 100 mmol/L (ref 98–107)
BKR CO2: 26 mmol/L (ref 21–32)
BKR CREATININE: 1.29 mg/dL (ref 0.80–1.30)
BKR EGFR, CREATININE (CKD-EPI 2021): 56 mL/min/{1.73_m2} — ABNORMAL LOW (ref >=60)
BKR GLOBULIN: 3.2 g/dL (ref 2.3–3.5)
BKR GLUCOSE: 123 mg/dL — ABNORMAL HIGH (ref 70–100)
BKR POTASSIUM: 4.2 mmol/L (ref 3.5–5.1)
BKR PROTEIN TOTAL: 5.7 g/dL — ABNORMAL LOW (ref 6.0–8.0)
BKR SODIUM: 133 mmol/L — ABNORMAL LOW (ref 136–145)

## 2022-02-12 LAB — PHOSPHORUS     (BH GH L LMW YH): BKR PHOSPHORUS: 3.6 mg/dL (ref 2.5–4.9)

## 2022-02-12 LAB — MAGNESIUM: BKR MAGNESIUM: 1.7 mg/dL — ABNORMAL LOW (ref 1.8–2.5)

## 2022-02-12 NOTE — Plan of Care
Plan of Care Overview/ Patient Status    SOCIAL WORK NOTEPatient Name: Nebiyu Gerhart Record Number: [id number] Date of Birth: [birth year]Medical Social Work Follow Up  [employer] Most Recent Value Admission Information  Document Type Progress Note (For Inpatient/ED Only) Prior psychosocial assessment has been documented within this hospitalization No (For Inpatient/ED Only) Prior psychosocial assessment has been documented within 30 days of this hospitalization No Reason for Current Social Work Involvement Support/Coping Source of Information Patient Record Reviewed Yes Level of Care Inpatient Psychosocial issues requiring intervention Social Work involvement to complete social work admission screen and to provide support. Psychosocial interventions 15 minutes spent in phone contact with Dorinda Hill who was admitted for COVID-19.  I introduced myself, role and reason for my call.  Xaylen presented as alert and oriented, very pleasant and engaging.  I provided active and empathetic listening as he shared that he moved from [place] to [place] after his wife passed away three years ago.  I gave him my condolences.  He stated that his son [name] invited him to live with him and his family.  He reported having good family support including his other son who lives in [place].  He indicated that when his wife passed away he had a difficult time emotionally, however, he has been coping well being with his son.  He does not feel depressed or anxious and expressed that he was not interested in any counseling service.  When asked about drinking, he openly shared that he drinks a glass of wine with his dinner.  This has been his lifestyle for many years and does not find it to be problematic.  He voiced no additional need for support and was appreciative of th call. Collaborations None Specific referrals to enhance community supports (include existing and new resources) None Handoff Required? No Next Steps/Plan (including hand-off): Social work admission screen is complete.  No barriers to discharge identified. No further social work intervention indicated.  Please re-consult if additional needs arise. Signature: Darl Pikes, [place] Contact Information: [phone]  Darl Pikes, LCSWMilford [facility] [phone]

## 2022-02-12 NOTE — Plan of Care
Inpatient Physical Therapy Evaluation IP Adult PT Eval/Treat - 02/12/22 0926    Date of Visit / Treatment  Date of Visit / Treatment 02/12/22   (1/4)  Note Type Evaluation   Start Time 0858   End Time 0926   Total Treatment Time 28    General Information  Pertinent History Of Current Problem Patient is an 82 year old male evaluated and treated following admission for Acute respiratory failure, COVID pneumonia, AKI,  Hypomagnesemia and Murmur.  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   Subjective I'm feeling better.   Referring Physician  Dr. Durene Cal   General Observations Patient received supine in bed, agreeable to participate in PT Evaluation.   Precautions/Limitations Fall Precautions;Bed alarm    Prior Level of Functioning/Social History  Prior Level of Function independent with mobility   Patient resides with: Adult Child(ren)   Type of Home 2 story house   Home Setup Steps to enter with rail(s)   Equipment Utilized Prior to Admission/Treatment No device   Additional Comments PTA, patient lived with son and his family with 2 STE and 4 stairs down to his own apatment.  Patient ambulated independently with no AD.    Vital Signs and Orthostatic Vital Signs  Vital Signs Free text With activity, on RA, spO2= 91%.    Pain/Comfort  Location #1 - PreTreatment Rating (Numbers Scale) 0/10 - no pain   Posttreatment Rating (Numbers Scale) 2/10   Pain Location - Side Right   Pain Location hip   Pain Comment (Pre/Post Treatment Pain) Nursing made aware.    Patient Coping  Observed Emotional State accepting;cooperative    Cognition  Overall Cognitive Status WFL    Range of Motion  Range of Motion Comments BLE ROM WFL    Musculoskeletal  LLE Muscle Strength Grading 4-->active movement against gravity and resistance   RLE Muscle Strength Grading 4-->active movement against gravity and resistance    Sensory Assessment  Sensory Assessment Comments Patient with no reports of numbness and tingling    Skin Assessment  Skin Assessment See Nursing Documentation    Balance  Sitting Balance: Static  GOOD     Maintains static position against moderate resistance with no Assistive Device   Sitting Balance: Dynamic  GOOD-   Independent in dynamic balance activities (ambulates on level surfaces with no Assistive Device, picks up object from floor in sitting)   Standing Balance: Static FAIR       Maintains static position without assist or device, may require Supervision or Verbal Cues (<2 minutes)   Standing Balance: Dynamic  FAIR-     Performs dynamic activities through partial range (50-75%) with Contact Guard   Balance Assist Device Gait belt    Bed Mobility  Supine-to-Sit Independence/Assistance Level Supervision   Supine-to-Sit Assist Device Bed rails   Sit-to-Supine Independence/Assistance Level Supervision   Sit-to-Supine Assist Device Bed rails   Limitations decreased ability to use legs for bridging/pushing   Bed Mobility, Impairments strength decreased;impaired balance;pain   Bed mobility was limited by: pain    Sit-Stand Transfer Training  Sit-to-Stand Transfer Independence/Assistance Level Supervision   Sit-to-Stand Transfer Assist Device Gait belt   Stand-to-Sit Transfer Independence/Assistance Level Supervision   Stand-to-Sit Transfer Assist Device Gait belt   Transfer Safety Analysis Concerns decreased step length   Transfer Safety Analysis Impairments impaired balance;decreased strength;pain   Transfers were limited by: pain   Sit-Stand Transfer Comments  Patient requires some verbal and tactile cues for hand placement.    Gait Training  Independence/Assistance Level  Stand-by assist   Assistive Device  Gait belt   Gait Distance 50 feet   Gait Pattern Analysis step through gait Gait Analysis Deviations decreased cadence   Gait Analysis Impairments impaired balance;pain;decreased strength   Gait Training Comments Patient requires some verbal cues for step length and safety.    Handoff Documentation  Handoff Patient instructed to call nursing for mobility;Discussed with nursing;Bed alarm;Patient in bed   Handoff Comments Patient left on RA and RN made aware.    PT- AM-PAC - Basic Mobility Screen- How much help from another person do you currently need.....  Turning from your back to your side while in a a flat bed without using rails? 3 - A Little - Requires a little help (supervision, minimal assistance). Can use assistive devices.   Moving from lying on your back to sitting on the side of a flat bed without using bed rails? 3 - A Little - Requires a little help (supervision, minimal assistance). Can use assistive devices.   Moving to and from a bed to a chair (including a wheelchair)? 3 - A Little - Requires a little help (supervision, minimal assistance). Can use assistive devices.   Standing up from a chair using your arms(e.g., wheelchair or bedside chair)? 3 - A Little - Requires a little help (supervision, minimal assistance). Can use assistive devices.   To walk in a hospital room? 3 - A Little - Requires a little help (supervision, minimal assistance). Can use assistive devices.   Climbing 3-5 steps with a railing? 3 - A Little - Requires a little help (supervision, minimal assistance). Can use assistive devices.   AMPAC Mobility Score 18   TARGET Highest Level of Mobility Mobility Level 6, Walk 10+steps   ACTUAL Highest Level of Mobility Mobility Level 7, Walk 25+ feet    Lower Extremity Exercises  Hip Straight leg raise;Glute sets   Knee Quad set   Ankle Ankle pumps    Clinical Impression  Rehab Diagnosis --   Initial Assessment Patient presents for evaluation and treatment following admission for Acute respiratory failure, COVID pneumonia, AKI,  Hypomagnesemia and Murmur.  Patient with decreased strength, decreased balance, decreased activity tolerance and R hip pain, which limits all functional mobility.  Patient educated with HEP, POC, safety, PLB and energy conservation strategies with good carryover noted.  Recommend patient OOB with nursing staff and continued inpatient PT services to increase all functional mobility.  Recommend patient to discharge  to home once medically stable with family support.   Criteria for Skilled Therapeutic Interventions Met yes;treatment indicated   Impairments Found (describe specific impairments) gait;balance;mobility   Functional Limitations in Following Categories (Describe Specific Limitations) mobility   Rehab Potential good, to achieve stated therapy goals    Patient/Family Stated Goals  Patient/Family Stated Goal(s) return home;return to prior level of functioning    Frequency/Equipment Recommendations  PT Frequency 4x per week   Tuesday  What day of week is next treatment expected? Wednesday   Equipment Needs During Admission/Treatment Gait belt    PT Recommendations for Inpatient Admission  Activity/Level of Assist out of bed;ambulate;gait belt;supervision   Therapeutic Exercise encourage exercise program issued    PT Discharge Summary  Physical Therapy Disposition Recommendation Home   Additional Physical Therapy Disposition Recommendations Home with family support   Equipment Recommendations for Discharge No durable medical equipment needed     Plan  of Care Overview/ Patient Status    Problem: Physical Therapy GoalsGoal: Physical Therapy GoalsDescription: 1.  Patient will complete supine to sit independently.2.  Patient will complete sit to stand transfers independently.3.  Patient will ambulate 300 ft with no AD independently4.  Patient will ascend/descend 4 steps with single rail and mod I.  Outcome: Initial problem identification Toribio Harbour, PT, DPTMobile [facility] [phone]

## 2022-02-12 NOTE — Plan of Care
Problem: Adult Inpatient Plan of CareGoal: Plan of Care ReviewOutcome: Interventions implemented as appropriateFlowsheetsTaken 02/12/2022 1434Progress: no changeTaken 02/09/2022 1548Plan of Care Reviewed With: patientGoal: Patient-Specific Goal (Individualized)Outcome: Interventions implemented as appropriateGoal: Absence of Hospital-Acquired Illness or InjuryOutcome: Interventions implemented as appropriateGoal: Optimal Comfort and WellbeingOutcome: Interventions implemented as appropriateGoal: Readiness for Transition of CareOutcome: Interventions implemented as appropriate Plan of Care Overview/ Patient Status    Assumed care @ 0700:Patient a&Ox4.No reports of pain.On 2 L @ beginning of shift, patient weaned to room air, O2 saturation 94%, patient reporting shortness of breath.Tolerated all medications well.Ate well at meal times.Remains on isolation precautions.Cont of b+bAmbulating in room independently.Performs adl care w/ setup assistance provided.Call bell within reach.

## 2022-02-12 NOTE — Plan of Care
Plan of Care Overview/ Patient Status    SOCIAL WORK SCREENINGPatient Name: Dmani Eckerd Record Number: [id number] Date of Birth: [birth year]Flowsheet Rows  Flowsheet Row Most Recent Value SW Admission Screening  Incomplete emergency contact or next of kin on record No Patient identity unknown No Uninsured/under-insured  No Group home or residential placement No Guardianship or conservatorship No Readmission within 30 days No International residency or residence outside of [place] No Currently on Dialysis No Active Interpersonal Violence/Sexual Assault No Concern for current abuse or neglect No Concern for current homelessness No Current behavioral health concerns No Concern for active substance use No Screening Result Negative: No current psychosocial barriers to discharge identified at this time.  Darl Pikes, LCSWMilford Hodges Care [facility] [phone]

## 2022-02-12 NOTE — Plan of Care
Problem: Adult Inpatient Plan of CareGoal: Plan of Care ReviewOutcome: Interventions implemented as appropriateGoal: Patient-Specific Goal (Individualized)Outcome: Interventions implemented as appropriateGoal: Absence of Hospital-Acquired Illness or InjuryOutcome: Interventions implemented as appropriateGoal: Optimal Comfort and WellbeingOutcome: Interventions implemented as appropriateGoal: Readiness for Transition of CareOutcome: Interventions implemented as appropriate Plan of Care Overview/ Patient Status    Assumed pt care 1900-0700Patient is alert and oriented x4. VS as documented, spO2 95% 3L NC, denies SOB and no respiratory distress noted. BP elevated 173/82,  asymptomatic, APRN Igboeli notified and one time dose of hydralazine ordered. On tele, sinus brady 40-50s, denies chest pain or palpitations. No reports of pain during shift. Medications administered per MAR. IV patent to R arm. Continent x2, urinal at bedside. Dressing to RLE C/D/I. Special respiratory precautions maintained. Call light and personal items in reach. Safety maintained. POC continued

## 2022-02-12 NOTE — Progress Notes
[facility] NOTE Patient Name: Raymond Gardner of Birth: 07/10/1942MRN: [id number] SUBJECTIVE Patient seen and examined.  Laying in bed no acute distress.  On room air saturating 90%.  Worked with physical therapy minimal activity on room air showing O2 sat around 91%.  Overall patient feels improved.  Although he does admit to feeling weak.INPATIENT MEDICATIONS Current Facility-Administered Medications Medication Dose Route Frequency Provider Last Rate Last Admin ? aspirin EC delayed release tablet 81 mg  81 mg Oral Daily Raymond Pugh, DO   81 mg at 02/12/22 4098 ? cyanocobalamin tablet 100 mcg  100 mcg Oral Daily Raymond Reading, MD   100 mcg at 02/12/22 1191 ? dexAMETHasone (DECADRON) tablet 6 mg  6 mg Oral Q24H Raymond Gardner, Raymond Heritage, MD   6 mg at 02/11/22 2200 ? enoxaparin (LOVENOX) syringe 40 mg  40 mg Subcutaneous Daily Raymond Pugh, DO   40 mg at 02/12/22 0854 ? famotidine (PEPCID) tablet 20 mg  20 mg Oral Daily Raymond Pugh, DO   20 mg at 02/12/22 4782 ? folic acid (FOLVITE) tablet 1 mg  1 mg Oral Daily Raymond Gardner, Raymond Heritage, MD   1 mg at 02/12/22 9562 ? losartan (COZAAR) tablet 50 mg  50 mg Oral Daily Raymond Pugh, DO   50 mg at 02/12/22 1308 ? montelukast (SINGULAIR) tablet 10 mg  10 mg Oral Nightly Raymond Pugh, DO   10 mg at 02/11/22 2200 ? rosuvastatin (CRESTOR) tablet 20 mg  20 mg Oral Daily Raymond Pugh, DO   20 mg at 02/12/22 6578 ? sertraline (ZOLOFT) tablet 50 mg  50 mg Oral Daily Raymond Pugh, DO   50 mg at 02/12/22 4696 ? sodium chloride 0.9 % flush 3 mL  3 mL IV Push Q8H Raymond Pugh, DO   3 mL at 02/11/22 2207 ? tiotropium bromide (SPIRIVA RESPIMAT) 2.5 mcg/actuation inhalation spray 2 Inhalation   2 Inhalation  Inhalation Daily Raymond Reading, MD   2 Inhalation  at 02/12/22 [phone] ? sodium chloride 100 mL/hr (02/12/22 0742) PRN Meds:acetaminophen, albuterol sulfate, albuterol sulfate, ondansetron **OR** ondansetron (ZOFRAN) IV Push, polyethylene glycol, sodium chlorideOBJECTIVE Vital signs in last 24 hours:Temp:  [97.4 ?F (36.3 ?C)-98.1 ?F (36.7 ?C)] 98.1 ?F (36.7 ?C)Pulse:  [47-69] 69Resp:  [16-20] 18BP: (148-173)/(82-92) 153/82SpO2:  [93 %-96 %] 94 %I/O's:Gross Totals (Last 24 hours) at 02/12/2022 1525Last data filed at 02/12/2022 1300Intake 1041.23 ml Output 2025 ml Net -983.77 ml Urinary Catheter:  APPEARANCE: Appropriate for age. No Acute Distress. AOx3 with appropriate affect. HEENT: normocephalic, atraumatic. PERRLA, anicteric. Oropharynx mucosa moist, without swelling, erythema, or exudate.NECK: Supple. No evidence of thyromegaly. No JVD. No Bruits.CHEST:  Decreased breath sounds, scattered rhonchi.CARDIOVASCULAR: RRR, S1/S2,  no S3/S4,  no M/G/R. ABDOMEN: Abdomen soft, non-tender, non-distended with no masses, hepatosplenomegaly or hernias.  No CVA tenderness. Normoactive bowel sounds. EXTREMITIES:  Negative for edemaSKIN: No rashes, lesions, or subcutaneous nodules. NEUROLOGIC:  No focal finding RECENT LABSRecent Labs   12/31/230734 WBC 9.4 HGB 13.3 HCT 39.70 PLT 136* MCV 104.7*  Recent Labs   12/31/230735 NA 137 K 4.2 CL 104 CO2 25 BUN 25* CREATININE 1.31* GLU 130* ANIONGAP 8 CALCIUM 7.7* MG 1.7*  Recent Labs   12/31/230735 AST 20 ALT 18 ALKPHOS 52 ALBUMIN 2.4* PROT 5.4* BILITOT 0.3  No results for input(s): LABPROT, INR, PTT, DDIMER in the last 72 hours. No results for input(s): TROPONINI in the last 72 hours.Invalid input(s): PROBNP No results for input(s): CORTISOL, HGBA1C, TSH, FREET4, LDL, HDL, CHOL, TRIG in the last 72 hours.  Recent Labs Lab 12/29/230635 SPECGRAV 1.015 PHUR 6.5 LEUKOCYTESUR Negative NITRITE Negative PROTEINUA 1+* GLUCOSEU 1+* KETONESU Trace BILIRUBINUR Negative UROBILINOGEN <2.0 IMAGINGNo results found.[facility] results found for this or any previous visit.EKGResults for orders placed or performed during the hospital encounter of 02/07/22 EKG Result Value Ref Range  Heart Rate 75 bpm  QRS Interval 107 ms  QT Interval 375 ms  QTC Interval 419 ms  P Axis 53 deg  QRS Axis 86 deg  T Wave Axis 58 deg  P-R Interval 164 msec  SEVERITY Otherwise Normal ECG severity MICROBIOLOGY Blood CulturesRecent Labs Lab 12/28/231639 LABBLOO No Growth to Date - No Growth to Date  Urine CulturesNo results for input(s): LABURIN in the last 168 hours.Respiratory Cultures No results for input(s): LOWERRESPIRA in the last 168 hours. ASSESSMENT AND PLAN 82 y.o. male admitted with the following active issues:Impression:1. Acute respiratory failure 2. COVID pneumonia 3. AKI4. Hypomagnesemia 5. Murmur ?Plan: In regards to acute respiratory failure with hypoxemia likely secondary to COVID pneumonia.  Weaned off oxygen on room air saturating 91 to 92%.   Continue with COVID treatment.  ?In regards to COVID pneumonia, continue with remdesivir day 5/5, Decadron day 5/7.  Wean oxygen as tolerated.  Monitor temperature WBC.  ?In regards to AKI renal function slowly improving will recheck chemistries now.?In regards to hypomagnesemia being repleted with IV magnesium.?In regards to heart murmur, advised to follow-up with cardiology as an outpatient.Likely discharge home tomorrow morning.Diet: Diet CardiacVTE Prophylaxis: Phamocologic thromboprophylaxis initiatedCode Status: FULL CODE/ACLS PROTOCOL.Signed:Darrah Gardner, MDDepartment of Medicine1/03/2022

## 2022-02-12 NOTE — Plan of Care
Plan of Care Overview/ Patient Status    Assumed care of patient at 0700.Neuro: Alert and oriented, calm and cooperative with care. Respiratory: 3L of O2 via NC. SpO2 94% on room air. SpO2 dropped to 88 with ambulation. Reapplied O2 per MD Dalipi. Cardiac: telemetry monitoring HR 50s, denies chest pain or discomfort. GI/GU: urinal within reach. Mobility: OOB assist x 1 contact guardPain: No reported pain or discomfort. Diet: cardiac, pills whole. IV Access: PIV patent to right arm med locked. Skin: Dressing in place to right lower leg. Safety precautions maintained. Safety rounds completed. Plan of care continued.

## 2022-02-13 DIAGNOSIS — F32A Depression, unspecified: Secondary | ICD-10-CM

## 2022-02-13 DIAGNOSIS — Z951 Presence of aortocoronary bypass graft: Secondary | ICD-10-CM

## 2022-02-13 DIAGNOSIS — Z87891 Personal history of nicotine dependence: Secondary | ICD-10-CM

## 2022-02-13 DIAGNOSIS — D7589 Other specified diseases of blood and blood-forming organs: Secondary | ICD-10-CM

## 2022-02-13 DIAGNOSIS — N179 Acute kidney failure, unspecified: Secondary | ICD-10-CM

## 2022-02-13 DIAGNOSIS — E872 Acidosis, unspecified: Secondary | ICD-10-CM

## 2022-02-13 DIAGNOSIS — J9601 Acute respiratory failure with hypoxia: Secondary | ICD-10-CM

## 2022-02-13 DIAGNOSIS — I2510 Atherosclerotic heart disease of native coronary artery without angina pectoris: Secondary | ICD-10-CM

## 2022-02-13 DIAGNOSIS — Z85828 Personal history of other malignant neoplasm of skin: Secondary | ICD-10-CM

## 2022-02-13 DIAGNOSIS — Z7982 Long term (current) use of aspirin: Secondary | ICD-10-CM

## 2022-02-13 DIAGNOSIS — A419 Sepsis, unspecified organism: Secondary | ICD-10-CM

## 2022-02-13 DIAGNOSIS — K449 Diaphragmatic hernia without obstruction or gangrene: Secondary | ICD-10-CM

## 2022-02-13 DIAGNOSIS — Z79899 Other long term (current) drug therapy: Secondary | ICD-10-CM

## 2022-02-13 DIAGNOSIS — J440 Chronic obstructive pulmonary disease with acute lower respiratory infection: Secondary | ICD-10-CM

## 2022-02-13 DIAGNOSIS — R011 Cardiac murmur, unspecified: Secondary | ICD-10-CM

## 2022-02-13 DIAGNOSIS — J1282 Pneumonia due to coronavirus disease 2019: Secondary | ICD-10-CM

## 2022-02-13 DIAGNOSIS — E785 Hyperlipidemia, unspecified: Secondary | ICD-10-CM

## 2022-02-13 DIAGNOSIS — I10 Essential (primary) hypertension: Secondary | ICD-10-CM

## 2022-02-13 LAB — CBC WITH AUTO DIFFERENTIAL
BKR ALANINE AMINOTRANSFERASE (ALT): 0 % (ref 0.0–1.0)
BKR CALCIUM: 86.2 % — ABNORMAL HIGH (ref 39.0–72.0)
BKR WAM ABSOLUTE IMMATURE GRANULOCYTES.: 0.04 x 1000/ÂµL (ref 0.00–0.30)
BKR WAM ABSOLUTE LYMPHOCYTE COUNT.: 0.73 x 1000/ÂµL (ref 0.60–3.70)
BKR WAM ABSOLUTE NRBC (2 DEC): 0 x 1000/ÂµL (ref 0.00–1.00)
BKR WAM ANALYZER ANC: 6.76 x 1000/ÂµL (ref 2.00–7.60)
BKR WAM BASOPHIL ABSOLUTE COUNT.: 0 x 1000/ÂµL (ref 0.00–1.00)
BKR WAM BASOPHILS: 0 % (ref 0.0–1.4)
BKR WAM EOSINOPHIL ABSOLUTE COUNT.: 0 x 1000/ÂµL (ref 0.00–1.00)
BKR WAM HEMATOCRIT (2 DEC): 42.1 % (ref 38.50–50.00)
BKR WAM HEMOGLOBIN: 15 g/dL (ref 13.2–17.1)
BKR WAM IMMATURE GRANULOCYTES: 0.5 % (ref 0.0–1.0)
BKR WAM LYMPHOCYTES: 9.3 % — ABNORMAL LOW (ref 17.0–50.0)
BKR WAM MCH (PG): 35.3 pg — ABNORMAL HIGH (ref 27.0–33.0)
BKR WAM MCHC: 35.6 g/dL (ref 31.0–36.0)
BKR WAM MONOCYTE ABSOLUTE COUNT.: 0.31 x 1000/ÂµL (ref 0.00–1.00)
BKR WAM MONOCYTES: 4 % (ref 4.0–12.0)
BKR WAM MPV: 11.7 fL (ref 8.0–12.0)
BKR WAM NEUTROPHILS: 86.2 % — ABNORMAL HIGH (ref 39.0–72.0)
BKR WAM NUCLEATED RED BLOOD CELLS: 0 % (ref 0.0–1.0)
BKR WAM PLATELETS: 135 x1000/ÂµL — ABNORMAL LOW (ref 150–420)
BKR WAM RED BLOOD CELL COUNT.: 4.25 M/ÂµL (ref 4.00–6.00)

## 2022-02-13 LAB — COMPREHENSIVE METABOLIC PANEL
BKR A/G RATIO: 0.7 x 1000/??L — ABNORMAL LOW (ref 1.0–2.2)
BKR ALBUMIN: 2.3 g/dL — ABNORMAL LOW (ref 3.4–5.0)
BKR ALKALINE PHOSPHATASE: 49 U/L (ref 46–116)
BKR ANION GAP: 8 (ref 5–16)
BKR ASPARTATE AMINOTRANSFERASE (AST): 17 U/L (ref 10–42)
BKR AST/ALT RATIO: 0.8 x 1000/??L (ref 0.00–1.00)
BKR BILIRUBIN TOTAL: 0.6 mg/dL (ref 0.2–1.2)
BKR BLOOD UREA NITROGEN: 26 mg/dL — ABNORMAL HIGH (ref 7–18)
BKR BUN / CREAT RATIO: 21.1 % — ABNORMAL LOW (ref 8.0–23.0)
BKR CHLORIDE: 101 mmol/L (ref 98–107)
BKR CO2: 24 mmol/L — ABNORMAL HIGH (ref 21–32)
BKR CREATININE: 1.23 mg/dL (ref 0.80–1.30)
BKR EGFR, CREATININE (CKD-EPI 2021): 59 mL/min/{1.73_m2} — ABNORMAL LOW (ref >=60)
BKR GLOBULIN: 3.1 g/dL (ref 2.3–3.5)
BKR GLUCOSE: 138 mg/dL — ABNORMAL HIGH (ref 70–100)
BKR POTASSIUM: 3.9 mmol/L (ref 3.5–5.1)
BKR PROTEIN TOTAL: 5.4 g/dL — ABNORMAL LOW (ref 6.0–8.0)
BKR SODIUM: 133 mmol/L — ABNORMAL LOW (ref 136–145)
BKR WAM EOSINOPHILS: 2.3 g/dL — ABNORMAL LOW (ref 3.4–5.0)
BKR WAM MCV: 101 mmol/L (ref 98–107)
BKR WAM RDW-CV: 138 mg/dL — ABNORMAL HIGH (ref 70–100)

## 2022-02-13 LAB — BLOOD CULTURE   (BH GH L LMW YH)
BKR BLOOD CULTURE: NO GROWTH
BKR BLOOD CULTURE: NO GROWTH

## 2022-02-13 LAB — MAGNESIUM: BKR MAGNESIUM: 1.7 mg/dL — ABNORMAL LOW (ref 1.8–2.5)

## 2022-02-13 LAB — PHOSPHORUS     (BH GH L LMW YH): BKR PHOSPHORUS: 3.2 mg/dL — ABNORMAL HIGH (ref 2.5–4.9)

## 2022-02-13 MED ORDER — MAGNESIUM OXIDE 400 MG (241.3 MG MAGNESIUM) TABLET
400241.3 mg (241.3 mg magnesium) | Freq: Once | ORAL | Status: CP
Start: 2022-02-13 — End: ?
  Administered 2022-02-13: 15:00:00 400 mg (241.3 mg magnesium) via ORAL

## 2022-02-13 NOTE — Plan of Care
Problem: Adult Inpatient Plan of CareGoal: Plan of Care ReviewOutcome: Interventions implemented as appropriateGoal: Patient-Specific Goal (Individualized)Outcome: Interventions implemented as appropriateGoal: Absence of Hospital-Acquired Illness or InjuryOutcome: Interventions implemented as appropriateGoal: Optimal Comfort and WellbeingOutcome: Interventions implemented as appropriateGoal: Readiness for Transition of CareOutcome: Interventions implemented as appropriate Problem: InfectionGoal: Absence of Infection Signs and SymptomsOutcome: Interventions implemented as appropriate Plan of Care Overview/ Patient Status    Assumed pt care 1900-0700Patient is alert and oriented x4. VSS on RA, denies SOB. On tele, sinus brady w/ NSR at times, denies chest pain/discomfort. Medications administered per MAR. IVF infusing 176ml/hr. Continent x2, independent in room. Special respiratory precautions maintained. Call light and personal items in reach, safety maintained. POC continued

## 2022-02-13 NOTE — Discharge Summary
INTERNAL MEDICINE DISCHARGE SUMMARYPatient Data:  Patient Name: Raymond Gardner Age: 82 y.o. DOB: [birth year]	 MRN: [id number]	 Admit date: 12/28/2023Discharge date: 1/3/2024Discharge Attending Physician: Merdis Delay, MD  PCP: Elease Etienne, MDPrincipal Diagnosis: COVID-19Other Diagnosis: The primary encounter diagnosis was COVID-19. Diagnoses of Acute respiratory failure with hypoxemia (HC Code), AKI (acute kidney injury) (HC Code), and Heart murmur were also pertinent to this visit.Discharged Condition: goodDisposition: Home Allergies No Known Allergies PMH PSH No past medical history on file. Past Surgical History: Procedure Laterality Date ? CORONARY ARTERY BYPASS GRAFT  2010  Social History Family History Social History Tobacco Use ? Smoking status: Former   Current packs/day: 0.00   Types: Cigarettes   Quit date: 1987   Years since quitting: 37.0 ? Smokeless tobacco: Never Substance Use Topics ? Alcohol use: Yes   Comment: 7 glasses of wine per week, 1 per day  Family History Problem Relation Age of Onset ? COPD Brother  ? Coronary Artery Disease Brother   Hospital Course: 82 year old male past medical history COPD coronary artery disease status post CABG hyperlipidemia depression hiatal hernia, presented to the emergency room on 02/07/2022 with shortness of breath.  Patient was admitted with acute hypoxic respiratory failure, COVID pneumonia, AKI, hypomagnesemia and incidental finding of heart murmur.  Regards to acute hypoxic respiratory failure this was in combination of COPD and COVID pneumonia.  Patient completed course of remdesivir, Decadron.  Overall patient's condition improved, he was weaned from oxygen and currently on room air saturating 93%.  Clinically feels better.  Patient medically stable for discharge home.  In regards to AKI this resolved with gentle hydration.  Likely secondary to prerenal.In regards to hypomagnesemia this is being repleted.In regards to incidental finding of heart murmur, patient advised to follow-up with cardiology as an outpatient, information for Dr. Venetia Maxon given.  Unsure whether this is chronic or new murmur.  Patient recently moved from [place], advised to bring his medical records to the cardiologist office.  Today patient was seen and examined.  Overnight no events.  Vital signs are stable.  Patient is medically stable for discharge home.Patient was going to be discharged home with home services but was weak, deconditioned.  Patient will be discharged to rehab facility for increase strength and wean oxygen.Pertinent lab findings and test results:CBC of the weekRecent Labs Lab 12/28/231638 12/29/230717 12/30/230713 12/31/230734 01/02/241746 01/03/240649 WBC 8.5 5.5 12.1* 9.4 11.0 7.8 HGB 14.3 14.1 14.5 13.3 15.5 15.0 PLT 123* 134* 143* 136* 146* 135* MCV 104.8* 103.0* 102.9* 104.7* 100.5* 99.1  Coag of the weekRecent Labs Lab 12/28/231638 LABPROT 10.3 INR 1.02  BMP of the weekRecent Labs Lab 12/28/231812 12/29/230717 12/30/230713 12/31/230735 01/02/241746 01/03/240648 NA 136 135* 137 137 133* 133* K 3.7 4.8 4.6 4.2 4.2 3.9 CL 101 100 104 104 100 101 CO2 26 25 24 25 26 24  ANIONGAP 9 10 9 8 7 8  CALCIUM 7.9* 8.5 8.1* 7.7* 7.9* 7.6* MG 1.5* 2.0 1.7* 1.7* 1.7* 1.7* PHOS  --   --   --   --  3.6 3.2 BUN 18 20* 25* 25* 29* 26* CREATININE 1.55* 1.49* 1.35* 1.31* 1.29 1.23 Estimated Creatinine Clearance: 50 mL/min (by C-G formula based on SCr of 1.23 mg/dL). Cardiac of the weekRecent Labs Lab 12/28/231812 BNPPRO 185.7  LFT of the weekRecent Labs Lab 12/28/231812 12/30/230713 12/31/230735 01/02/241746 01/03/240648 AST 20 24 20 18 17  ALT 18 17 18 23 21  ALKPHOS 62 64 52 47 49 ALBUMIN 2.7* 2.7* 2.4* 2.5* 2.3* PROT 6.0 6.3 5.4* 5.7* 5.4* BILITOT 0.6  0.3 0.3 0.4 0.6  Endo of the weekNo results for input(s): CORTISOL, HGBA1C, TSH, FREET4, T3TOTAL, LDL, HDL, CHOL, TRIG, VITAMIND25HY in the last 168 hours. 72h GlucoseRecent Labs   01/02/241746 01/03/240648 GLU 123* 138* UA of the weekRecent Labs Lab 12/29/230635 SPECGRAV 1.015 PHUR 6.5 LEUKOCYTESUR Negative NITRITE Negative PROTEINUA 1+* GLUCOSEU 1+* KETONESU Trace BILIRUBINUR Negative UROBILINOGEN <2.0 Results for orders placed or performed during the hospital encounter of 02/07/22 EKG Result Value Ref Range  Heart Rate 75 bpm  QRS Interval 107 ms  QT Interval 375 ms  QTC Interval 419 ms  P Axis 53 deg  QRS Axis 86 deg  T Wave Axis 58 deg  P-R Interval 164 msec  SEVERITY Otherwise Normal ECG severity No results found for this or any previous visit.CTA Chest (PE) w IV ContrastResult Date: 12/28/2023CTA CHEST (PE) W IV CONTRAST INDICATION: Pulmonary embolism (PE) suspected, high prob COMPARISON: NONE TECHNIQUE: [place] images of the chest were obtained from the lung bases through the apices after the intravenous administration of contrast. Coronal and oblique 3D/MIPS reformats are provided. IV CONTRAST: 80 mL Omnipaque 350 TECHNICAL LIMITATIONS: None. FINDINGS: PULMONARY ARTERIES: There is no evidence of filling defects in the pulmonary arteries to suspect pulmonary embolism. HEART: The heart is within normal limits for size. RV/LV RATIO: The RV/LV ratio measures less than 1.  There is no reflux of contrast into the IVC or hepatic veins. SYSTEMIC VASCULATURE: Aorta and major branches are unremarkable. LUNGS: Upper lobe predominant emphysema. Right lower lobe atelectasis secondary to large adjacent hiatal hernia. AIRWAYS: Unremarkable. PLEURA: Unremarkable. 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 UPPER ABDOMEN: Limited evaluation of the upper abdomen is unremarkable. BONES & SOFT TISSUES: Unremarkable. These findings were corroborated on the MIP images.  No evidence of pulmonary embolism or acute thoracic pathology. Large hiatal hernia with intrathoracic stomach and containing nonobstructed portions of the colon. [place] [facility] Notify System Classification: Routine. Report initiated by:  Verline Lema, MD Reported and signed by: Channing Mutters, MD  [facility] ED Diagnostic Ultrasound EchoResult Date: 12/28/2023Disclaimer:Please look in ED Provider Note for the Impression.Discharge vitals: Temp:  [97.4 ?F (36.3 ?C)-98.1 ?F (36.7 ?C)] 97.5 ?F (36.4 ?C)Pulse:  [51-69] 51Resp:  [18-20] 20BP: (153-170)/(82-91) 170/83SpO2:  [93 %-94 %] 93 %SpO2:  [93 %-94 %] 93 % (01/03 0528) 35 minutes spent to complete the discharge summaryDischarge Medications: Current Discharge Medication List  CONTINUE these medications which have NOT CHANGED  Details aspirin 81 mg EC delayed release tablet every 24 hours.  ezetimibe (ZETIA) 10 mg tablet Take 1 tablet (10 mg total) by mouth daily.Qty: 90 tablet, Refills: 0  montelukast (SINGULAIR) 10 mg tablet TAKE 1 TABLET(10 MG) BY MOUTH EVERY NIGHTQty: 30 tablet, Refills: 0  omeprazole (PRILOSEC) 20 mg capsule TAKE 1 CAPSULE(20 MG) BY MOUTH DAILYQty: 30 capsule, Refills: 6  rosuvastatin (CRESTOR) 20 mg tablet Take 1 tablet (20 mg total) by mouth daily.Qty: 90 tablet, Refills: 1  sertraline (ZOLOFT) 50 mg tablet sertraline 50 mg tablet TAKE 1 TABLET BY MOUTH ONCE DAILY  tiotropium-olodateroL (STIOLTO RESPIMAT) 2.5-2.5 mcg/actuation mist for inhalation Inhale 2 Inhalation into the lungs daily.Qty: 4 g, Refills: 11  valsartan (DIOVAN) 80 mg tablet TAKE 1 TABLET(80 MG) BY MOUTH DAILYQty: 30 tablet, Refills: 0   STOP taking these medications   albuterol sulfate 90 mcg/actuation HFA aerosol inhaler    Follow-up Information:Rosinski, Anitra Lauth, MD[address] [facility] [place] [phone] an  appointment as soon as possible for a visit in 1 week(s)Seigerman, Molli Hazard [id number]Fair[address] [place] [phone] an appointment as soon as possible for a visit in 1 week(s) Electronically Signed:Frederika Hukill, MD 02/13/2022; 9:24 AM

## 2022-02-13 NOTE — Plan of Care
Plan of Care Overview/ Patient Status    Case Management Plan  Flowsheet Row Most Recent Value CM D/C Readiness  PASRR completed and approved Yes Authorization number obtained, if required N/A Is there a 3 day INPATIENT Qualifying stay for Medicare Patients? Yes Medicare IM- signed, dated, timed and scanned, if required Yes  [Per TC, Laurie] DME Authorized/Delivered N/A No needs identified/ follow up with PCP/MD N/A Post acute care services secured W10 complete Yes Pri Completed and Accepted  N/A Is the destination address correct on the W10 Yes Finalized Plan  Expected Discharge Date 02/13/22 Discharge Disposition Home or Self Care   Pt to be d/c'd to str [facility] at 12pm. Carolynne Edouard RN Case Manager [phone] Family member called and expressed her concerns that right along they were expecting pt to go to str. At home he would be left alone for 6hrs/day which would be unsage. She is aware of  PT's recommendation for home with family support.

## 2022-02-13 NOTE — Discharge Instructions
Follow-up with primary care physician in 7 to 10 days Follow-up with cardiology regarding heart murmurContinue with your home medication as prior to being admitted Wean oxygen as tolerated to maintain O2 sat greater than 92% You will need aggressive physical therapy occupational therapy at the nursing homeIf he develop any fever chills cough shortness of breath chest pain palpitation lightheadedness dizziness fainting spells nausea vomiting diarrhea abdominal pain increased urinary frequency bloody urine bloody stool vomiting blood confusion agitation lethargy contact your physician return back to the hospital.

## 2022-02-13 NOTE — Care Coordination-Inpatient
11:18am - Patient will be discharged to [facility] today at 12:00pm via stretcherStretcher Form completed and faxed to M3MD/RN/Son notified10:35am - per MD (Dalipi), not medically cleared for home. Would like patient to be discharged to STR.Writer spoke to son Raymond Gardner. Repisodic List was printed, referrals placed.Patient will be discharged today at 10:30am to homeSon [facility] to [facility] Letter faxed to [facility]

## 2022-02-14 ENCOUNTER — Encounter: Admit: 2022-02-14 | Payer: PRIVATE HEALTH INSURANCE | Primary: Internal Medicine

## 2022-02-18 ENCOUNTER — Encounter: Admit: 2022-02-18 | Payer: MEDICARE | Attending: Internal Medicine | Primary: Internal Medicine

## 2022-02-19 ENCOUNTER — Encounter: Admit: 2022-02-19 | Payer: PRIVATE HEALTH INSURANCE | Primary: Internal Medicine

## 2022-03-06 ENCOUNTER — Telehealth: Admit: 2022-03-06 | Payer: PRIVATE HEALTH INSURANCE | Attending: Internal Medicine | Primary: Internal Medicine

## 2022-03-07 ENCOUNTER — Encounter: Admit: 2022-03-07 | Payer: PRIVATE HEALTH INSURANCE | Attending: Critical Care Medicine | Primary: Internal Medicine

## 2022-03-07 NOTE — Progress Notes
[facility] Care Navigation:  Transition of Care Note Care Navigation spoke with: [facility]: [facility]FLord [facility]lainHospital [facility]: [facility]  SNF Admission Date: 1/3/2024SNF Discharge Date: 1/24/2024Diagnosis: COVID 19 pneumoniaAKIhypomagnesemiaHeart murmurDischarge location: Home with ServicesHOSPITALIZATION:Reason patient admitted: SOBDiagnosis at discharge: COVID 19 pneumoniaAKIhypomagnesemiaHeart murmurCURRENT STATE:Since discharge patient reports feeling: BetterNew symptoms reported include: Raymond Gardner was hospitalized for SOB and was found to have COVID pneumonia, AKI, low magnesium and heart murmur. He is a poor historian per son and unonown whether this is a new or old murmur.After stabilization he was transferred to [facility]. Today his son reports that he is doing fine and is expecting a visit from [facility]NA nurse. Son reports that his dad does manage his meds and takes them all in the morning. His son reports that his dad is forgetful at times and does drink ETOH on occasion. He reports that his dad had heart bypass surgery in 2010 but does not know any other details about his CV status. He states he will try to find the name of his cardiologist in [facility] in order to get medical records.I provided son with [facility] as his dad does not drive any longer.Patient cared for by: Nonefamily lives locally  REVIEW OF AFTER VISIT SUMMARY DOCUMENT:Patient specific questions regarding discharge: Son did not have access to d/c paperworkMEDICATION CHANGES:Validated NEW medications to take: N/AValidated Changed medications to take: N/AValidated Stopped medications to NOT take: N/AIssues obtaining prescriptions: N/AAdditional medication related notes: No med changes notedFOLLOW-UP APPOINTMENTS and TRANSPORTATION:Patient aware of scheduled appointments: YesAwareness and assistance with appointments needing to be scheduled: NoTransportation concerns for follow-up appointment: Needs transportation assistanceson will transport for this appt but will establish account with [employer] and HOME HEALTH SERVICES:Durable medical equipment received: N/AContact has been made with home care agency: Lillette Boxer established for follow up labs/tests: N/A ADDITIONAL PATIENT NEEDS:Identified Social Determinants of Health opportunities: None identified     Transitional Care Management EligibleQualifies for TCM visit to be completed by:  2/7/24Has Transition of Care appointment scheduled on:  03/21/22- this is beyond the 14 day window. Needs to be rescheduled

## 2022-03-12 ENCOUNTER — Encounter: Admit: 2022-03-12 | Payer: PRIVATE HEALTH INSURANCE | Primary: Internal Medicine

## 2022-03-12 ENCOUNTER — Encounter: Admit: 2022-03-12 | Payer: PRIVATE HEALTH INSURANCE | Attending: Internal Medicine | Primary: Internal Medicine

## 2022-03-12 ENCOUNTER — Encounter: Admit: 2022-03-12 | Payer: MEDICARE | Primary: Internal Medicine

## 2022-03-12 MED ORDER — ROSUVASTATIN 20 MG TABLET
20 | ORAL_TABLET | Freq: Every day | ORAL | 2 refills | 90.00000 days | Status: AC
Start: 2022-03-12 — End: 2022-03-12

## 2022-03-12 MED ORDER — MAGNESIUM OXIDE 400 MG (241.3 MG MAGNESIUM) TABLET
400 | ORAL_TABLET | Freq: Every day | ORAL | 4 refills | 90.00000 days | Status: AC
Start: 2022-03-12 — End: 2022-08-23

## 2022-03-12 MED ORDER — ROSUVASTATIN 40 MG TABLET
40 | ORAL_TABLET | Freq: Every day | ORAL | 4 refills | 90.00000 days | Status: AC
Start: 2022-03-12 — End: ?

## 2022-03-14 ENCOUNTER — Encounter: Admit: 2022-03-14 | Payer: PRIVATE HEALTH INSURANCE | Primary: Internal Medicine

## 2022-03-14 ENCOUNTER — Inpatient Hospital Stay: Admit: 2022-03-14 | Discharge: 2022-03-14 | Payer: MEDICARE | Primary: Internal Medicine

## 2022-03-14 DIAGNOSIS — E785 Hyperlipidemia, unspecified: Secondary | ICD-10-CM

## 2022-03-14 DIAGNOSIS — I493 Ventricular premature depolarization: Secondary | ICD-10-CM

## 2022-03-14 DIAGNOSIS — Z951 Presence of aortocoronary bypass graft: Secondary | ICD-10-CM

## 2022-03-14 DIAGNOSIS — I10 Essential (primary) hypertension: Secondary | ICD-10-CM

## 2022-03-14 DIAGNOSIS — I2510 Atherosclerotic heart disease of native coronary artery without angina pectoris: Secondary | ICD-10-CM

## 2022-03-18 ENCOUNTER — Encounter: Admit: 2022-03-18 | Payer: PRIVATE HEALTH INSURANCE | Primary: Internal Medicine

## 2022-03-19 ENCOUNTER — Inpatient Hospital Stay: Admit: 2022-03-19 | Discharge: 2022-03-19 | Payer: MEDICARE | Attending: Emergency Medicine

## 2022-03-19 ENCOUNTER — Encounter
Admit: 2022-03-19 | Payer: PRIVATE HEALTH INSURANCE | Attending: Student in an Organized Health Care Education/Training Program | Primary: Internal Medicine

## 2022-03-19 ENCOUNTER — Telehealth: Admit: 2022-03-19 | Payer: PRIVATE HEALTH INSURANCE | Attending: Internal Medicine | Primary: Internal Medicine

## 2022-03-19 DIAGNOSIS — R1310 Dysphagia, unspecified: Secondary | ICD-10-CM

## 2022-03-19 LAB — BASIC METABOLIC PANEL
BKR ANION GAP: 18 — ABNORMAL HIGH (ref 7–17)
BKR BLOOD UREA NITROGEN: 12 mg/dL (ref 8–23)
BKR BUN / CREAT RATIO: 9 (ref 8.0–23.0)
BKR CALCIUM: 9.2 mg/dL (ref 8.8–10.2)
BKR CHLORIDE: 100 mmol/L (ref 98–107)
BKR CO2: 23 mmol/L (ref 20–30)
BKR CREATININE: 1.34 mg/dL — ABNORMAL HIGH (ref 0.40–1.30)
BKR EGFR, CREATININE (CKD-EPI 2021): 53 mL/min/{1.73_m2} — ABNORMAL LOW (ref >=60)
BKR GLUCOSE: 98 mg/dL (ref 70–100)
BKR POTASSIUM: 4.7 mmol/L (ref 3.3–5.3)
BKR SODIUM: 141 mmol/L (ref 136–144)

## 2022-03-19 LAB — PT/INR AND PTT (BH GH L LMW YH)
BKR INR: 0.99 fL (ref 0.86–1.12)
BKR PARTIAL THROMBOPLASTIN TIME: 26.1 seconds — ABNORMAL HIGH (ref 23.0–31.4)
BKR PROTHROMBIN TIME: 11 seconds (ref 9.6–12.3)

## 2022-03-19 LAB — CBC WITH AUTO DIFFERENTIAL
BKR WAM ABSOLUTE IMMATURE GRANULOCYTES.: 0.04 x 1000/ÂµL (ref 0.00–0.30)
BKR WAM ABSOLUTE LYMPHOCYTE COUNT.: 2.46 x 1000/ÂµL (ref 0.60–3.70)
BKR WAM ABSOLUTE NRBC (2 DEC): 0 x 1000/ÂµL (ref 0.00–1.00)
BKR WAM ANALYZER ANC: 6.58 x 1000/ÂµL (ref 2.00–7.60)
BKR WAM BASOPHIL ABSOLUTE COUNT.: 0.07 x 1000/ÂµL (ref 0.00–1.00)
BKR WAM BASOPHILS: 0.7 % (ref 0.0–1.4)
BKR WAM EOSINOPHIL ABSOLUTE COUNT.: 0.05 x 1000/ÂµL (ref 0.00–1.00)
BKR WAM EOSINOPHILS: 0.5 % (ref 0.0–5.0)
BKR WAM HEMATOCRIT (2 DEC): 44.4 % (ref 38.50–50.00)
BKR WAM HEMOGLOBIN: 16 g/dL — ABNORMAL HIGH (ref 13.2–17.1)
BKR WAM IMMATURE GRANULOCYTES: 0.4 % (ref 0.0–1.0)
BKR WAM LYMPHOCYTES: 23.7 % (ref 17.0–50.0)
BKR WAM MCH (PG): 35.3 pg — ABNORMAL HIGH (ref 27.0–33.0)
BKR WAM MCHC: 36 g/dL (ref 31.0–36.0)
BKR WAM MCV: 98 fL (ref 80.0–100.0)
BKR WAM MONOCYTE ABSOLUTE COUNT.: 1.16 x 1000/ÂµL — ABNORMAL HIGH (ref 0.00–1.00)
BKR WAM MONOCYTES: 11.2 % (ref 4.0–12.0)
BKR WAM MPV: 11.9 fL (ref 8.0–12.0)
BKR WAM NEUTROPHILS: 63.5 % (ref 39.0–72.0)
BKR WAM NUCLEATED RED BLOOD CELLS: 0 % (ref 0.0–1.0)
BKR WAM PLATELETS: 144 x1000/ÂµL — ABNORMAL LOW (ref 150–420)
BKR WAM RDW-CV: 12.8 % (ref 11.0–15.0)
BKR WAM RED BLOOD CELL COUNT.: 4.53 M/ÂµL (ref 4.00–6.00)
BKR WAM WHITE BLOOD CELL COUNT: 10.4 x1000/ÂµL (ref 4.0–11.0)

## 2022-03-19 NOTE — ED Notes
3:10 PM Pt arrives to AED via walk in with complaints of difficulty swallowing. Pt reporting that this morning he was able to swallow his pills but since then he feels like they have not gone down. +repetitive coughing, however, speaking in full clear sentences and managing all secretions. RR WNL. O2 sat 94% on RA, hx of COPD and O2 sat normally 91-94% at baseline.

## 2022-03-19 NOTE — ED Notes
4:44 PM Pt walked in to the ED for eval of difficulty swallowing, became medical pt for eval of foreign body swallowing. Pt able to clear oral secretions, airway patent, denies resp distress. Pt states that he has been having multiple episodes of difficulty swallowing at home, states that it just gets stuck in the middle here. Pt states that today, he was having more difficulty w/ his pills and was unable to swallow water after his pills. GI provider at bedside to discuss plan of care.5:46 PM Discharge instructions given by provider, handout given, PIV access removed. Pt walked out of ED w/ steady gait.

## 2022-03-19 NOTE — Discharge Instructions
You were evaluated in the emergency room for difficulty swallowing. Your evaluation did not show signs of a condition requiring further evaluation in the hospital today.  However you should follow up with the GI doctor outpatient for further evaluation.  They will call you to arrange an appointment.  Additionally, please follow up with your primary care provider for further evaluation and management. Return to the emergency department with any difficulty swallowing your saliva or liquids, new or worsening pain in your chest, cough, fevers, difficulty breathing, or any other symptoms that are concerning to you.

## 2022-03-20 ENCOUNTER — Encounter: Admit: 2022-03-20 | Payer: PRIVATE HEALTH INSURANCE | Primary: Internal Medicine

## 2022-03-20 NOTE — ED Provider Notes
Chief Complaint Patient presents with ? Medical Problem -----------------------------------------------------------------------Resident Note:HPI:Pt is a 82 y.o. male w/ PMH of coronary artery disease, GERD, diaphragmatic hernia, hypertension, reported history of chronic dysphagia presenting with difficulty swallowing.  He has a long history, several years, of worsening difficulty swallowing but is usually able to swallow solid foods.  This morning he was trying to swallow his normal pills when he had sudden pain, was unable to follow, had pain in his upper chest, and since then has been unable to swallow liquids.  He feels when he tries to swallow his secretions it also comes back up, on arrival he was coughing severely and made a medical patient to R3.BP (!) 115/98  - Pulse (!) 91  - Temp 97.9 ?F (36.6 ?C) (Temporal)  - Resp 18  - SpO2 94% PE: Gen:  Awake, sitting up in bed, speaking in full sentences, no acute distressHENT: NC/ATCV: regular rate and rhythm, no murmur notedPulm: normal respiratory effort on room air, no tachypnea, breath sounds present in all fields without rales or wheezesAbd: soft, non-distended, non-tender, no rebound tenderness or guardingExt: warm, well-perfused, 2+ pulses in distal extremitiesNeuro: no focal deficits Medical Decision Making: Given the presentation and exam findings above, I considered food impaction, ACS, dysphagia and will plan for basic labs, EKG.  Patient states he is unable to swallow his secretions, will consult GI for food impactionED Course:- no acute abnormalities on labs, no acute ischemic changes on EKG- patient seen by GI, now swallowing fluids, GI states he was not have an impaction and can follow up with them in clinic this weekDisposition:  DischargeI considered Admission/Observation however at this time it is not indicated as the patient is clinically appropriate for discharge. Return precautions were extensively discussed and the patient was instructed to follow-up with appropriate outpatient providers. This patient was presented to and discussed with an emergency medicine attending physician and a treatment plan and disposition were collaboratively agreed upon.Ladona Ridgel, MDEmergency Medicine ResidentAvailable on MHB2/08/2022 This note may have been generated using M*Modal voice dictation software, please excuse any typographical errors due to flaws in voice recognition.--------------------------------------------------------------------------------------------------------------------------------MDM  Physical ExamED Triage Vitals [03/19/22 1513]BP: (!) 115/98Pulse: (!) 91Pulse from  O2 sat: n/aResp: 18Temp: 97.9 ?F (36.6 ?C)Temp src: TemporalSpO2: 94 % BP (!) 115/98  - Pulse (!) 91  - Temp 97.9 ?F (36.6 ?C) (Temporal)  - Resp 18  - SpO2 94% Physical Exam ProceduresAttestation/Critical CarePatient Reevaluation: Attending Supervised: ResidentI saw and examined the patient. I agree with the findings and plan of care as documented in the resident's note. Patient here with sensation of foreign body stuck in his throat, states he is unable to manage his secretions, here he does not seem to be having any evidence of airway compromise but given he is symptomatic will check labs, x-ray and discuss with GI.  He is well-appearing, no respiratory distress, belly soft.  Brought in as a medical patient given concerns for airway compromise from triage however at this time no acute concern for this.Lorene Samaan@@@@@@@@@@@@@@@@@@@@@@@@@Turned  over to me stable pending GI evaluation.Pauline Aus, MDCritical care provided by attending: critical careCritical care time (minutes): 31.Critical care time was exclusive of separately billable procedures and teaching time.Critical care was necessary to treat or prevent imminent or life-threatening deterioration of the following conditions:  Respiratory failure.Critical care was time spent personally by me on the following activities: development of treatment plan with patient or surrogate, discussions with consultants, evaluation of patient's response to treatment, examination of patient, ordering and performing treatments  and interventions, obtaining history from patient or surrogate, ordering and review of laboratory studies, ordering and review of radiographic studies, pulse oximetry and re-evaluation of patient's condition.Comments as of 03/20/22 0757 Tue Mar 19, 2022 1717 Hx of HTN, DMWaiting on GI for esophageal food impaction, long Hx of difficulty swallowing, have sensation of medication stuck in throat[ ]  GI rec and dc [SA]  Comments User Index[SA] Arlys John, MD   Clinical Impressions as of 03/20/22 0757 Dysphagia, unspecified type  ED DispositionDischarge Della-GiustinaOnalee Hua, MD02/06/24 1723 Ladona Ridgel, MDResident02/06/24 1749 Arther Dames, MD02/07/24 [phone]

## 2022-03-20 NOTE — Consults
[facility] Gastroenterology Consult NoteReason for Consult: Food impactionEvaluation requested by: Attending Provider: Pauline Aus, MD [id number] History: Raymond Gardner is a 82 y.o. 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  He reports food getting stuck in his chest.  It usually takes a few minutes for food to pass through.  He denies any trouble initiating swallowing or pain with swallowing.  He does report occasional regurgitation of food back into his mouth.  He has longstanding history of GERD and takes omeprazole once a day.  He denies any melena, denies coffee-ground emesis, denies weight loss.  He has never had an EGD for food impaction, and does not know if he has had an EGD ever. He had a multiple pack year smoking history but quit since 1987. Denies family history of GI malignancy.He reports that he had trouble getting his pill down this morning, with some respiratory distress and trouble managing secretions for which he came to the ED. However, he felt the pill pass through while in the ED. He was able to tolerate sips of water on my evaluation. Review of Systems: A 10 point review of systems was performed and is negative except pertinent findings in HPI.Review of Medical/Family/Surgical/Social History/Medications: No past medical history on file. Family History Problem Relation Age of Onset ? COPD Brother  ? Coronary Artery Disease Brother   Past Surgical History: Procedure Laterality Date ? CORONARY ARTERY BYPASS GRAFT  2010  Social History Tobacco Use ? Smoking status: Former   Current packs/day: 0.00   Types: Cigarettes   Quit date: 1987   Years since quitting: 37.1 ? Smokeless tobacco: Never Vaping Use ? Vaping Use: Never used Substance Use Topics ? Alcohol use: Yes   Comment: 7 glasses of wine per week, 1 per day ? Drug use: Never  Outpatient Medications:No current facility-administered medications on file prior to encounter. Current Outpatient Medications on File Prior to Encounter Medication Sig Dispense Refill ? aspirin 81 mg EC delayed release tablet every 24 hours.   ? ezetimibe (ZETIA) 10 mg tablet Take 1 tablet (10 mg total) by mouth daily. 90 tablet 0 ? magnesium oxide (MAG-OX) 400 mg (241.3 mg magnesium) tablet Take 1 tablet (400 mg total) by mouth daily. 90 tablet 3 ? montelukast (SINGULAIR) 10 mg tablet TAKE 1 TABLET(10 MG) BY MOUTH EVERY NIGHT 30 tablet 0 ? omeprazole (PRILOSEC) 20 mg capsule TAKE 1 CAPSULE(20 MG) BY MOUTH DAILY 30 capsule 6 ? rosuvastatin (CRESTOR) 40 mg tablet Take 1 tablet (40 mg total) by mouth daily. 90 tablet 3 ? sertraline (ZOLOFT) 50 mg tablet sertraline 50 mg tablet TAKE 1 TABLET BY MOUTH ONCE DAILY   ? tiotropium-olodateroL (STIOLTO RESPIMAT) 2.5-2.5 mcg/actuation mist for inhalation Inhale 2 Inhalation into the lungs daily. 4 g 11 ? valsartan (DIOVAN) 80 mg tablet TAKE 1 TABLET(80 MG) BY MOUTH DAILY 30 tablet 0 Objective: Vitals:Temp:  [97.9 ?F (36.6 ?C)] 97.9 ?F (36.6 ?C)Pulse:  [91] 91Resp:  [18] 18BP: (115)/(98) 115/98SpO2:  [94 %] 94 %Device (Oxygen Therapy): room airPhysical Exam:GENERAL: Awake, cooperative, in no acute distress. Well nourished.EYES: No scleral icterus, no conjunctival pallorENT: Moist mucous membranes, no oropharyngeal lesions, neck symmetric and supple, able to manage secretionsRESPIRATORY: No respiratory distress on room  airGASTROINTESTINAL: Soft, nondistended, nontender without guarding or reboundNEURO: Alert and Oriented x 3, no focal deficitsPSYCH: Normal mood and affect Medications:SCHEDULED:No current facility-administered medications for this encounter. ZOX:WRUE:AVWUJW Labs Lab 02/06/241513 WBC 10.4 HGB 16.0 HCT 44.40 PLT 144*  Recent Labs Lab 02/06/241513 NEUTROPHILS 63.5  Recent Labs Lab 02/06/241513 NA 141 K 4.7 CL 100 CO2 23 BUN 12 CREATININE 1.34* GLU 98 ANIONGAP 18*  Recent Labs Lab 02/06/241513 CALCIUM 9.2  No results for input(s): ALT, AST, ALKPHOS, BILITOT, BILIDIR, PROT, ALBUMIN in the last 168 hours. Recent Labs Lab 02/06/241514 PTT 26.1 LABPROT 11.0 INR 0.99  No results found for: IRON, TIBC, FERRITIN Imaging:No evidence of pulmonary embolism or acute thoracic pathology.Large hiatal hernia with intrathoracic stomach and containing nonobstructed portions of the colon.Procedural:NoneImpression & Recommendations: Ranardo Mutch is a 82 y.o. 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 His symptoms are concerning for esophageal dysphagia and his large hiatal hernia and GERD put him at risk for peptic stricture and malignancy. Other causes of esophageal dysphagia include structural causes like webs, rings, or motility issues like achalasia. He warrants an upper endoscopy for evaluation. We discussed this with the patient and he expressed strong preference to go home and do this as an outpatient. - Will plan for expedited outpatient EGD- Recommend mechanical soft diet till EGD- Continue omeprazole daily. If has trouble swallowing the pill, can consider lansoprazole suspensionCase discussed with Dr. Elnoria Howard, final attending attestation to follow. Please do not hesitate to contact our team with any questions or concerns.Patient's Primary Gastroenterologist: Catalina Gravel, MDYale Digestive DiseasesBest Contact: Mobile [facility] [phone] after 5pm and weekends, please contact on-call GI fellow as listed on Amion

## 2022-03-21 ENCOUNTER — Encounter: Admit: 2022-03-21 | Payer: MEDICARE | Attending: Family | Primary: Internal Medicine

## 2022-03-21 ENCOUNTER — Ambulatory Visit: Admit: 2022-03-21 | Payer: MEDICARE | Attending: Internal Medicine | Primary: Internal Medicine

## 2022-03-21 ENCOUNTER — Encounter: Admit: 2022-03-21 | Payer: PRIVATE HEALTH INSURANCE | Attending: Internal Medicine | Primary: Internal Medicine

## 2022-03-21 NOTE — Progress Notes
[facility] Care Navigation: Emergency Room DischargeEmergency Room:  Date of ER Arrival:  Call placed to: PatientThe patients states ED care was sought for Dysphasia Patient was in his PCP office waiting for appt when second call was made. Informed him to please call call center with any questions after appt. .The patient states they are feeling  .Review of the Discharge Instructions:      Follow-Up Plan: Discussed appointments

## 2022-03-21 NOTE — Other
ReviewReview NP: Otilio Saber

## 2022-03-22 ENCOUNTER — Inpatient Hospital Stay: Admit: 2022-03-22 | Discharge: 2022-03-22 | Payer: MEDICARE | Primary: Internal Medicine

## 2022-03-22 DIAGNOSIS — Z951 Presence of aortocoronary bypass graft: Secondary | ICD-10-CM

## 2022-03-22 DIAGNOSIS — I2510 Atherosclerotic heart disease of native coronary artery without angina pectoris: Secondary | ICD-10-CM

## 2022-03-22 DIAGNOSIS — I493 Ventricular premature depolarization: Secondary | ICD-10-CM

## 2022-03-22 DIAGNOSIS — I10 Essential (primary) hypertension: Secondary | ICD-10-CM

## 2022-03-22 DIAGNOSIS — E785 Hyperlipidemia, unspecified: Secondary | ICD-10-CM

## 2022-03-25 ENCOUNTER — Telehealth: Admit: 2022-03-25 | Payer: PRIVATE HEALTH INSURANCE | Primary: Internal Medicine

## 2022-03-25 NOTE — Telephone Encounter
TC-I spoke with Dr Kate Sable medical staff today, they will send MD a message to clarify if patient may safely proceed with EGD with Dr Jeannene Patella 03/27/2022 with Holter Monitor in place. Read back verbalizes understanding, will call me back at number below with update.Please call with any Cameron Sprang, MSN, RN, [facility] CoordinatorAdvanced Endoscopy & Pancreatic DiseasesPhone- [phone]

## 2022-03-26 ENCOUNTER — Encounter: Admit: 2022-03-26 | Payer: PRIVATE HEALTH INSURANCE | Attending: Gastroenterology | Primary: Internal Medicine

## 2022-03-26 DIAGNOSIS — I493 Ventricular premature depolarization: Secondary | ICD-10-CM

## 2022-03-26 DIAGNOSIS — I10 Essential (primary) hypertension: Secondary | ICD-10-CM

## 2022-03-26 DIAGNOSIS — U071 COVID-19: Secondary | ICD-10-CM

## 2022-03-26 DIAGNOSIS — R1310 Dysphagia, unspecified: Secondary | ICD-10-CM

## 2022-03-26 DIAGNOSIS — Z951 Presence of aortocoronary bypass graft: Secondary | ICD-10-CM

## 2022-03-26 DIAGNOSIS — E785 Hyperlipidemia, unspecified: Secondary | ICD-10-CM

## 2022-03-26 DIAGNOSIS — J439 Emphysema, unspecified: Secondary | ICD-10-CM

## 2022-03-26 DIAGNOSIS — I2510 Atherosclerotic heart disease of native coronary artery without angina pectoris: Secondary | ICD-10-CM

## 2022-03-26 NOTE — Telephone Encounter
Final attempt made to conduct pre-procedure call for EGD on 03/27/2022 at [facility]. No patient contact made Arsen Lanes, son. Left final voice message to return phone call to the GI Navigation Department. Chart sent for NP review.

## 2022-03-27 ENCOUNTER — Ambulatory Visit: Admit: 2022-03-27 | Payer: MEDICARE | Attending: Family | Primary: Internal Medicine

## 2022-03-27 ENCOUNTER — Inpatient Hospital Stay: Admit: 2022-03-27 | Discharge: 2022-03-27 | Payer: MEDICARE | Attending: Gastroenterology | Primary: Internal Medicine

## 2022-03-27 ENCOUNTER — Encounter: Admit: 2022-03-27 | Payer: PRIVATE HEALTH INSURANCE | Attending: Gastroenterology | Primary: Internal Medicine

## 2022-03-27 DIAGNOSIS — J439 Emphysema, unspecified: Secondary | ICD-10-CM

## 2022-03-27 DIAGNOSIS — E039 Hypothyroidism, unspecified: Secondary | ICD-10-CM

## 2022-03-27 DIAGNOSIS — Z951 Presence of aortocoronary bypass graft: Secondary | ICD-10-CM

## 2022-03-27 DIAGNOSIS — K219 Gastro-esophageal reflux disease without esophagitis: Secondary | ICD-10-CM

## 2022-03-27 DIAGNOSIS — J449 Chronic obstructive pulmonary disease, unspecified: Secondary | ICD-10-CM

## 2022-03-27 DIAGNOSIS — I2510 Atherosclerotic heart disease of native coronary artery without angina pectoris: Secondary | ICD-10-CM

## 2022-03-27 DIAGNOSIS — G709 Myoneural disorder, unspecified: Secondary | ICD-10-CM

## 2022-03-27 DIAGNOSIS — K2270 Barrett's esophagus without dysplasia: Secondary | ICD-10-CM

## 2022-03-27 DIAGNOSIS — I10 Essential (primary) hypertension: Secondary | ICD-10-CM

## 2022-03-27 DIAGNOSIS — K3189 Other diseases of stomach and duodenum: Secondary | ICD-10-CM

## 2022-03-27 DIAGNOSIS — E785 Hyperlipidemia, unspecified: Secondary | ICD-10-CM

## 2022-03-27 DIAGNOSIS — U071 COVID-19: Secondary | ICD-10-CM

## 2022-03-27 DIAGNOSIS — I493 Ventricular premature depolarization: Secondary | ICD-10-CM

## 2022-03-27 DIAGNOSIS — R1310 Dysphagia, unspecified: Secondary | ICD-10-CM

## 2022-03-27 DIAGNOSIS — I350 Nonrheumatic aortic (valve) stenosis: Secondary | ICD-10-CM

## 2022-03-27 DIAGNOSIS — Z8616 Personal history of COVID-19: Secondary | ICD-10-CM

## 2022-03-27 DIAGNOSIS — K222 Esophageal obstruction: Secondary | ICD-10-CM

## 2022-03-27 DIAGNOSIS — K449 Diaphragmatic hernia without obstruction or gangrene: Secondary | ICD-10-CM

## 2022-03-27 DIAGNOSIS — Z87891 Personal history of nicotine dependence: Secondary | ICD-10-CM

## 2022-03-27 DIAGNOSIS — Z79899 Other long term (current) drug therapy: Secondary | ICD-10-CM

## 2022-03-27 DIAGNOSIS — Z7982 Long term (current) use of aspirin: Secondary | ICD-10-CM

## 2022-03-27 MED ORDER — ONDANSETRON HCL (PF) 4 MG/2 ML INJECTION SOLUTION
42 mg/2 mL | INTRAVENOUS | Status: DC | PRN
Start: 2022-03-27 — End: 2022-03-27
  Administered 2022-03-27: 15:00:00 4 mg/2 mL via INTRAVENOUS

## 2022-03-27 MED ORDER — OMEPRAZOLE 20 MG CAPSULE,DELAYED RELEASE
20 mg | ORAL_CAPSULE | Freq: Two times a day (BID) | ORAL | 4 refills | Status: AC
Start: 2022-03-27 — End: ?

## 2022-03-27 MED ORDER — EPHEDRINE 25 MG/5 ML IN 0.9% SODIUM CHLORIDE (WRAPPED ERX)
2555 mg/5 mL (5 mg/mL) | INTRAVENOUS | Status: DC | PRN
Start: 2022-03-27 — End: 2022-03-27
  Administered 2022-03-27: 16:00:00 25 mg/5 mL (5 mg/mL) via INTRAVENOUS

## 2022-03-27 MED ORDER — LIDOCAINE (PF) 20 MG/ML (2 %) INJECTION SOLUTION
202 mg/mL (2 %) | INTRAVENOUS | Status: DC | PRN
Start: 2022-03-27 — End: 2022-03-27
  Administered 2022-03-27: 15:00:00 20 mg/mL (2 %) via INTRAVENOUS

## 2022-03-27 MED ORDER — FENTANYL (PF) 50 MCG/ML INJECTION SOLUTION
50 mcg/mL | Status: CP
Start: 2022-03-27 — End: ?

## 2022-03-27 MED ORDER — PHENYLEPHRINE 1 MG/10 ML (100 MCG/ML) IN 0.9 % SOD.CHLORIDE IV SYRINGE
110100 mg/0 mL (00 mcg/mL) | INTRAVENOUS | Status: DC | PRN
Start: 2022-03-27 — End: 2022-03-27
  Administered 2022-03-27: 16:00:00 1 mg/0 mL (00 mcg/mL) via INTRAVENOUS

## 2022-03-27 MED ORDER — LACTATED RINGERS INTRAVENOUS SOLUTION
INTRAVENOUS | Status: DC | PRN
Start: 2022-03-27 — End: 2022-03-27
  Administered 2022-03-27: 15:00:00 via INTRAVENOUS

## 2022-03-27 MED ORDER — PROPOFOL 10 MG/ML INTRAVENOUS EMULSION
10 mg/mL | Status: DC | PRN
Start: 2022-03-27 — End: 2022-03-27
  Administered 2022-03-27: 15:00:00 10 mL/h

## 2022-03-27 MED ORDER — FENTANYL (PF) 50 MCG/ML INJECTION SOLUTION
50 mcg/mL | INTRAVENOUS | Status: DC | PRN
Start: 2022-03-27 — End: 2022-03-27
  Administered 2022-03-27 (×2): 50 mcg/mL via INTRAVENOUS

## 2022-03-27 MED ORDER — PROPOFOL 10 MG/ML INTRAVENOUS EMULSION
10 mg/mL | INTRAVENOUS | Status: DC | PRN
Start: 2022-03-27 — End: 2022-03-27
  Administered 2022-03-27 (×3): 10 mg/mL via INTRAVENOUS

## 2022-03-27 NOTE — Other
[facility]		Gastroenterology History & PhysicalHistory provided by: The patientSubjective: Procedure: EGDHPI 68 M PMHx CAD, GERD, HTN, diaphragmatic hernia and chronic dysphagia presents for EGD for worsening dysphagia.Medical History: PMH PSH Past Medical History: Diagnosis Date ? Coronary artery disease  ? COVID-19 02/08/2022 ? Dysphagia  ? Essential hypertension  ? Hx of CABG  ? Hyperlipidemia  ? Premature ventricular contractions  ? Pulmonary emphysema (HC Code)   Past Surgical History: Procedure Laterality Date ? CORONARY ARTERY BYPASS GRAFT  2010  Social History Family History Social History Tobacco Use ? Smoking status: Former   Current packs/day: 0.00   Types: Cigarettes   Quit date: 1987   Years since quitting: 37.1 ? Smokeless tobacco: Never Substance Use Topics ? Alcohol use: Yes   Comment: 7 glasses of wine per week, 1 per day  Family History Problem Relation Age of Onset ? COPD Brother  ? Coronary Artery Disease Brother   Prior to Admission Medications Medications Prior to Admission Medication Sig Dispense Refill Last Dose ? aspirin 81 mg EC delayed release tablet every 24 hours.    ? ezetimibe (ZETIA) 10 mg tablet Take 1 tablet (10 mg total) by mouth daily. 90 tablet 0  ? magnesium oxide (MAG-OX) 400 mg (241.3 mg magnesium) tablet Take 1 tablet (400 mg total) by mouth daily. 90 tablet 3  ? montelukast (SINGULAIR) 10 mg tablet TAKE 1 TABLET(10 MG) BY MOUTH EVERY NIGHT 30 tablet 0  ? omeprazole (PRILOSEC) 20 mg capsule TAKE 1 CAPSULE(20 MG) BY MOUTH DAILY 30 capsule 6  ? rosuvastatin (CRESTOR) 40 mg tablet Take 1 tablet (40 mg total) by mouth daily. 90 tablet 3  ? sertraline (ZOLOFT) 50 mg tablet sertraline 50 mg tablet TAKE 1 TABLET BY MOUTH ONCE DAILY    ? tiotropium-olodateroL (STIOLTO RESPIMAT) 2.5-2.5 mcg/actuation mist for inhalation Inhale 2 Inhalation into the lungs daily. 4 g 11  ? valsartan (DIOVAN) 80 mg tablet TAKE 1 TABLET(80 MG) BY MOUTH DAILY 30 tablet 0   Allergies No Known Allergies Review of Systems: Review of Systems All other systems reviewed and are negative.Objective: Vitals:Vitals:  03/27/22 0930 BP: (!) 169/83 Pulse: 85 Resp: 20 Temp: 97.6 ?F (36.4 ?C) TempSrc: Oral SpO2: 95% Weight: 81.1 kg Physical Exam: Physical ExamConstitutional:     General: He is not in acute distress.   Appearance: He is well-developed. Eyes:    Pupils: Pupils are equal, round, and reactive to light. Cardiovascular:    Rate and Rhythm: Normal rate and regular rhythm.    Heart sounds: No murmur heard.Pulmonary:    Effort: Pulmonary effort is normal.    Breath sounds: Normal breath sounds. No wheezing. Abdominal:    Palpations: Abdomen is soft. Assessment and Plan: 75 M PMHx CAD, GERD, HTN, diaphragmatic hernia and chronic dysphagia presents for EGD for worsening dysphagia.-Risks and benefits discussed. Proceed with EGD with possible interventionsPCP: Elease Etienne [phone]:Joniece Smotherman Doyne Keel, MD 03/27/2022

## 2022-03-27 NOTE — Other
Operative Diagnosis:Pre-op:    DysphagiaPatient Coded Diagnosis   Pre-op diagnosis: Dysphagia, unspecified type  Post-op diagnosis: Dysphagia, unspecified type  Patient Diagnosis   None    Post-op diagnosis:   * Dysphagia, unspecified type [R13.10]Operative Procedure(s) :Procedure(s) (LRB):EGD (N/A)Post-op Procedure & Diagnosis ConfirmationPost-op Diagnosis: Post-op Diagnosis updated (see notes)     - DysphagiaPost-op Procedure: Post-op Procedure updated (see notes)     - EGD

## 2022-03-27 NOTE — Anesthesia Post-Procedure Evaluation
Anesthesia Post-op NotePatient: Raymond FlemingProcedure(s):  Procedure(s) (LRB):EGD (N/A) Last Vitals:  I have reviewed the post-operative vital signs as noted in the Epic chart.POSTOP EVALUATION:      Patient Recovery Location:  PACU     Vital Signs Status:  Stable     Patient Participation:  Patient participated     Mental Status:  Awake     Respiratory Status:  Acceptable     Airway Patency:  Patent     Cardiovascular/Hydration Status:  Stable     Pain Management:  Satisfactory to patient     Nausea/Vomiting Status:  Satisfactory to patientThere were no known notable events for this encounter.

## 2022-03-27 NOTE — Other
Pt brought into room 2. Time out performed. Pt positioned left lateral on stretcher. Bite block placed. Sedated and monitored per anesthesia. Pt underwent EGD with Dr. Jeannene Patella. Proximal esophagus BXs taken with forceps. Switched to smaller scope. Distal esophagus BXs taken with small forceps.  Pt tolerated procedure well. Transferred to PACU. Report given to PACU RN.

## 2022-03-27 NOTE — Plan of Care
Pt transported to PACU via stretcher with CRNA and RN. Pt HD stable, denies SOB/dizziness/CV complaints/pain at this time. Discharge instructions reviewed with pt at bedside, pt verbalizing understanding. All safety maintained. Pt assisted off unit at time of discharge by unit staff via wheelchair for safety.

## 2022-03-27 NOTE — Anesthesia Pre-Procedure Evaluation
This is a 82 y.o. male scheduled for EGD.Review of Systems/ Medical HistoryPatient summary, nursing notes, pre-procedure vitals, height, weight and NPO status reviewed.Anesthesia Evaluation: Estimated body mass index is 24.94 kg/m? as calculated from the following:  Height as of 03/21/22: 5' 11 (1.803 m).  Weight as of this encounter: 81.1 kg. CC/HPI: CHART PRE POPULATED BASED ON MEDICAL RECORD. INCOMPLETE REVIEW OF SYSTEMS. PATIENT NOT YET EVALUATED 82 y.o. male with a large hiatal hernia, coronary artery disease status post coronary artery bypass graft, mild aortic stenosis (03/14/2022 echocardiogram) who presented to ED 03/19/22 with acute  intermittent dysphagia over the last few months, worse with solids than liquids. Scheduled for EGDPast Surgical History:  CORONARY ARTERY BYPASS GRAFTCardiovascular:Patient has a history of: hypertension. -Exercise tolerance: >4 METS -Coronary Artery Disease: CAD and CABG (CABG in 2010) -Valvular disease: history of valvular problems/murmurs aortic stenosis -Vascular Disease:  carotid artery disease.   -Other Cardiovascular:  Echo 03/14/22~ * Normal left ventricular size, wall thickness, systolic function and wall motion. LVEF calculated by biplane Simpson's was 58%.  Normal diastolic function and filling pressures.~ * Normal right ventricular cavity size and systolic function.~ * Moderate aortic valve calcification.  Mild aortic stenosis.  Peak aortic velocity is 2.5 m/s with a calculated peak aortic gradient of 26 mmHg.  Mean gradient is 13 mmHg.  Aortic valve area is 1.4 cm2.  Dimensionless index is 0.44.  Mild aortic regurgitation.~ * Normal mitral valve leaflets.  Mild posterior mitral annular calcification.  Trace mitral regurgitation.~ * Mild tricuspid regurgitation with normal right heart pressures.~ * No significant pericardial effusion.~ * No prior study available for comparison.Cardiology note 1/30/24Cardiology message 03/25/22 There is no problem doing an endoscopy while the patient has a Holter monitor on.  It was ordered as a 3 day monitor, so it should be finished by 2/14 anyway. Marland Kitchen Respiratory:      -Nicotine Dependence: Nicotine dependence: former smoker.-Lung Disorders:      -COPD:  yes-Comments: [place] chest 12/28/23Impression No evidence of pulmonary embolism or acute thoracic pathology.Large hiatal hernia with intrathoracic stomach and containing nonobstructed portions of the colon.[facility] Notify System Classification: Routine.Neuromuscular: -Muscle disorders: neuromuscular disease Gastrointestinal/Genitourinary: -Gastrointestinal Disorders:  Patient has GERD.Hematological/Lymphatic: -Coagulopathy:  He has thrombocytopenia.Endocrine/Metabolic: -Thyroid Disorders:  Patient has hypothyroidism.Behavioral/Social/Psychiatric & Syndromes:  Patient has depression.Physical ExamCardiovascular:    normal exam  Pulmonary:  normal exam  Airway:  Mallampati: IITM distance: >3 FBNeck ROM: fullMouth Opening: >3cmDental:  Dentition: dentures upper and dentures lowerAnesthesia PlanASA 3 The primary anesthesia plan is  general. TIVA with natural AirwayRisks and benefits of anesthesia explained to patient. Patient agrees to proceed.Plan discussed with CRNA providerPerioperative Code Status confirmed: It is my understanding that the patient is currently designated as 'Full Code' and will remain so throughout the perioperative period.Anesthesia informed consent obtained. Anesthesia written consent obtainedConsent obtained from: patientUse of blood products: consented    Consent Comment: Plan discussed with CRNA providerPlan discussed with Attending and CRNA.Anesthesiologist's Pre Op NoteI personally evaluated and examined the patient prior to the intra-operative phase of care on the day of the procedure.Marland Kitchen

## 2022-03-27 NOTE — Discharge Instructions
Endoscopy After Care In case of any problems after your procedure, please call [phone] or go to the nearest Emergency Room for any urgent or severe symptoms, including pain, fever, shortness of breath.. 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   Barrett's esophagus is a change in the lining of the esophagus due to chronic acid reflux.  Biopsies performed. Please call for biopsy results within 5-7 days [phone] see copy of procedure report.Check a barium esophagram xray to further evaluate the swallowing.   CALL [phone] to schedule the barium esophagram xray.  Increase Prilosec to 20 mg twice a day ( morning and evening).  Medication ordered and will be mailed to you.  Take liquid diet today and then pureed diet with soft solids as tolerated beginning tomorrow if feeling well.   Will schedule telehealth follow up to review the results and a subsequent EGD to perform dilation of the esophageal stricture. HOME CARE INSTRUCTIONSActivityYou may resume your regular activity but take frequent rest periods for the next 24 hours.You may experience abdominal discomfort such as a feeling of fullness or gas pains.Walking will help expel (get rid of) the air and reduce the bloated feeling in your abdomen.You may experience a sore throat for 2 to 3 days (if you had an upper endoscopy). This is normal. Gargling with salt water may help this.You may shower.A companion MUST drive you home, as the sedation impairs your reflexes and judgments.For the remainder of the day, you should not operate any vehicle or heavy machinery or make important decisions due to the sedation.  We recommend resting quietly.NutritionDrink plenty of fluids.Begin with a light meal and progress to your normal diet as tolerated.Avoid alcoholic beverages for 24 hours or as instructed by your caregiver.MedicationsYou may resume your normal medications unless your caregiver tells you otherwise.Follow-up* Please check out with the front desk [phone] before leaving to see if and when your follow up should be scheduled. You will be called within 5-7 business days to schedule a telehealth appointment with our clinicians to review the biopsy/cytology results of your procedure.  If you do not hear from anyone or if you are concerned, please call  [phone].SEEK IMMEDIATE MEDICAL CARE IF:You have excessive nausea (feeling sick to your stomach) and/or vomiting.You have severe abdominal pain and distention (swelling).You have trouble swallowing.You have a temperature over 100? F (37.8? C).You have rectal bleeding, black tarry stools or vomiting of blood.You are unable to urinate or pass bowel movements.Drs. Mauro Kaufmann, Kerby Less, [facility] & Rosanne Gutting want you to have the best procedure experience possible.Please call with any questions, concerns or problems.M-F 8:30 am-4:30 pm at [phone]________________________________________________________________________July 2017

## 2022-03-27 NOTE — Other
Post Anesthesia Transfer of Care NotePatient: Raymond FlemingProcedure(s) Performed: Procedure(s) (LRB):EGD (N/A)Last Vitals: I have reviewed the post-operative vital signs during the handoff as noted in the Epic chart.POSTOP HANDOFF :      Patient Location:  PACU     Level of Consciousness:  Sedated and responds to stimulation     VS stable since last recorded intra-op set? Yes       Oxygen source: maskPatient co-morbidities, intra-operative course, intake & output and antibiotics as per Anesthesia record were discussed with the RN.

## 2022-03-28 ENCOUNTER — Telehealth: Admit: 2022-03-28 | Payer: PRIVATE HEALTH INSURANCE | Attending: Gastroenterology | Primary: Internal Medicine

## 2022-03-28 ENCOUNTER — Encounter: Admit: 2022-03-28 | Payer: PRIVATE HEALTH INSURANCE | Primary: Internal Medicine

## 2022-03-28 DIAGNOSIS — K449 Diaphragmatic hernia without obstruction or gangrene: Secondary | ICD-10-CM

## 2022-03-28 DIAGNOSIS — R1310 Dysphagia, unspecified: Secondary | ICD-10-CM

## 2022-03-28 NOTE — Telephone Encounter
Called pt's son and LVM1- Pt need to call and schedule Esophagram [phone] Esophagram pt needs to call us back [place] to schedule Tele health with Dr Jeannene Patella and also a procedure.

## 2022-04-04 ENCOUNTER — Telehealth: Admit: 2022-04-04 | Payer: PRIVATE HEALTH INSURANCE | Attending: Family | Primary: Internal Medicine

## 2022-04-04 NOTE — Telephone Encounter
Called pt, talk to his son to schedule post procedure with APP.Pt's son will call back. I send him link to his e-mail.

## 2022-04-22 ENCOUNTER — Ambulatory Visit: Admit: 2022-04-22 | Payer: MEDICARE | Attending: Family | Primary: Internal Medicine

## 2022-04-22 ENCOUNTER — Inpatient Hospital Stay: Admit: 2022-04-22 | Discharge: 2022-04-22 | Payer: MEDICARE | Primary: Internal Medicine

## 2022-04-22 DIAGNOSIS — R1310 Dysphagia, unspecified: Secondary | ICD-10-CM

## 2022-04-22 DIAGNOSIS — K449 Diaphragmatic hernia without obstruction or gangrene: Secondary | ICD-10-CM

## 2022-04-22 MED ORDER — BARIUM SULFATE 96 % (W/W) ORAL POWDER FOR SUSPENSION
96 % (w/w) | Freq: Once | ORAL | Status: DC | PRN
Start: 2022-04-22 — End: 2022-04-27

## 2022-04-22 MED ORDER — BARIUM SULFATE 98 % ORAL POWDER FOR SUSPENSION
98 % | Freq: Once | ORAL | Status: DC | PRN
Start: 2022-04-22 — End: 2022-04-27

## 2022-04-22 MED ORDER — SOD BICARB-CITRIC AC-SIMETH 2.21 GRAM-1.53 GRAM/4 GRAM GRANULES EFFERV
2.21-1.534 gram/4 gram | Freq: Once | ORAL | Status: DC | PRN
Start: 2022-04-22 — End: 2022-04-27

## 2022-04-23 ENCOUNTER — Encounter: Admit: 2022-04-23 | Payer: MEDICARE | Primary: Internal Medicine

## 2022-04-23 ENCOUNTER — Encounter: Admit: 2022-04-23 | Payer: PRIVATE HEALTH INSURANCE | Primary: Internal Medicine

## 2022-04-24 ENCOUNTER — Ambulatory Visit: Admit: 2022-04-24 | Payer: MEDICARE | Attending: Family | Primary: Internal Medicine

## 2022-04-24 ENCOUNTER — Encounter: Admit: 2022-04-24 | Payer: PRIVATE HEALTH INSURANCE | Attending: Family | Primary: Internal Medicine

## 2022-04-24 DIAGNOSIS — R1310 Dysphagia, unspecified: Secondary | ICD-10-CM

## 2022-04-24 DIAGNOSIS — I10 Essential (primary) hypertension: Secondary | ICD-10-CM

## 2022-04-24 DIAGNOSIS — J439 Emphysema, unspecified: Secondary | ICD-10-CM

## 2022-04-24 DIAGNOSIS — R1319 Other dysphagia: Secondary | ICD-10-CM

## 2022-04-24 DIAGNOSIS — K21 Gastroesophageal reflux disease with esophagitis without hemorrhage: Secondary | ICD-10-CM

## 2022-04-24 DIAGNOSIS — I493 Ventricular premature depolarization: Secondary | ICD-10-CM

## 2022-04-24 DIAGNOSIS — E785 Hyperlipidemia, unspecified: Secondary | ICD-10-CM

## 2022-04-24 DIAGNOSIS — U071 COVID-19: Secondary | ICD-10-CM

## 2022-04-24 DIAGNOSIS — I2510 Atherosclerotic heart disease of native coronary artery without angina pectoris: Secondary | ICD-10-CM

## 2022-04-24 DIAGNOSIS — Z951 Presence of aortocoronary bypass graft: Secondary | ICD-10-CM

## 2022-04-24 NOTE — Progress Notes
VIDEO TELEHEALTH VISIT: This clinician is part of the telehealth program and is conducting this visit in a currently approved location. For this visit the clinician and patient were present via interactive audio & video telecommunications system that permits real-time communications, via the [employer].Patient's use of the telehealth platform followed consent and acknowledges agreement to permit telehealth for this visit. [place] located in: [place] clinician is appropriately licensed in the above state to provide care for this visit. Other individuals present during the telehealth encounter and their role/relation: sonBecause this visit was completed over video, a hands-on physical exam was not performed. Patient/parent or guardian understands and knows to call back if condition changes. [place]Phone: [phone]: [phone]: 04/24/2022 Patient referred to [facility] for consultation by: Referring, NoNo address on fileReason for Referral:   ICD-10-CM  1. Gastroesophageal reflux disease with esophagitis without hemorrhage  K21.00   2. Esophageal dysphagia  R13.19    HPI: Raymond Gardner is a 82 y.o. male being interviewed today due to 1. Gastroesophageal reflux disease with esophagitis without hemorrhage    2. Esophageal dysphagia     I have reviewed the patient's referral records, including past medical history, past physical exams, pertinent labs, imaging and prior plans of care.  Pt seen in follow up s/p EGD with Dr Jeannene Patella and esophogram.Pt feeling generally well.  He has a successful strategy for preventing any swallowing issues- he knows to chew a lot and drink fluids with his meals.  He tends to stay away from meat just by preference.  He can eat and drink without restriction.  His weight is stable and describes his quality of life as good.  No foods get stuck.Medical History: PMH PSH Past Medical History: Diagnosis Date  Coronary artery disease   COVID-19 02/08/2022  Dysphagia   Essential hypertension   Hx of CABG   Hyperlipidemia   Premature ventricular contractions   Pulmonary emphysema (HC Code)   Past Surgical History: Procedure Laterality Date  CORONARY ARTERY BYPASS GRAFT  2010  Social History Family History Social History Tobacco Use  Smoking status: Former   Current packs/day: 0.00   Types: Cigarettes   Quit date: 1987   Years since quitting: 37.2  Smokeless tobacco: Never Substance Use Topics  Alcohol use: Yes   Comment: 7 glasses of wine per week, 1 per day  Family History Problem Relation Age of Onset  COPD Brother   Coronary Artery Disease Brother   Medications Current Outpatient Medications (CARDIOVASCULAR) Medication Sig  ezetimibe Take 1 tablet (10 mg total) by mouth daily.  rosuvastatin Take 1 tablet (40 mg total) by mouth daily.  valsartan TAKE 1 TABLET(80 MG) BY MOUTH DAILY Current Outpatient Medications (ANTIPLATELET DRUGS) Medication Sig  aspirin every 24 hours. Current Outpatient Medications (ANTIASTHMATICS) Medication Sig  montelukast TAKE 1 TABLET(10 MG) BY MOUTH EVERY NIGHT  Stiolto Respimat Inhale 2 Inhalation into the lungs daily. Current Outpatient Medications (PSYCHOTHERAPEUTIC DRUGS) Medication Sig  sertraline sertraline 50 mg tablet TAKE 1 TABLET BY MOUTH ONCE DAILY Current Outpatient Medications (ELECT/CALORIC/H2O) Medication Sig  magnesium oxide Take 1 tablet (400 mg total) by mouth daily. Current Outpatient Medications (GASTROINTESTINAL) Medication Sig  omeprazole TAKE 1 CAPSULE(20 MG) BY MOUTH DAILY  omeprazole Take 1 capsule (20 mg total) by mouth 2 (two) times daily. Facility-Administered Medications Ordered in Other Visits (GASTROINTESTINAL) Medication  sod bicarb-citric ac-simeth Facility-Administered Medications Ordered in Other Visits (DIAGNOSTIC) Medication  barium sulfate  barium sulfate   Allergies No Known  Allergies Family History: non-contributoryReview of Systems:  Pt reports any of the following within the last 2 weeks: [x] Denies any symptoms other than mentioned in HPI  [] Patient unable to provide meaningful ROS information because of language or cognitive limitations. [] Unintended weight loss >5lbs []  Unintended weight gain >5lbs [] Poor appetite [] Feels full quickly when eating [] Fevers/chills [] Feels tired sooner than others [] Jaundice [] Yellowing of eyes [] Sore throat [] Hoarseness or weak voice [] Shortness of breath [] Chronic cough [] Chest pain or discomfort:       [] Burning; [] Non-burning [] Palpitations [] Difficulty swallowing [] Food or liquid come back up to the throat:     [] Food; [] Liquid [] Hiccups [] Belching/burping [] Nausea or vomiting:     [] Nausea; [] Vomiting [] Abdominal distension or bloating [] Abdominal pain or discomfort level_____/10 on 0-10 scale.  [] Diarrhea [] Constipation [] Fecal incontinence (loss of control of stool) [] Rectal bleeding [] Blood clots in arms or legs     [] Arms; [] Legs [] Swelling of arms or legs     [] Arms; [] Legs []  Use of Blood Thinner other than aspirin []  Issues with Anesthesia in past [] Difficulty or pain during urination [] Pain in the:     [] Muscles [] Joints [] Skin rashes [] Easy bleeding or bruising [] Frequent or Severe Headaches [] Dizziness [] Problems with balance [] Loss of consciousness [] Tingling or weakness in arms or legs     [] Arms; [] Legs [] Seizures [] Problem with      [] Memory [] Paying attention [] Problems with      [] Anxiety [] Depression Objective: Limited physical exam was performed due to telehealth visit. There were no vitals filed for this visit.  Physical Exam:Weight: Wt Readings from Last 1 Encounters: 04/24/22 82.1 kg  Gen: pleasant in NADRespirations: normal effortNeuro: AxOx3 Skin shows no signs of jaundiceLabs:No results found for: LIPASETotal Bilirubin Date Value Ref Range Status 02/13/2022 0.6 0.2 - 1.2 mg/dL Final   Comment:   Use of this assay is not recommended for patients undergoing treatment with Eltrombopag due to the potential for falsely elevated results.  02/12/2022 0.4 0.2 - 1.2 mg/dL Final   Comment:   Use of this assay is not recommended for patients undergoing treatment with Eltrombopag due to the potential for falsely elevated results.  02/10/2022 0.3 0.2 - 1.2 mg/dL Final   Comment:   Use of this assay is not recommended for patients undergoing treatment with Eltrombopag due to the potential for falsely elevated results.  02/09/2022 0.3 0.2 - 1.2 mg/dL Final   Comment:   Use of this assay is not recommended for patients undergoing treatment with Eltrombopag due to the potential for falsely elevated results.  Aspartate Aminotransferase (AST) Date Value Ref Range Status 02/13/2022 17 10 - 42 U/L Final 02/12/2022 18 10 - 42 U/L Final 02/10/2022 20 10 - 42 U/L Final 02/09/2022 24 10 - 42 U/L Final Alanine Aminotransferase (ALT) Date Value Ref Range Status 02/13/2022 21 14 - 63 U/L Final 02/12/2022 23 14 - 63 U/L Final 02/10/2022 18 14 - 63 U/L Final 02/09/2022 17 14 - 63 U/L Final Alkaline Phosphatase Date Value Ref Range Status 02/13/2022 49 46 - 116 U/L Final 02/12/2022 47 46 - 116 U/L Final 02/10/2022 52 46 - 116 U/L Final 02/09/2022 64 46 - 116 U/L Final Prothrombin Time Date Value Ref Range Status 03/19/2022 11.0 9.6 - 12.3 seconds Final 02/07/2022 10.3 9.5 - 12.1 seconds Final Albumin Date Value Ref Range Status 02/13/2022 2.3 (L) 3.4 - 5.0 g/dL Final 09/60/4540 2.5 (L) 3.4 - 5.0 g/dL Final 98/12/9145 2.4 (L) 3.4 - 5.0 g/dL Final 82/95/6213 2.7 (L) 3.4 - 5.0 g/dL Final  No results found for: IGGResults for orders placed or performed in visit on 04/23/22 EKG - Performed during office visit) Result Value Ref Range  Heart Rate 82 bpm  QRS Interval 92 ms  QT Interval 366 ms  QTC Interval 427 ms  P Axis 57 deg  QRS Axis 65 deg  T Wave Axis 33 deg  P-R Interval 160 msec  SEVERITY Abnormal ECG severity Imaging:FL Esophagram w Air ContrastResult Date: 3/11/2024FL ESOPHAGRAM W AIR CONTRAST [facility]) History:    hiatal hernia, dysphagia. . COMPARISON:   [place] 02/07/2022 Double contrast esophagram (with Air) was performed under fluoroscopic control. Patient is status post previous CABG. The examination show the swallowing mechanism to appear unremarkable. The esophagus has a normal appearing primary stripping wave. No definite intrinsic or extrinsic mass lesions can be seen of the esophagus. No obstruction to the flow of barium is seen into the stomach. A large hiatal hernia is identified with intrathoracic location of the stomach, as was seen on the [place] images. 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 No fluoroscopically identified gastroesophageal reflux is seen. It should be of note, the patient was short of breath the exam was terminated. After approximately 5 minutes of supine resting, the patient was clinically asymptomatic.. Impression: Large hiatal hernia containing the stomach. The stomach is rotated and a mesenteric axial rotation. No volvulus is seen. Fluoroscopy time: Recorded in epic Fluoroscopy images:  18 Reported and signed by: Donivan Scull, MD  [facility] and Biomedical Imaging Holter Monitor - 3 TO 7 DAYResult Date: 2/21/2024The 3 day Holter monitor revealed sinus rhythm. Average heart rate 74, low 47, and high sinus HR 120. There were 805 SVE's with 30 couplets and 27 atrial runs, longest 11 beats rate 119 and fastest 3 beats rate 164. There were 4,150 PVCs with 161 couplets. Short runs of trigeminy. The event button was not activated.Echo 2D Complete w Doppler and CFI if Ind Image Enhancement 3D and or bubblesResult Date: 03/14/2022~ * Normal left ventricular size, wall thickness, systolic function and wall motion. LVEF calculated by biplane Simpson's was 58%.  Normal diastolic function and filling pressures.* Normal right ventricular cavity size and systolic function.* Moderate aortic valve calcification.  Mild aortic stenosis.  Peak aortic velocity is 2.5 m/s with a calculated peak aortic gradient of 26 mmHg.  Mean gradient is 13 mmHg.  Aortic valve area is 1.4 cm2.  Dimensionless index is 0.44.  Mild aortic regurgitation.* Normal mitral valve leaflets.  Mild posterior mitral annular calcification.  Trace mitral regurgitation.* Mild tricuspid regurgitation with normal right heart pressures.* No significant pericardial effusion.* No prior study available for comparison.CTA Chest (PE) w IV ContrastResult Date: 12/28/2023CTA CHEST (PE) W IV CONTRAST INDICATION: Pulmonary embolism (PE) suspected, high prob COMPARISON: NONE TECHNIQUE: [place] images of the chest were obtained from the lung bases through the apices after the intravenous administration of contrast. Coronal and oblique 3D/MIPS reformats are provided. IV CONTRAST: 80 mL Omnipaque 350 TECHNICAL LIMITATIONS: None. FINDINGS: PULMONARY ARTERIES: There is no evidence of filling defects in the pulmonary arteries to suspect pulmonary embolism. HEART: The heart is within normal limits for size. RV/LV RATIO: The RV/LV ratio measures less than 1.  There is no reflux of contrast into the IVC or hepatic veins. SYSTEMIC VASCULATURE: Aorta and major branches are unremarkable. LUNGS: Upper lobe predominant emphysema. Right lower lobe atelectasis secondary to large adjacent hiatal hernia. AIRWAYS: Unremarkable. PLEURA: Unremarkable. MEDIASTINUM: Large hiatal hernia with herniation of the entire stomach as well as  loops of colon.Marland Kitchen LYMPH NODES: No adenopathy. UPPER ABDOMEN: Limited evaluation of the upper abdomen is unremarkable. BONES & SOFT TISSUES: Unremarkable. These findings were corroborated on the MIP images.  No evidence of pulmonary embolism or acute thoracic pathology. Large hiatal hernia with intrathoracic stomach and containing nonobstructed portions of the colon. [facility] Notify System Classification: Routine. Report initiated by:  Verline Lema, MD Reported and signed by: Channing Mutters, MD  [facility] Radiology and Biomedical Imaging ED Diagnostic Ultrasound EchoResult Date: 12/28/2023Disclaimer:Please look in ED Provider Note for the Impression.  Assessment/Plan: Raymond Gardner is a 82 y.o. male being interviewed today for 1. Gastroesophageal reflux disease with esophagitis without hemorrhage    2. Esophageal dysphagia    Reviewed procedure and esophogram.  Pt with large hiatal hernia with rotation.  Pt understands risk for volvulus and is hesitant for any intervention.He is agreeable to talking to surgeon and discussing options.Referral placed to [place].-Recommendations:Continue diet as tolerated.Avoid steak & chicken unless small bites and thoroughly chewed.Fish, scrambled eggs and ground meat may be better tolerated.All food bites should be accompanied with a drink of liquid.  Cola and other sodas can help break down the food. Thank you for the opportunity to see this patient in consultation.  We greatly appreciate assisting in your patient's care.  If there are further questions, please do not hesitate to contact us.Best Regards,Electronically Signed by Floreen Comber APRN 04/24/2022 Floreen Comber APRN MSN/MPHNurse Practitioner Clinical CoordinatorAdvanced Endoscopy & Pancreatic DiseasesDigestive [facility] of MedicineTel: [phone] Fax: [phone]

## 2022-05-15 ENCOUNTER — Encounter: Admit: 2022-05-15 | Payer: PRIVATE HEALTH INSURANCE | Attending: Internal Medicine | Primary: Internal Medicine

## 2022-05-15 MED ORDER — STIOLTO RESPIMAT 2.5 MCG-2.5 MCG/ACTUATION SOLUTION FOR INHALATION
2.5-2.5 | Freq: Every day | RESPIRATORY_TRACT | 12 refills | 90.00000 days | Status: AC
Start: 2022-05-15 — End: ?

## 2022-05-21 ENCOUNTER — Encounter: Admit: 2022-05-21 | Payer: MEDICARE | Attending: Internal Medicine | Primary: Internal Medicine

## 2022-06-07 ENCOUNTER — Emergency Department (HOSPITAL_COMMUNITY): Payer: Medicare Other

## 2022-06-07 ENCOUNTER — Encounter (HOSPITAL_COMMUNITY): Payer: Self-pay

## 2022-06-07 ENCOUNTER — Emergency Department (HOSPITAL_BASED_OUTPATIENT_CLINIC_OR_DEPARTMENT_OTHER): Admit: 2022-06-07 | Discharge: 2022-06-07 | Disposition: A | Discharge status: Home / Self Care | Payer: Medicare Other

## 2022-06-07 ENCOUNTER — Emergency Department (HOSPITAL_COMMUNITY)
Admission: EM | Admit: 2022-06-07 | Discharge: 2022-06-07 | Disposition: A | Discharge status: Home / Self Care | Payer: Medicare Other | Source: Home / Self Care | Attending: Emergency Medicine | Admitting: Emergency Medicine

## 2022-06-07 ENCOUNTER — Other Ambulatory Visit: Payer: Self-pay

## 2022-06-07 DIAGNOSIS — Z79899 Other long term (current) drug therapy: Secondary | ICD-10-CM | POA: Insufficient documentation

## 2022-06-07 DIAGNOSIS — J449 Chronic obstructive pulmonary disease, unspecified: Secondary | ICD-10-CM | POA: Insufficient documentation

## 2022-06-07 DIAGNOSIS — I2510 Atherosclerotic heart disease of native coronary artery without angina pectoris: Secondary | ICD-10-CM | POA: Insufficient documentation

## 2022-06-07 DIAGNOSIS — M79661 Pain in right lower leg: Secondary | ICD-10-CM | POA: Diagnosis not present

## 2022-06-07 DIAGNOSIS — Z951 Presence of aortocoronary bypass graft: Secondary | ICD-10-CM | POA: Insufficient documentation

## 2022-06-07 DIAGNOSIS — M1611 Unilateral primary osteoarthritis, right hip: Secondary | ICD-10-CM | POA: Insufficient documentation

## 2022-06-07 DIAGNOSIS — G9341 Metabolic encephalopathy: Secondary | ICD-10-CM | POA: Diagnosis not present

## 2022-06-07 DIAGNOSIS — I10 Essential (primary) hypertension: Secondary | ICD-10-CM | POA: Insufficient documentation

## 2022-06-07 DIAGNOSIS — E860 Dehydration: Secondary | ICD-10-CM | POA: Diagnosis not present

## 2022-06-07 DIAGNOSIS — W07XXXA Fall from chair, initial encounter: Secondary | ICD-10-CM | POA: Insufficient documentation

## 2022-06-07 MED ORDER — OXYCODONE HCL 5 MG PO TABS: 5.0000 mg | ORAL_TABLET | ORAL | 0 refills | Status: DC | PRN

## 2022-06-07 NOTE — Progress Notes (Signed)
Right lower extremity venous duplex has been completed. Preliminary results can be found in CV Proc through chart review.  Results were given to [facility].  06/07/22 4:33 PM Olen Cordial RVT

## 2022-06-07 NOTE — ED Provider Notes (Signed)
[place] EMERGENCY DEPARTMENT AT [facility] Provider Note   CSN: [id number] Arrival date & time: 06/07/22  1333     History {Add pertinent medical, surgical, social history, OB history to HPI:1} Chief Complaint  Patient presents with   Dave Silva. is a 82 y.o. male.   Fall     Patient with medical history of COPD, hypertension, hyperlipidemia, CAD status post CABG x 4 presents to the emergency department due to fall.  [place] was in [place] and had a fall on 05/29/2022.  He was eating, had a sneezing fit and then had a witnessed syncopal event.  He slid out of the chair landing on his right side.  He was seen in the ED in [place], diagnosed with a femoral hip fracture and given anti-inflammatories but no immobilization.  He was walking on his hip until this morning, he returned back to the [place] yesterday and has been significant pain to the hip and unable to ambulate.  Called family member who is a doctor who told come to the ED for further evaluation.  He denies any chest pain or shortness of breath, no loss of bladder or bowel function, no pain other than right hip.  No syncopal event or presyncope since sneezing fit which precipitated his fall on 417/24.  Home Medications Prior to Admission medications   Medication Sig Start Date End Date Taking? Authorizing Provider  aspirin 81 MG EC tablet Take 81 mg by mouth daily. Swallow whole.    [provider]  Cholecalciferol (VITAMIN D3) 50 MCG (2000 UT) TABS Take 4,000 Units by mouth daily.    [provider]  ezetimibe (ZETIA) 10 MG tablet Take 1 tablet (10 mg total) by mouth daily. 02/21/20   Jake Bathe, MD  fluticasone (FLONASE) 50 MCG/ACT nasal spray Place into both nostrils daily.    [provider]  montelukast (SINGULAIR) 10 MG tablet Take 10 mg by mouth at bedtime.    [provider]  omeprazole (PRILOSEC) 20 MG capsule TAKE 1 CAPSULE BY MOUTH EVERY DAY  01/08/21   Ardith Dark, MD  rosuvastatin (CRESTOR) 20 MG tablet Take 1 tablet (20 mg total) by mouth daily. 02/21/20   Jake Bathe, MD  sertraline (ZOLOFT) 50 MG tablet Take 1 tablet (50 mg total) by mouth daily. 07/14/20   Ardith Dark, MD  Tiotropium Bromide-Olodaterol (STIOLTO RESPIMAT) 2.5-2.5 MCG/ACT AERS Inhale 2 puffs into the lungs daily. 07/10/20   Nyoka Cowden, MD  valsartan (DIOVAN) 80 MG tablet Take 1 tablet (80 mg total) by mouth daily. 04/10/20   Nyoka Cowden, MD      Allergies    Patient has no known allergies.    Review of Systems   Review of Systems  Physical Exam Updated Vital Signs BP 111/66 (BP Location: Right Arm)   Pulse 64   Temp 98.3 F (36.8 C) (Oral)   Resp 16   Wt 78 kg   SpO2 97%   BMI 23.98 kg/m  Physical Exam Vitals and nursing note reviewed. Exam conducted with a chaperone present.  Constitutional:      Appearance: Normal appearance.  HENT:     Head: Normocephalic and atraumatic.  Eyes:     General: No scleral icterus.       Right eye: No discharge.        Left eye: No discharge.     Extraocular Movements: Extraocular movements intact.  Pupils: Pupils are equal, round, and reactive to light.  Cardiovascular:     Rate and Rhythm: Normal rate and regular rhythm.     Pulses: Normal pulses.     Heart sounds: Murmur heard.     No friction rub. No gallop.  Pulmonary:     Effort: Pulmonary effort is normal. No respiratory distress.     Breath sounds: Normal breath sounds.  Abdominal:     General: Abdomen is flat. Bowel sounds are normal. There is no distension.     Palpations: Abdomen is soft.     Tenderness: There is no abdominal tenderness.  Musculoskeletal:        General: Tenderness present.     Comments: TTP right hip.  No tenderness to knee, ankle bilaterally, left hip without tenderness.  Moving upper extremity without any difficulty.  Trace lower extremity edema right  Skin:    General: Skin is warm and dry.      Coloration: Skin is not jaundiced.  Neurological:     Mental Status: He is alert. Mental status is at baseline.     Coordination: Coordination normal.     ED Results / Procedures / Treatments   Labs (all labs ordered are listed, but only abnormal results are displayed) Labs Reviewed - No data to display  EKG None  Radiology No results found.  Procedures Procedures  {Document cardiac monitor, telemetry assessment procedure when appropriate:1}  Medications Ordered in ED Medications - No data to display  ED Course/ Medical Decision Making/ A&P   {   Click here for ABCD2, HEART and other calculatorsREFRESH Note before signing :1}                          Medical Decision Making Amount and/or Complexity of Data Reviewed Radiology: ordered.  Risk Prescription drug management.     {Document critical care time when appropriate:1} {Document review of labs and clinical decision tools ie heart score, Chads2Vasc2 etc:1}  {Document your independent review of radiology images, and any outside records:1} {Document your discussion with family members, caretakers, and with consultants:1} {Document social determinants of health affecting pt's care:1} {Document your decision making why or why not admission, treatments were needed:1} Final Clinical Impression(s) / ED Diagnoses Final diagnoses:  None    Rx / DC Orders ED Discharge Orders     None

## 2022-06-07 NOTE — ED Triage Notes (Signed)
BIBA for Right upper leg pain since Fall on 4/17 in [place].  Decreased ROM, difficulty walking.  Xray performed in [place] showed femur head fracture.

## 2022-06-07 NOTE — Discharge Instructions (Signed)
You are seen today in the emergency department due to right hip pain.  As we discussed there were no signs of fractures dislocations on the CT or x-ray of your hip.  The ultrasound was negative for any blood clots.  Take the Oxy every 6 hours as needed for severe pain.  Also take Tylenol for breakthrough pain.  Follow-up with orthopedics for reevaluation continue management of the osteoarthritis

## 2022-06-10 ENCOUNTER — Emergency Department (HOSPITAL_COMMUNITY): Payer: Medicare Other

## 2022-06-10 ENCOUNTER — Other Ambulatory Visit: Payer: Self-pay

## 2022-06-10 ENCOUNTER — Inpatient Hospital Stay (HOSPITAL_COMMUNITY): Payer: Medicare Other

## 2022-06-10 ENCOUNTER — Inpatient Hospital Stay (HOSPITAL_COMMUNITY)
Admission: EM | Admit: 2022-06-10 | Discharge: 2022-06-15 | DRG: 071 | Disposition: A | Discharge status: Home / Self Care | Payer: Medicare Other | Attending: Internal Medicine | Admitting: Internal Medicine

## 2022-06-10 ENCOUNTER — Encounter (HOSPITAL_COMMUNITY): Payer: Self-pay

## 2022-06-10 DIAGNOSIS — Z951 Presence of aortocoronary bypass graft: Secondary | ICD-10-CM

## 2022-06-10 DIAGNOSIS — I10 Essential (primary) hypertension: Secondary | ICD-10-CM | POA: Diagnosis present

## 2022-06-10 DIAGNOSIS — I4510 Unspecified right bundle-branch block: Secondary | ICD-10-CM | POA: Diagnosis present

## 2022-06-10 DIAGNOSIS — R3912 Poor urinary stream: Secondary | ICD-10-CM | POA: Diagnosis present

## 2022-06-10 DIAGNOSIS — Z87891 Personal history of nicotine dependence: Secondary | ICD-10-CM

## 2022-06-10 DIAGNOSIS — N179 Acute kidney failure, unspecified: Secondary | ICD-10-CM | POA: Diagnosis present

## 2022-06-10 DIAGNOSIS — R001 Bradycardia, unspecified: Secondary | ICD-10-CM | POA: Diagnosis not present

## 2022-06-10 DIAGNOSIS — I2510 Atherosclerotic heart disease of native coronary artery without angina pectoris: Secondary | ICD-10-CM | POA: Diagnosis present

## 2022-06-10 DIAGNOSIS — N35919 Unspecified urethral stricture, male, unspecified site: Secondary | ICD-10-CM | POA: Diagnosis present

## 2022-06-10 DIAGNOSIS — N320 Bladder-neck obstruction: Secondary | ICD-10-CM

## 2022-06-10 DIAGNOSIS — Z79899 Other long term (current) drug therapy: Secondary | ICD-10-CM

## 2022-06-10 DIAGNOSIS — F325 Major depressive disorder, single episode, in full remission: Secondary | ICD-10-CM | POA: Diagnosis present

## 2022-06-10 DIAGNOSIS — E861 Hypovolemia: Secondary | ICD-10-CM | POA: Diagnosis present

## 2022-06-10 DIAGNOSIS — J4489 Other specified chronic obstructive pulmonary disease: Secondary | ICD-10-CM | POA: Diagnosis present

## 2022-06-10 DIAGNOSIS — D7589 Other specified diseases of blood and blood-forming organs: Secondary | ICD-10-CM | POA: Diagnosis present

## 2022-06-10 DIAGNOSIS — N3289 Other specified disorders of bladder: Secondary | ICD-10-CM | POA: Diagnosis present

## 2022-06-10 DIAGNOSIS — D696 Thrombocytopenia, unspecified: Secondary | ICD-10-CM | POA: Diagnosis present

## 2022-06-10 DIAGNOSIS — M1611 Unilateral primary osteoarthritis, right hip: Secondary | ICD-10-CM | POA: Diagnosis present

## 2022-06-10 DIAGNOSIS — K4090 Unilateral inguinal hernia, without obstruction or gangrene, not specified as recurrent: Secondary | ICD-10-CM | POA: Diagnosis present

## 2022-06-10 DIAGNOSIS — R55 Syncope and collapse: Secondary | ICD-10-CM | POA: Diagnosis present

## 2022-06-10 DIAGNOSIS — J439 Emphysema, unspecified: Secondary | ICD-10-CM | POA: Diagnosis present

## 2022-06-10 DIAGNOSIS — J320 Chronic maxillary sinusitis: Secondary | ICD-10-CM | POA: Diagnosis present

## 2022-06-10 DIAGNOSIS — E785 Hyperlipidemia, unspecified: Secondary | ICD-10-CM | POA: Diagnosis present

## 2022-06-10 DIAGNOSIS — G9341 Metabolic encephalopathy: Secondary | ICD-10-CM | POA: Diagnosis present

## 2022-06-10 DIAGNOSIS — K219 Gastro-esophageal reflux disease without esophagitis: Secondary | ICD-10-CM | POA: Diagnosis present

## 2022-06-10 DIAGNOSIS — E871 Hypo-osmolality and hyponatremia: Secondary | ICD-10-CM | POA: Diagnosis present

## 2022-06-10 DIAGNOSIS — E860 Dehydration: Secondary | ICD-10-CM | POA: Diagnosis present

## 2022-06-10 DIAGNOSIS — W07XXXA Fall from chair, initial encounter: Secondary | ICD-10-CM | POA: Diagnosis present

## 2022-06-10 DIAGNOSIS — Z9079 Acquired absence of other genital organ(s): Secondary | ICD-10-CM

## 2022-06-10 DIAGNOSIS — R339 Retention of urine, unspecified: Secondary | ICD-10-CM

## 2022-06-10 DIAGNOSIS — Z8546 Personal history of malignant neoplasm of prostate: Secondary | ICD-10-CM

## 2022-06-10 DIAGNOSIS — Z7982 Long term (current) use of aspirin: Secondary | ICD-10-CM

## 2022-06-10 DIAGNOSIS — M25551 Pain in right hip: Secondary | ICD-10-CM | POA: Diagnosis present

## 2022-06-10 DIAGNOSIS — J449 Chronic obstructive pulmonary disease, unspecified: Secondary | ICD-10-CM | POA: Diagnosis present

## 2022-06-10 DIAGNOSIS — M48062 Spinal stenosis, lumbar region with neurogenic claudication: Secondary | ICD-10-CM | POA: Diagnosis present

## 2022-06-10 DIAGNOSIS — Z888 Allergy status to other drugs, medicaments and biological substances status: Secondary | ICD-10-CM

## 2022-06-10 DIAGNOSIS — R008 Other abnormalities of heart beat: Secondary | ICD-10-CM | POA: Diagnosis not present

## 2022-06-10 DIAGNOSIS — Z7952 Long term (current) use of systemic steroids: Secondary | ICD-10-CM

## 2022-06-10 HISTORY — DX: Atherosclerotic heart disease of native coronary artery without angina pectoris: I25.10

## 2022-06-10 LAB — COMPREHENSIVE METABOLIC PANEL
ALT: 15 U/L (ref 0–44)
AST: 19 U/L (ref 15–41)
Albumin: 3.4 g/dL — ABNORMAL LOW (ref 3.5–5.0)
Alkaline Phosphatase: 45 U/L (ref 38–126)
Anion gap: 8 (ref 5–15)
BUN: 31 mg/dL — ABNORMAL HIGH (ref 8–23)
CO2: 21 mmol/L — ABNORMAL LOW (ref 22–32)
Calcium: 8.4 mg/dL — ABNORMAL LOW (ref 8.9–10.3)
Chloride: 98 mmol/L (ref 98–111)
Creatinine, Ser: 1.54 mg/dL — ABNORMAL HIGH (ref 0.61–1.24)
GFR, Estimated: 45 mL/min — ABNORMAL LOW (ref >60)
Glucose, Bld: 100 mg/dL — ABNORMAL HIGH (ref 70–99)
Potassium: 4.5 mmol/L (ref 3.5–5.1)
Sodium: 127 mmol/L — ABNORMAL LOW (ref 135–145)
Total Bilirubin: 0.8 mg/dL (ref 0.3–1.2)
Total Protein: 6.4 g/dL — ABNORMAL LOW (ref 6.5–8.1)

## 2022-06-10 LAB — CBC
HCT: 43.4 % (ref 39.0–52.0)
Hemoglobin: 15 g/dL (ref 13.0–17.0)
MCH: 35 pg — ABNORMAL HIGH (ref 26.0–34.0)
MCHC: 34.6 g/dL (ref 30.0–36.0)
MCV: 101.2 fL — ABNORMAL HIGH (ref 80.0–100.0)
Platelets: 137 10*3/uL — ABNORMAL LOW (ref 150–400)
RBC: 4.29 MIL/uL (ref 4.22–5.81)
RDW: 13.3 % (ref 11.5–15.5)
WBC: 9.8 10*3/uL (ref 4.0–10.5)
nRBC: 0 % (ref 0.0–0.2)

## 2022-06-10 LAB — URINALYSIS, ROUTINE W REFLEX MICROSCOPIC
Bilirubin Urine: NEGATIVE
Glucose, UA: NEGATIVE mg/dL
Ketones, ur: NEGATIVE mg/dL
Leukocytes,Ua: NEGATIVE
Nitrite: NEGATIVE
Protein, ur: NEGATIVE mg/dL
Specific Gravity, Urine: 1.006 (ref 1.005–1.030)
pH: 6 (ref 5.0–8.0)

## 2022-06-10 LAB — CK: Total CK: 34 U/L — ABNORMAL LOW (ref 49–397)

## 2022-06-10 LAB — OSMOLALITY: Osmolality: 289 mOsm/kg (ref 275–295)

## 2022-06-10 LAB — SODIUM, URINE, RANDOM: Sodium, Ur: 14 mmol/L

## 2022-06-10 LAB — TSH: TSH: 1.989 u[IU]/mL (ref 0.350–4.500)

## 2022-06-10 MED ORDER — MONTELUKAST SODIUM 10 MG PO TABS
10.0000 mg | ORAL_TABLET | Freq: Every day | ORAL | Status: DC
  Administered 2022-06-10 – 2022-06-14 (×5): 10 mg via ORAL
  Filled 2022-06-10 (×5): qty 1

## 2022-06-10 MED ORDER — ONDANSETRON HCL 4 MG PO TABS: 4.0000 mg | ORAL_TABLET | Freq: Four times a day (QID) | ORAL | Status: DC | PRN

## 2022-06-10 MED ORDER — FLUTICASONE PROPIONATE 50 MCG/ACT NA SUSP: 1.0000 | Freq: Every day | NASAL | Status: DC | PRN

## 2022-06-10 MED ORDER — ARFORMOTEROL TARTRATE 15 MCG/2ML IN NEBU: 15.0000 ug | INHALATION_SOLUTION | Freq: Two times a day (BID) | RESPIRATORY_TRACT | Status: DC

## 2022-06-10 MED ORDER — OXYCODONE HCL 5 MG PO TABS
5.0000 mg | ORAL_TABLET | Freq: Four times a day (QID) | ORAL | Status: DC | PRN
  Administered 2022-06-11 – 2022-06-12 (×2): 5 mg via ORAL
  Filled 2022-06-10 (×2): qty 1

## 2022-06-10 MED ORDER — ROSUVASTATIN CALCIUM 10 MG PO TABS
40.0000 mg | ORAL_TABLET | Freq: Every day | ORAL | Status: DC
  Administered 2022-06-11 – 2022-06-15 (×5): 40 mg via ORAL
  Filled 2022-06-10 (×2): qty 4
  Filled 2022-06-10: qty 2
  Filled 2022-06-10 (×4): qty 4

## 2022-06-10 MED ORDER — UMECLIDINIUM BROMIDE 62.5 MCG/ACT IN AEPB: 1.0000 | INHALATION_SPRAY | Freq: Every day | RESPIRATORY_TRACT | Status: DC

## 2022-06-10 MED ORDER — IRBESARTAN 75 MG PO TABS
75.0000 mg | ORAL_TABLET | Freq: Every day | ORAL | Status: DC
  Administered 2022-06-10 – 2022-06-15 (×6): 75 mg via ORAL
  Filled 2022-06-10 (×6): qty 1

## 2022-06-10 MED ORDER — MAGNESIUM OXIDE -MG SUPPLEMENT 400 (240 MG) MG PO TABS
400.0000 mg | ORAL_TABLET | Freq: Every day | ORAL | Status: DC
  Administered 2022-06-11 – 2022-06-15 (×5): 400 mg via ORAL
  Filled 2022-06-10 (×5): qty 1

## 2022-06-10 MED ORDER — SODIUM CHLORIDE 0.9 % IV SOLN: INTRAVENOUS | Status: AC

## 2022-06-10 MED ORDER — LACTATED RINGERS IV BOLUS
500.0000 mL | Freq: Once | INTRAVENOUS | Status: AC
  Administered 2022-06-10: 500 mL via INTRAVENOUS

## 2022-06-10 MED ORDER — VITAMIN D3 25 MCG (1000 UNIT) PO TABS
4000.0000 [IU] | ORAL_TABLET | Freq: Every day | ORAL | Status: DC
  Administered 2022-06-11 – 2022-06-15 (×5): 4000 [IU] via ORAL
  Filled 2022-06-10 (×5): qty 4

## 2022-06-10 MED ORDER — ARFORMOTEROL TARTRATE 15 MCG/2ML IN NEBU
15.0000 ug | INHALATION_SOLUTION | Freq: Two times a day (BID) | RESPIRATORY_TRACT | Status: DC
  Administered 2022-06-10 – 2022-06-15 (×10): 15 ug via RESPIRATORY_TRACT
  Filled 2022-06-10 (×10): qty 2

## 2022-06-10 MED ORDER — ALBUTEROL SULFATE HFA 108 (90 BASE) MCG/ACT IN AERS: 2.0000 | INHALATION_SPRAY | Freq: Four times a day (QID) | RESPIRATORY_TRACT | Status: DC | PRN

## 2022-06-10 MED ORDER — ONDANSETRON HCL 4 MG/2ML IJ SOLN
4.0000 mg | Freq: Four times a day (QID) | INTRAMUSCULAR | Status: DC | PRN
  Administered 2022-06-11 – 2022-06-12 (×2): 4 mg via INTRAVENOUS
  Filled 2022-06-10 (×2): qty 2

## 2022-06-10 MED ORDER — ALBUTEROL SULFATE (2.5 MG/3ML) 0.083% IN NEBU: 2.5000 mg | INHALATION_SOLUTION | Freq: Four times a day (QID) | RESPIRATORY_TRACT | Status: DC | PRN

## 2022-06-10 MED ORDER — OXYCODONE HCL 5 MG PO TABS: 5.0000 mg | ORAL_TABLET | ORAL | Status: DC | PRN

## 2022-06-10 MED ORDER — PANTOPRAZOLE SODIUM 40 MG PO TBEC
40.0000 mg | DELAYED_RELEASE_TABLET | Freq: Every day | ORAL | Status: DC
  Administered 2022-06-11 – 2022-06-15 (×5): 40 mg via ORAL
  Filled 2022-06-10 (×5): qty 1

## 2022-06-10 MED ORDER — UMECLIDINIUM BROMIDE 62.5 MCG/ACT IN AEPB
1.0000 | INHALATION_SPRAY | Freq: Every day | RESPIRATORY_TRACT | Status: DC
  Administered 2022-06-11 – 2022-06-15 (×5): 1 via RESPIRATORY_TRACT
  Filled 2022-06-10: qty 7

## 2022-06-10 NOTE — Assessment & Plan Note (Signed)
Stable

## 2022-06-10 NOTE — Assessment & Plan Note (Signed)
Continue losartan. 

## 2022-06-10 NOTE — ED Notes (Signed)
Dr A ok'd for patient to go to MRI off tele and then be transported from MRI to the floor.

## 2022-06-10 NOTE — H&P (Signed)
History and Physical    Patient: Dave Silva. ZOX:[id number] DOB: [birth year] DOA: 06/10/2022 DOS: the patient was seen and examined on 06/10/2022 PCP: Patient, No Pcp Per  Patient coming from: Home  Chief Complaint:  Chief Complaint  Patient presents with   Leg Pain   Fatigue    Most of the history was obtained from patient's daughter-in-law at the bedside. HPI: Alfons Sulkowski. is a 82 y.o. male with medical history significant for asthma/COPD, hypertension, dyslipidemia, coronary artery disease status post CABG x 4 who presents to the emergency room for evaluation of weakness and fall on the day of admission. Per patient's daughter-in-law patient was in [place] and fell on 05/29/22 while at a restaurant.  Patient was sitting in a chair and according to the daughter-in-law had a sneezing spell and passed out and fell out of his chair landing on his right side.  He had right hip pain following the fall and according to his daughter in law, he was taken to a clinic where he had an x-ray done and was told he did not have a fracture.  Patient returned to the [place] on 05/30/22 and was able to ambulate to the second floor of his son's house without difficulty but he noted that the next morning he was unable to ambulate due to severe pain in his right hip so he was brought to the ER at [facility] for further evaluation. He had a CT scan of the right hip done without contrast which showed no acute fracture. Moderate right femoroacetabular osteoarthritis. Mild-to-moderate right fat containing inguinal hernia.  He also had bilateral lower extremity venous Doppler done which was negative for DVT.  He was referred to orthopedic surgery as an outpatient. Per his daughter-in-law, there was seen at the outpatient orthopedic clinic and he was prescribed prednisone, oxycodone and gabapentin for pain control.  He was scheduled for an outpatient MRI to rule out an occult fracture. Per family  patient has been taking oxycodone as needed for pain without any problems.  He started using a rolling walker for ambulation due to severe pain in his right hip and increased risk for falls.  On the day of his admission his daughter-in-law notes that he had just returned from a doctor's appointment and the patient had taken a nap.  He woke up to use the bathroom and on his way back he slid to the floor landing on his knees.  She states that his knees gave way and he did not trip on anything and there was no loss of consciousness or head trauma.  She tried to assist him up but his knees buckled under him again and so she decided to bring him to the ER for further evaluation. She notes that he has had nausea and episodes of vomiting.  Oral intake has not been.  He denies having any changes in his bowel habits and denies having any abdominal pain.   He denies having any fever, no chills, no cough, no chest pain, no shortness of breath, no urinary symptoms, no blurred vision no focal deficit. Abnormal labs include sodium 127, BUN 31, creatinine 1.54 above a baseline of 0.93 CT scan of the head without contrast no acute intracranial findings are seen. Atrophy. 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 Chest x-ray - Postop chest. Hiatal hernia  Twelve-lead EKG reviewed by me shows sinus rhythm, incomplete right bundle branch, low voltage QRS Patient received 500 mL of LR and an MRI  of the right hip and lumbar spine has been ordered to rule out an occult fracture.     Review of Systems: As mentioned in the history of present illness. All other systems reviewed and are negative. Past Medical History:  Diagnosis Date   Asthma    Cancer (HCC)    Cataract    COPD (chronic obstructive pulmonary disease) (HCC)    Emphysema of lung (HCC)    Hyperlipidemia    Hypertension    Past Surgical History:  Procedure Laterality Date   CORONARY ARTERY BYPASS GRAFT     TONSILLECTOMY     Social  History:  reports that he quit smoking about 37 years ago. His smoking use included cigarettes. He started smoking about 57 years ago. He has a 60.00 pack-year smoking history. He has never used smokeless tobacco. He reports current alcohol use of about 5.0 standard drinks of alcohol per week. He reports that he does not use drugs.  Allergies  Allergen Reactions   Other Nausea And Vomiting and Other (See Comments)    Certain "stronger" pain medications make the patient's stomach hurt, also (must have an anti emetic to tolerate)    Family History  Problem Relation Age of Onset   Cancer Paternal Grandfather    Colon cancer Neg Hx     Prior to Admission medications   Medication Sig Start Date End Date Taking? Authorizing Provider  albuterol (VENTOLIN HFA) 108 (90 Base) MCG/ACT inhaler Inhale 2 puffs into the lungs every 6 (six) hours as needed for wheezing or shortness of breath.   Yes [provider]  aspirin 81 MG EC tablet Take 81 mg by mouth daily. Swallow whole.   Yes [provider]  DRAMAMINE-N 50 MG TABS Take 50 mg by mouth See admin instructions. Take 50 mg (1 tablet) by mouth with each dose of a stronger pain medication (to tolerate)   Yes [provider]  fluticasone (FLONASE) 50 MCG/ACT nasal spray Place 1-2 sprays into both nostrils daily as needed for allergies or rhinitis.   Yes [provider]  gabapentin (NEURONTIN) 100 MG capsule Take 100 mg by mouth at bedtime.   Yes [provider]  magnesium oxide (MAG-OX) 400 (240 Mg) MG tablet Take 400 mg by mouth daily.   Yes [provider]  montelukast (SINGULAIR) 10 MG tablet Take 10 mg by mouth at bedtime.   Yes [provider]  omeprazole (PRILOSEC) 20 MG capsule TAKE 1 CAPSULE BY MOUTH EVERY DAY Patient taking differently: Take 20 mg by mouth daily before breakfast. 01/08/21  Yes Ardith Dark, MD  oxyCODONE (ROXICODONE) 5 MG immediate release tablet Take 1 tablet (5  mg total) by mouth every 4 (four) hours as needed for up to 3 days for severe pain. Patient taking differently: Take 5 mg by mouth every 4 (four) hours as needed for severe pain (MUST TAKE AN ANTIEMETIC TO TOLERATE). 06/07/22 06/10/22 Yes Sage, Rolly Salter, PA-C  predniSONE (STERAPRED UNI-PAK 21 TAB) 5 MG (21) TBPK tablet Take 5-30 mg by mouth daily. Take 30 mg (6 tablets) by mouth WITH FOOD on day one, then decrease by 5 mg daily until finsihed   Yes [provider]  rosuvastatin (CRESTOR) 40 MG tablet Take 40 mg by mouth daily.   Yes [provider]  Tiotropium Bromide-Olodaterol (STIOLTO RESPIMAT) 2.5-2.5 MCG/ACT AERS Inhale 2 puffs into the lungs daily. 07/10/20  Yes Nyoka Cowden, MD  valsartan (DIOVAN) 80 MG tablet Take 1 tablet (80  mg total) by mouth daily. Patient taking differently: Take 80 mg by mouth at bedtime. 04/10/20  Yes Nyoka Cowden, MD  Cholecalciferol (VITAMIN D3) 50 MCG (2000 UT) TABS Take 4,000 Units by mouth daily.    [provider]  ezetimibe (ZETIA) 10 MG tablet Take 1 tablet (10 mg total) by mouth daily. Patient not taking: Reported on 06/10/2022 02/21/20   Jake Bathe, MD  rosuvastatin (CRESTOR) 20 MG tablet Take 1 tablet (20 mg total) by mouth daily. Patient not taking: Reported on 06/10/2022 02/21/20   Jake Bathe, MD  sertraline (ZOLOFT) 50 MG tablet Take 1 tablet (50 mg total) by mouth daily. Patient not taking: Reported on 06/10/2022 07/14/20   Ardith Dark, MD    Physical Exam: Vitals:   06/10/22 1301 06/10/22 1400 06/10/22 1620 06/10/22 1625  BP:  (!) 147/75 (!) 153/74   Pulse:  (!) 54 (!) 51   Resp:  16 15   Temp:    98 F (36.7 C)  TempSrc:    Oral  SpO2:  94% 95%   Weight: 77.6 kg     Height: 5\' 11"  (1.803 m)      Physical Exam Vitals and nursing note reviewed.  Constitutional:      Appearance: Normal appearance.     Comments: Appears comfortable and in no obvious distress.  Oriented to person and place  HENT:     Head:  Normocephalic and atraumatic.     Nose: Nose normal.     Mouth/Throat:     Mouth: Mucous membranes are dry.  Eyes:     Conjunctiva/sclera: Conjunctivae normal.  Cardiovascular:     Rate and Rhythm: Bradycardia present.     Heart sounds: Murmur heard.     Comments: Systolic ejection Pulmonary:     Effort: Pulmonary effort is normal.     Breath sounds: Normal breath sounds.  Abdominal:     General: Abdomen is flat. Bowel sounds are normal.     Palpations: Abdomen is soft.  Musculoskeletal:     Cervical back: Normal range of motion and neck supple.     Right lower leg: Edema present.     Left lower leg: Edema present.     Comments: Decreased range of motion right hip  Skin:    General: Skin is warm and dry.  Neurological:     Mental Status: He is alert.     Motor: Weakness present.  Psychiatric:        Mood and Affect: Mood normal.     Data Reviewed: Labs reviewed.  Sodium 127, potassium 4.5, chloride 98, bicarb 21, glucose 100, BUN 31, creatinine 1.54 with a baseline of 0.9, calcium 8.4, total protein 6.4, albumin 3.4, AST 19, ALT 15, alkaline phosphatase 45, total bilirubin 0.8, white count 9.8, hemoglobin 15.0, hematocrit 43.4, platelet count 137 There are no new results to review at this time.  Assessment and Plan: * Acute metabolic encephalopathy Thought to be medication induced Patient has been on opioids and steroids for pain control as well as gabapentin He is currently oriented to person and place but not to time Will discontinue prednisone and gabapentin for now Continue oxycodone as needed for pain  AKI (acute kidney injury) [facility]) Patient has a baseline serum creatinine of 0.93 and today on admission it is 1.54 with elevated BUN and hyponatremia suggestive of possible hypovolemia related to GI losses from nausea and vomiting Obtain total CK levels Obtain serum and urine osmolality as well  as urine sodium levels Hydrate patient with normal saline and repeat  sodium levels in a.m.  Hyponatremia Most likely hypovolemic hyponatremia Hydrate patient and repeat sodium levels in a.m.  Syncope, vasovagal Patient had a syncopal episode resulting in a fall and now presents with right hip pain concerning for an occult fracture Noted to be bradycardic with heart rate in the 50s and was said to have had a syncopal episode after a sneezing spell Will place patient on cardiac monitor to rule out arrhythmia Obtain 2D echocardiogram to assess LVEF and rule out aortic stenosis We will request cardiology consult Will likely benefit from an event monitor on discharge  Acute right hip pain Following a witnessed syncopal episode with a fall and patient landed on his right side and now presents with right hip pain concerning for an occult fracture Place patient on fall precautions Follow-up results of MRI of right hip and lumbar spine Pain control  Essential hypertension Continue losartan  Major depression in remission (HCC) Stable  GERD (gastroesophageal reflux disease) Continue PPI  CAD s/p CABGx4 in 2010 Hold aspirin until an acute fracture is ruled out Continue statins  Stage 1 mild COPD by GOLD classification (HCC) Not acutely exacerbated Continue as needed bronchodilator therapy      Advance Care Planning:   Code Status: Full Code   Consults: Cardiology, orthopedic surgery  Family Communication: Greater than 50% of time was spent discussing patient's condition and plan of care with him and his daughter-in-law, Diane at the bedside.  All questions and concerns have been addressed, they verbalized understanding and agree with the plan.  CODE STATUS was discussed and he wishes to be a full code.  He lists his sons [name] and Cristal Deer as his healthcare power of attorney.  Severity of Illness: The appropriate patient status for this patient is INPATIENT. Inpatient status is judged to be reasonable and necessary in order to provide the  required intensity of service to ensure the patient's safety. The patient's presenting symptoms, physical exam findings, and initial radiographic and laboratory data in the context of their chronic comorbidities is felt to place them at high risk for further clinical deterioration. Furthermore, it is not anticipated that the patient will be medically stable for discharge from the hospital within 2 midnights of admission.   * I certify that at the point of admission it is my clinical judgment that the patient will require inpatient hospital care spanning beyond 2 midnights from the point of admission due to high intensity of service, high risk for further deterioration and high frequency of surveillance required.*  Author: Lucile Shutters, MD 06/10/2022 6:05 PM  For on call review [email or web address].

## 2022-06-10 NOTE — Assessment & Plan Note (Signed)
Hold aspirin until an acute fracture is ruled out Continue statins

## 2022-06-10 NOTE — ED Notes (Signed)
Called lab to add urine osmo and sodium as well as serum osmo to specimens in the lab

## 2022-06-10 NOTE — Assessment & Plan Note (Signed)
Not acutely exacerbated ?Continue as needed bronchodilator therapy ?

## 2022-06-10 NOTE — Assessment & Plan Note (Signed)
Thought to be medication induced Patient has been on opioids and steroids for pain control as well as gabapentin He is currently oriented to person and place but not to time Will discontinue prednisone and gabapentin for now Continue oxycodone as needed for pain

## 2022-06-10 NOTE — Assessment & Plan Note (Signed)
Patient had a syncopal episode resulting in a fall and now presents with right hip pain concerning for an occult fracture Noted to be bradycardic with heart rate in the 50s and was said to have had a syncopal episode after a sneezing spell Will place patient on cardiac monitor to rule out arrhythmia Obtain 2D echocardiogram to assess LVEF and rule out aortic stenosis We will request cardiology consult Will likely benefit from an event monitor on discharge

## 2022-06-10 NOTE — ED Provider Notes (Signed)
[place] EMERGENCY DEPARTMENT AT [facility] Provider Note   CSN: [id number] Arrival date & time: 06/10/22  1226     History  Chief Complaint  Patient presents with   Leg Pain   Fatigue    Dave Silva. is a 82 y.o. male.   Leg Pain  Patient presents with right leg pain fall and weakness.  Had a fall while in [place].  Reportedly had an x-ray that showed a fracture.  Then followed up in the ER here.  X-ray and CT did not show fracture.  Started on pain medicines.  Followed up with Ortho today.  Took a pain pill after the visit.  Had outpatient MRI scheduled.  However since then has been more confused.  More sleepy.  Has had 2 more falls and states legs gave out.  Did not hit head.  Potentially has some dementia, per family, however has not been diagnosed.   Past Medical History:  Diagnosis Date   Asthma    Cancer (HCC)    Cataract    COPD (chronic obstructive pulmonary disease) (HCC)    Emphysema of lung (HCC)    Hyperlipidemia    Hypertension     Home Medications Prior to Admission medications   Medication Sig Start Date End Date Taking? Authorizing Provider  albuterol (VENTOLIN HFA) 108 (90 Base) MCG/ACT inhaler Inhale 2 puffs into the lungs every 6 (six) hours as needed for wheezing or shortness of breath.   Yes [provider]  aspirin 81 MG EC tablet Take 81 mg by mouth daily. Swallow whole.   Yes [provider]  DRAMAMINE-N 50 MG TABS Take 50 mg by mouth See admin instructions. Take 50 mg (1 tablet) by mouth with each dose of a stronger pain medication (to tolerate)   Yes [provider]  fluticasone (FLONASE) 50 MCG/ACT nasal spray Place 1-2 sprays into both nostrils daily as needed for allergies or rhinitis.   Yes [provider]  gabapentin (NEURONTIN) 100 MG capsule Take 100 mg by mouth at bedtime.   Yes [provider]  magnesium oxide (MAG-OX) 400 (240 Mg) MG tablet Take 400 mg by mouth daily.    Yes [provider]  montelukast (SINGULAIR) 10 MG tablet Take 10 mg by mouth at bedtime.   Yes [provider]  omeprazole (PRILOSEC) 20 MG capsule TAKE 1 CAPSULE BY MOUTH EVERY DAY Patient taking differently: Take 20 mg by mouth daily before breakfast. 01/08/21  Yes Ardith Dark, MD  oxyCODONE (ROXICODONE) 5 MG immediate release tablet Take 1 tablet (5 mg total) by mouth every 4 (four) hours as needed for up to 3 days for severe pain. Patient taking differently: Take 5 mg by mouth every 4 (four) hours as needed for severe pain (MUST TAKE AN ANTIEMETIC TO TOLERATE). 06/07/22 06/10/22 Yes Sage, Rolly Salter, PA-C  predniSONE (STERAPRED UNI-PAK 21 TAB) 5 MG (21) TBPK tablet Take 5-30 mg by mouth daily. Take 30 mg (6 tablets) by mouth WITH FOOD on day one, then decrease by 5 mg daily until finsihed   Yes [provider]  rosuvastatin (CRESTOR) 40 MG tablet Take 40 mg by mouth daily.   Yes [provider]  Tiotropium Bromide-Olodaterol (STIOLTO RESPIMAT) 2.5-2.5 MCG/ACT AERS Inhale 2 puffs into the lungs daily. 07/10/20  Yes Nyoka Cowden, MD  valsartan (DIOVAN) 80 MG tablet Take 1 tablet (80 mg total) by mouth daily. Patient taking differently: Take 80 mg by mouth at bedtime. 04/10/20  Yes Nyoka Cowden, MD  Cholecalciferol (VITAMIN D3) 50 MCG (2000 UT) TABS Take 4,000 Units by mouth daily.    [provider]  ezetimibe (ZETIA) 10 MG tablet Take 1 tablet (10 mg total) by mouth daily. Patient not taking: Reported on 06/10/2022 02/21/20   Jake Bathe, MD  rosuvastatin (CRESTOR) 20 MG tablet Take 1 tablet (20 mg total) by mouth daily. Patient not taking: Reported on 06/10/2022 02/21/20   Jake Bathe, MD  sertraline (ZOLOFT) 50 MG tablet Take 1 tablet (50 mg total) by mouth daily. Patient not taking: Reported on 06/10/2022 07/14/20   Ardith Dark, MD      Allergies    Other    Review of Systems   Review of Systems  Physical Exam Updated Vital Signs BP  (!) 153/74   Pulse (!) 51   Temp 98 F (36.7 C) (Oral)   Resp 15   Ht 5\' 11"  (1.803 m)   Wt 77.6 kg   SpO2 95%   BMI 23.85 kg/m  Physical Exam Vitals and nursing note reviewed.  Eyes:     Pupils: Pupils are equal, round, and reactive to light.  Abdominal:     Tenderness: There is no abdominal tenderness.  Musculoskeletal:        General: Tenderness present.     Cervical back: Neck supple. No tenderness.     Comments: Tenderness to right hip laterally.  Neurological:     Mental Status: He is alert.     ED Results / Procedures / Treatments   Labs (all labs ordered are listed, but only abnormal results are displayed) Labs Reviewed  URINALYSIS, ROUTINE W REFLEX MICROSCOPIC - Abnormal; Notable for the following components:      Result Value   Color, Urine STRAW (*)    Hgb urine dipstick SMALL (*)    Bacteria, UA RARE (*)    All other components within normal limits  COMPREHENSIVE METABOLIC PANEL - Abnormal; Notable for the following components:   Sodium 127 (*)    CO2 21 (*)    Glucose, Bld 100 (*)    BUN 31 (*)    Creatinine, Ser 1.54 (*)    Calcium 8.4 (*)    Total Protein 6.4 (*)    Albumin 3.4 (*)    GFR, Estimated 45 (*)    All other components within normal limits  CBC - Abnormal; Notable for the following components:   MCV 101.2 (*)    MCH 35.0 (*)    Platelets 137 (*)    All other components within normal limits  OSMOLALITY, URINE  OSMOLALITY  SODIUM, URINE, RANDOM    EKG EKG Interpretation  Date/Time:  Monday June 10 2022 12:40:46 EDT Ventricular Rate:  60 PR Interval:  164 QRS Duration: 113 QT Interval:  448 QTC Calculation: 448 R Axis:   104 Text Interpretation: Sinus rhythm Incomplete right bundle branch block Low voltage, precordial leads Confirmed by Benjiman Core [phone]) on 06/10/2022 1:01:36 PM  Radiology CT HEAD WO CONTRAST ( )  Result Date: 06/10/2022 CLINICAL DATA:  Trauma, fall EXAM: CT HEAD WITHOUT CONTRAST TECHNIQUE:  Contiguous axial images were obtained from the base of the skull through the vertex without intravenous contrast. RADIATION DOSE REDUCTION: This exam was performed according to the departmental dose-optimization program which includes automated exposure control, adjustment of the mA and/or kV according to patient size and/or use of iterative reconstruction technique. COMPARISON:  None Available. FINDINGS: Brain: No acute intracranial findings are seen.  There are no signs of bleeding within the cranium. There is no focal mass effect. Cortical sulci are prominent. There is decreased density in periventricular white matter. Scattered arterial calcifications are seen. Vascular: Scattered arterial calcifications are seen. Skull: No acute findings are seen. Sinuses/Orbits: There is mucosal thickening and possible small air-fluid level in right maxillary sinus. Other: None. IMPRESSION: No acute intracranial findings are seen. Atrophy. 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 Electronically Signed   By: Ernie Avena M.D.   On: 06/10/2022 15:11   DG Chest Portable 1 View  Result Date: 06/10/2022 CLINICAL DATA:  Weakness EXAM: PORTABLE CHEST 1 VIEW COMPARISON:  04/10/2020 FINDINGS: Sternal wires. Normal cardiopericardial silhouette. No pneumothorax or effusion. There is a density along the right side of the mediastinum inferiorly consistent with known history of a hiatal hernia. No edema or consolidation. Overlapping cardiac leads. IMPRESSION: Postop chest.  Hiatal hernia Electronically Signed   By: Karen Kays M.D.   On: 06/10/2022 13:58    Procedures Procedures    Medications Ordered in ED Medications  lactated ringers bolus 500 mL (500 mLs Intravenous New Bag/Given 06/10/22 1621)    ED Course/ Medical Decision Making/ A&P                             Medical Decision Making Amount and/or Complexity of Data Reviewed Labs: ordered. Radiology: ordered.  Risk Decision  regarding hospitalization.   Patient with generalized weakness confusion.  Worse today.  Has been on pain medicines due to right hip pain.  Saw orthopedic surgery and worsen.  Has had falls.  Scheduled for MRI.  However has had more confusion.  Urine does not show infection, however worsening kidney function with creatinine now up to 1.5 from 1.3.  Reviewed recent lab work from outside hospital.  Also has hyponatremia.  Will give fluid bolus.  However patient did not ambulate due to weakness and pain.  I think will benefit from mission to the hospital for adjustment of medications, rehydration, and MRI.  Will discuss with hospitalist for admission         Final Clinical Impression(s) / ED Diagnoses Final diagnoses:  Dehydration  Pain of right hip    Rx / DC Orders ED Discharge Orders     None         Benjiman Core, MD 06/10/22 [phone]

## 2022-06-10 NOTE — Assessment & Plan Note (Signed)
Most likely hypovolemic hyponatremia Hydrate patient and repeat sodium levels in a.m.

## 2022-06-10 NOTE — Assessment & Plan Note (Signed)
Following a witnessed syncopal episode with a fall and patient landed on his right side and now presents with right hip pain concerning for an occult fracture Place patient on fall precautions Follow-up results of MRI of right hip and lumbar spine Pain control

## 2022-06-10 NOTE — ED Triage Notes (Signed)
Pt returns today after going to the orthopedist this morning for follow up on his right leg pain. Pt left the office, took 5 of oxy, then took a nap. Upon waking, pt got up to use restroom and fell twice. Pt remains lethargic throughout triage. Pt does have MRI of leg scheduled for Thursday, but daughter does not feel pt can wait.

## 2022-06-10 NOTE — Assessment & Plan Note (Signed)
Patient has a baseline serum creatinine of 0.93 and today on admission it is 1.54 with elevated BUN and hyponatremia suggestive of possible hypovolemia related to GI losses from nausea and vomiting Obtain total CK levels Obtain serum and urine osmolality as well as urine sodium levels Hydrate patient with normal saline and repeat sodium levels in a.m.

## 2022-06-10 NOTE — Assessment & Plan Note (Signed)
Continue PPI ?

## 2022-06-10 NOTE — ED Notes (Signed)
ED TO INPATIENT HANDOFF REPORT  ED Nurse Name and Phone #: [phone]  S Name/Age/Gender Dave Silva. 82 y.o. male Room/Bed: WA10/WA10  Code Status   Code Status: Not on file  Home/SNF/Other Home Patient oriented to: self and place Is this baseline? No   Triage Complete: Triage complete  Chief Complaint Acute metabolic encephalopathy [G93.41]  Triage Note Pt returns today after going to the orthopedist this morning for follow up on his right leg pain. Pt left the office, took 5 of oxy, then took a nap. Upon waking, pt got up to use restroom and fell twice. Pt remains lethargic throughout triage. Pt does have MRI of leg scheduled for Thursday, but daughter does not feel pt can wait.    Allergies Allergies  Allergen Reactions   Other Nausea And Vomiting and Other (See Comments)    Certain "stronger" pain medications make the patient's stomach hurt, also (must have an anti emetic to tolerate)    Level of Care/Admitting Diagnosis ED Disposition     ED Disposition  Admit   Condition  --   Comment  Hospital Area: [facility] [[id number]]  Level of Care: Telemetry [5]  Admit to tele based on following criteria: Other see comments  Comments: Electrolyte abnormalities  May admit patient to [facility] or [facility] if equivalent level of care is available:: Yes  Covid Evaluation: Asymptomatic - no recent exposure (last 10 days) testing not required  Diagnosis: Acute metabolic encephalopathy [[id number]]  Admitting Physician: Lonia Mad  Attending Physician: Lonia Mad  Certification:: I certify this patient will need inpatient services for at least 2 midnights  Estimated Length of Stay: 3          B Medical/Surgery History Past Medical History:  Diagnosis Date   Asthma    Cancer (HCC)    Cataract    COPD (chronic obstructive pulmonary disease) (HCC)    Emphysema of lung (HCC)    Hyperlipidemia    Hypertension     Past Surgical History:  Procedure Laterality Date   CORONARY ARTERY BYPASS GRAFT     TONSILLECTOMY       A IV Location/Drains/Wounds Patient Lines/Drains/Airways Status     Active Line/Drains/Airways     Name Placement date Placement time Site Days   Peripheral IV 06/10/22 20 G Left;Posterior Forearm 06/10/22  1317  Forearm  less than 1            Intake/Output Last 24 hours No intake or output data in the 24 hours ending 06/10/22 1629  Labs/Imaging Results for orders placed or performed during the hospital encounter of 06/10/22 (from the past 48 hour(s))  Urinalysis, Routine w reflex microscopic -Urine, Unspecified Source     Status: Abnormal   Collection Time: 06/10/22  1:12 PM  Result Value Ref Range   Color, Urine STRAW (A) YELLOW   APPearance CLEAR CLEAR   Specific Gravity, Urine 1.006 1.005 - 1.030   pH 6.0 5.0 - 8.0   Glucose, UA NEGATIVE NEGATIVE mg/dL   Hgb urine dipstick SMALL (A) NEGATIVE   Bilirubin Urine NEGATIVE NEGATIVE   Ketones, ur NEGATIVE NEGATIVE mg/dL   Protein, ur NEGATIVE NEGATIVE mg/dL   Nitrite NEGATIVE NEGATIVE   Leukocytes,Ua NEGATIVE NEGATIVE   RBC / HPF 0-5 0 - 5 RBC/hpf   WBC, UA 0-5 0 - 5 WBC/hpf   Bacteria, UA RARE (A) NONE SEEN   Squamous Epithelial / HPF 0-5 0 - 5 /HPF  Comment: Performed at [facility], [address], [place], [place] [address]  Comprehensive metabolic panel     Status: Abnormal   Collection Time: 06/10/22  1:12 PM  Result Value Ref Range   Sodium 127 (L) 135 - 145 mmol/L   Potassium 4.5 3.5 - 5.1 mmol/L   Chloride 98 98 - 111 mmol/L   CO2 21 (L) 22 - 32 mmol/L   Glucose, Bld 100 (H) 70 - 99 mg/dL    Comment: Glucose reference range applies only to samples taken after fasting for at least 8 hours.   BUN 31 (H) 8 - 23 mg/dL   Creatinine, Ser 4.78 (H) 0.61 - 1.24 mg/dL   Calcium 8.4 (L) 8.9 - 10.3 mg/dL   Total Protein 6.4 (L) 6.5 - 8.1 g/dL   Albumin 3.4 (L) 3.5 - 5.0 g/dL   AST 19  15 - 41 U/L   ALT 15 0 - 44 U/L   Alkaline Phosphatase 45 38 - 126 U/L   Total Bilirubin 0.8 0.3 - 1.2 mg/dL   GFR, Estimated 45 (L) >60 mL/min    Comment: (NOTE) Calculated using the CKD-EPI Creatinine Equation (2021)    Anion gap 8 5 - 15    Comment: Performed at [facility], [address] [place], [place] [address]  CBC     Status: Abnormal   Collection Time: 06/10/22  1:12 PM  Result Value Ref Range   WBC 9.8 4.0 - 10.5 K/uL   RBC 4.29 4.22 - 5.81 MIL/uL   Hemoglobin 15.0 13.0 - 17.0 g/dL   HCT 13.0 86.5 - 78.4 %   MCV 101.2 (H) 80.0 - 100.0 fL   MCH 35.0 (H) 26.0 - 34.0 pg   MCHC 34.6 30.0 - 36.0 g/dL   RDW 69.6 29.5 - 28.4 %   Platelets 137 (L) 150 - 400 K/uL   nRBC 0.0 0.0 - 0.2 %    Comment: Performed at [facility], [address], [place], [place] [address]   CT HEAD WO CONTRAST ( )  Result Date: 06/10/2022 CLINICAL DATA:  Trauma, fall EXAM: CT HEAD WITHOUT CONTRAST TECHNIQUE: Contiguous axial images were obtained from the base of the skull through the vertex without intravenous contrast. RADIATION DOSE REDUCTION: This exam was performed according to the departmental dose-optimization program which includes automated exposure control, adjustment of the mA and/or kV according to patient size and/or use of iterative reconstruction technique. COMPARISON:  None Available. FINDINGS: Brain: No acute intracranial findings are seen. There are no signs of bleeding within the cranium. There is no focal mass effect. Cortical sulci are prominent. There is decreased density in periventricular white matter. Scattered arterial calcifications are seen. Vascular: Scattered arterial calcifications are seen. Skull: No acute findings are seen. Sinuses/Orbits: There is mucosal thickening and possible small air-fluid level in right maxillary sinus. Other: None. IMPRESSION: No acute intracranial findings are seen. Atrophy. Small vessel disease. Acute/chronic  right maxillary sinusitis. Electronically Signed   By: Ernie Avena M.D.   On: 06/10/2022 15:11   DG Chest Portable 1 View  Result Date: 06/10/2022 CLINICAL DATA:  Weakness EXAM: PORTABLE CHEST 1 VIEW COMPARISON:  04/10/2020 FINDINGS: Sternal wires. Normal cardiopericardial silhouette. No pneumothorax or effusion. There is a density along the right side of the mediastinum inferiorly consistent with known history of a hiatal hernia. No edema or consolidation. Overlapping cardiac leads. IMPRESSION: Postop chest.  Hiatal hernia Electronically Signed   By: Piedad Climes.D.  On: 06/10/2022 13:58    Pending Labs Unresulted Labs (From admission, onward)     Start     Ordered   06/10/22 1622  Osmolality, urine  Once,   R        06/10/22 1621   06/10/22 1622  Osmolality  Once,   R        06/10/22 1621   06/10/22 1622  Sodium, urine, random  Once,   R        06/10/22 1621            Vitals/Pain Today's Vitals   06/10/22 1301 06/10/22 1400 06/10/22 1620 06/10/22 1625  BP:  (!) 147/75 (!) 153/74   Pulse:  (!) 54 (!) 51   Resp:  16 15   Temp:    98 F (36.7 C)  TempSrc:    Oral  SpO2:  94% 95%   Weight: 171 lb (77.6 kg)     Height: 5\' 11"  (1.803 m)     PainSc:        Isolation Precautions No active isolations  Medications Medications  lactated ringers bolus 500 mL (500 mLs Intravenous New Bag/Given 06/10/22 1621)    Mobility non-ambulatory     Focused Assessments Right lower back hip pain, multiple falls usually walks with a walker but since fall this morning unable to walk.    R Recommendations: See Admitting Provider Note  Report given to:   Additional Notes: [id number]

## 2022-06-11 ENCOUNTER — Encounter (HOSPITAL_COMMUNITY): Payer: Self-pay | Admitting: Internal Medicine

## 2022-06-11 ENCOUNTER — Inpatient Hospital Stay (HOSPITAL_COMMUNITY): Payer: Medicare Other | Admitting: Certified Registered"

## 2022-06-11 ENCOUNTER — Inpatient Hospital Stay (HOSPITAL_COMMUNITY): Payer: Medicare Other

## 2022-06-11 ENCOUNTER — Encounter (HOSPITAL_COMMUNITY)
Admission: EM | Disposition: A | Discharge status: Home / Self Care | Payer: Self-pay | Source: Home / Self Care | Attending: Family Medicine

## 2022-06-11 DIAGNOSIS — I2510 Atherosclerotic heart disease of native coronary artery without angina pectoris: Secondary | ICD-10-CM

## 2022-06-11 DIAGNOSIS — G9341 Metabolic encephalopathy: Secondary | ICD-10-CM | POA: Diagnosis not present

## 2022-06-11 DIAGNOSIS — R55 Syncope and collapse: Secondary | ICD-10-CM | POA: Diagnosis not present

## 2022-06-11 DIAGNOSIS — Z951 Presence of aortocoronary bypass graft: Secondary | ICD-10-CM

## 2022-06-11 DIAGNOSIS — N320 Bladder-neck obstruction: Secondary | ICD-10-CM

## 2022-06-11 DIAGNOSIS — Z87891 Personal history of nicotine dependence: Secondary | ICD-10-CM

## 2022-06-11 DIAGNOSIS — I10 Essential (primary) hypertension: Secondary | ICD-10-CM

## 2022-06-11 HISTORY — PX: CYSTOSCOPY WITH URETHRAL DILATATION: SHX5125

## 2022-06-11 LAB — CBC
HCT: 43.8 % (ref 39.0–52.0)
Hemoglobin: 14.6 g/dL (ref 13.0–17.0)
MCH: 34.3 pg — ABNORMAL HIGH (ref 26.0–34.0)
MCHC: 33.3 g/dL (ref 30.0–36.0)
MCV: 102.8 fL — ABNORMAL HIGH (ref 80.0–100.0)
Platelets: 136 10*3/uL — ABNORMAL LOW (ref 150–400)
RBC: 4.26 MIL/uL (ref 4.22–5.81)
RDW: 13.3 % (ref 11.5–15.5)
WBC: 8.9 10*3/uL (ref 4.0–10.5)
nRBC: 0 % (ref 0.0–0.2)

## 2022-06-11 LAB — ECHOCARDIOGRAM COMPLETE
AR max vel: 0.83 cm2
AV Area VTI: 0.79 cm2
AV Area mean vel: 0.76 cm2
AV Mean grad: 16 mmHg
AV Peak grad: 24.3 mmHg
Ao pk vel: 2.46 m/s
Area-P 1/2: 3.39 cm2
Calc EF: 58.9 %
Height: 71 in
P 1/2 time: 400 msec
S' Lateral: 2.9 cm
Single Plane A2C EF: 56.6 %
Single Plane A4C EF: 61.9 %
Weight: 2736 oz

## 2022-06-11 LAB — BASIC METABOLIC PANEL
Anion gap: 6 (ref 5–15)
BUN: 33 mg/dL — ABNORMAL HIGH (ref 8–23)
CO2: 25 mmol/L (ref 22–32)
Calcium: 8 mg/dL — ABNORMAL LOW (ref 8.9–10.3)
Chloride: 102 mmol/L (ref 98–111)
Creatinine, Ser: 1.73 mg/dL — ABNORMAL HIGH (ref 0.61–1.24)
GFR, Estimated: 39 mL/min — ABNORMAL LOW (ref >60)
Glucose, Bld: 100 mg/dL — ABNORMAL HIGH (ref 70–99)
Potassium: 4 mmol/L (ref 3.5–5.1)
Sodium: 133 mmol/L — ABNORMAL LOW (ref 135–145)

## 2022-06-11 LAB — OSMOLALITY, URINE: Osmolality, Ur: 238 mOsm/kg — ABNORMAL LOW (ref 300–900)

## 2022-06-11 SURGERY — CYSTOSCOPY, WITH URETHRAL DILATION
Anesthesia: General

## 2022-06-11 MED ORDER — SODIUM CHLORIDE 0.9 % IV SOLN: INTRAVENOUS | Status: AC

## 2022-06-11 MED ORDER — OXYCODONE HCL 5 MG PO TABS: 5.0000 mg | ORAL_TABLET | Freq: Once | ORAL | Status: DC | PRN

## 2022-06-11 MED ORDER — PREDNISONE 20 MG PO TABS
30.0000 mg | ORAL_TABLET | Freq: Every day | ORAL | Status: AC
  Administered 2022-06-12: 30 mg via ORAL
  Filled 2022-06-11: qty 1

## 2022-06-11 MED ORDER — FENTANYL CITRATE (PF) 100 MCG/2ML IJ SOLN
INTRAMUSCULAR | Status: AC
  Filled 2022-06-11: qty 2

## 2022-06-11 MED ORDER — PHENYLEPHRINE HCL (PRESSORS) 10 MG/ML IV SOLN
INTRAVENOUS | Status: AC
  Filled 2022-06-11: qty 1

## 2022-06-11 MED ORDER — LIDOCAINE HCL (PF) 2 % IJ SOLN
INTRAMUSCULAR | Status: AC
  Filled 2022-06-11: qty 5

## 2022-06-11 MED ORDER — ASPIRIN 81 MG PO TBEC
81.0000 mg | DELAYED_RELEASE_TABLET | Freq: Every day | ORAL | Status: DC
  Administered 2022-06-12 – 2022-06-15 (×4): 81 mg via ORAL
  Filled 2022-06-11 (×4): qty 1

## 2022-06-11 MED ORDER — ONDANSETRON HCL 4 MG/2ML IJ SOLN
INTRAMUSCULAR | Status: DC | PRN
  Administered 2022-06-11: 4 mg via INTRAVENOUS

## 2022-06-11 MED ORDER — ONDANSETRON HCL 4 MG/2ML IJ SOLN
INTRAMUSCULAR | Status: AC
  Filled 2022-06-11: qty 2

## 2022-06-11 MED ORDER — FENTANYL CITRATE PF 50 MCG/ML IJ SOSY: 25.0000 ug | PREFILLED_SYRINGE | INTRAMUSCULAR | Status: DC | PRN

## 2022-06-11 MED ORDER — CIPROFLOXACIN IN D5W 400 MG/200ML IV SOLN
INTRAVENOUS | Status: AC
  Filled 2022-06-11: qty 200

## 2022-06-11 MED ORDER — LIDOCAINE HCL URETHRAL/MUCOSAL 2 % EX GEL
CUTANEOUS | Status: AC
  Filled 2022-06-11: qty 30

## 2022-06-11 MED ORDER — PREDNISONE 10 MG PO TABS
10.0000 mg | ORAL_TABLET | Freq: Every day | ORAL | Status: AC
  Administered 2022-06-14: 10 mg via ORAL
  Filled 2022-06-11: qty 1

## 2022-06-11 MED ORDER — PROPOFOL 10 MG/ML IV BOLUS
INTRAVENOUS | Status: AC
  Filled 2022-06-11: qty 20

## 2022-06-11 MED ORDER — ONDANSETRON HCL 4 MG/2ML IJ SOLN: 4.0000 mg | Freq: Four times a day (QID) | INTRAMUSCULAR | Status: DC | PRN

## 2022-06-11 MED ORDER — FENTANYL CITRATE (PF) 100 MCG/2ML IJ SOLN
INTRAMUSCULAR | Status: DC | PRN
  Administered 2022-06-11: 25 ug via INTRAVENOUS

## 2022-06-11 MED ORDER — LIDOCAINE 2% (20 MG/ML) 5 ML SYRINGE
INTRAMUSCULAR | Status: DC | PRN
  Administered 2022-06-11: 60 mg via INTRAVENOUS

## 2022-06-11 MED ORDER — PHENYLEPHRINE HCL-NACL 20-0.9 MG/250ML-% IV SOLN
INTRAVENOUS | Status: DC | PRN
  Administered 2022-06-11: 50 ug/min via INTRAVENOUS

## 2022-06-11 MED ORDER — STERILE WATER FOR IRRIGATION IR SOLN
Status: DC | PRN
  Administered 2022-06-11: 3000 mL

## 2022-06-11 MED ORDER — CIPROFLOXACIN IN D5W 400 MG/200ML IV SOLN
INTRAVENOUS | Status: DC | PRN
  Administered 2022-06-11: 400 mg via INTRAVENOUS

## 2022-06-11 MED ORDER — OXYCODONE HCL 5 MG/5ML PO SOLN: 5.0000 mg | Freq: Once | ORAL | Status: DC | PRN

## 2022-06-11 MED ORDER — PREDNISONE 20 MG PO TABS: 40.0000 mg | ORAL_TABLET | Freq: Every day | ORAL | Status: DC

## 2022-06-11 MED ORDER — PREDNISONE 20 MG PO TABS: 40.0000 mg | ORAL_TABLET | Freq: Every day | ORAL | Status: AC

## 2022-06-11 MED ORDER — PREDNISONE 20 MG PO TABS
20.0000 mg | ORAL_TABLET | Freq: Every day | ORAL | Status: AC
  Administered 2022-06-13: 20 mg via ORAL
  Filled 2022-06-11: qty 1

## 2022-06-11 MED ORDER — PHENYLEPHRINE 80 MCG/ML (10ML) SYRINGE FOR IV PUSH (FOR BLOOD PRESSURE SUPPORT)
PREFILLED_SYRINGE | INTRAVENOUS | Status: AC
  Filled 2022-06-11: qty 10

## 2022-06-11 MED ORDER — PROPOFOL 10 MG/ML IV BOLUS
INTRAVENOUS | Status: DC | PRN
  Administered 2022-06-11: 100 mg via INTRAVENOUS

## 2022-06-11 SURGICAL SUPPLY — 25 items
BAG URINE DRAIN 2000ML AR STRL (UROLOGICAL SUPPLIES) ×1 IMPLANT
BALLN NEPHROSTOMY (BALLOONS)
BALLN OPTILUME DCB 30X5X75 (BALLOONS) ×1
BALLOON NEPHROSTOMY (BALLOONS) IMPLANT
BALLOON OPTILUME DCB 30X5X75 (BALLOONS) IMPLANT
CATH FOLEY 2W COUNCIL 20FR 5CC (CATHETERS) IMPLANT
CATH ROBINSON RED A/P 14FR (CATHETERS) ×1 IMPLANT
CATH URET 5FR 28IN CONE TIP (BALLOONS)
CATH URET 5FR 70CM CONE TIP (BALLOONS) IMPLANT
CATH URETL OPEN END 6FR 70 (CATHETERS) IMPLANT
CLOTH BEACON ORANGE TIMEOUT ST (SAFETY) ×1 IMPLANT
DEVICE INFLATION ATRION QL4015 (MISCELLANEOUS) IMPLANT
GLOVE BIO SURGEON STRL SZ7.5 (GLOVE) ×1 IMPLANT
GOWN STRL REUS W/ TWL LRG LVL3 (GOWN DISPOSABLE) ×2 IMPLANT
GOWN STRL REUS W/ TWL XL LVL3 (GOWN DISPOSABLE) ×1 IMPLANT
GOWN STRL REUS W/TWL LRG LVL3 (GOWN DISPOSABLE) ×2
GOWN STRL REUS W/TWL XL LVL3 (GOWN DISPOSABLE) ×1
GUIDEWIRE ANG ZIPWIRE 038X150 (WIRE) IMPLANT
GUIDEWIRE STR DUAL SENSOR (WIRE) ×1 IMPLANT
KIT TURNOVER KIT A (KITS) IMPLANT
MANIFOLD NEPTUNE II (INSTRUMENTS) IMPLANT
NS IRRIG 1000ML POUR BTL (IV SOLUTION) IMPLANT
PACK CYSTO (CUSTOM PROCEDURE TRAY) ×1 IMPLANT
PENCIL SMOKE EVACUATOR (MISCELLANEOUS) IMPLANT
WATER STERILE IRR 3000ML UROMA (IV SOLUTION) ×1 IMPLANT

## 2022-06-11 NOTE — Anesthesia Preprocedure Evaluation (Signed)
Anesthesia Evaluation  Patient identified by MRN, date of birth, ID band Patient awake    Reviewed: Allergy & Precautions, H&P , NPO status , Patient's Chart, lab work & pertinent test results  Airway Mallampati: II   Neck ROM: full    Dental   Pulmonary asthma , COPD, former smoker   breath sounds clear to auscultation       Cardiovascular hypertension, + CAD and + CABG   Rhythm:regular Rate:Normal  TTE (06/11/22): EF 55%, moderate AS with mean PG 16 mmHg.   Neuro/Psych  PSYCHIATRIC DISORDERS  Depression       GI/Hepatic ,GERD  ,,  Endo/Other    Renal/GU      Musculoskeletal   Abdominal   Peds  Hematology   Anesthesia Other Findings   Reproductive/Obstetrics                             Anesthesia Physical Anesthesia Plan  ASA: 3  Anesthesia Plan: General   Post-op Pain Management:    Induction: Intravenous  PONV Risk Score and Plan: 2 and Ondansetron, Dexamethasone and Treatment may vary due to age or medical condition  Airway Management Planned: LMA  Additional Equipment:   Intra-op Plan:   Post-operative Plan: Extubation in OR  Informed Consent: I have reviewed the patients History and Physical, chart, labs and discussed the procedure including the risks, benefits and alternatives for the proposed anesthesia with the patient or authorized representative who has indicated his/her understanding and acceptance.     Dental advisory given  Plan Discussed with: CRNA, Anesthesiologist and Surgeon  Anesthesia Plan Comments:        Anesthesia Quick Evaluation

## 2022-06-11 NOTE — Consult Note (Addendum)
Cardiology Consultation   Patient ID: Dave Silva. MRN: [id number]; DOB: 1940-03-11  Admit date: 06/10/2022 Date of Consult: 06/11/2022  PCP:  Patient, No Pcp Per    HeartCare Providers Cardiologist:   [facility]ist in [place]   Patient Profile:   Dave Silva. is a 82 y.o. male with a hx of CABG x 4 in 2010, PVCs, COPD, GERD, prostate cancer, hypertension, hyperlipidemia, mild aortic stenosis, carotid artery stenosis who is being seen 06/11/2022 for the evaluation of syncope at the request of Dr. Jarvis Newcomer.  History of Present Illness:   Dave Silva was seen briefly by Dr. Anne Fu for the management of his CAD status post CABGx4 in 2010 which has been stable.  Records show he was last seen by a cardiology group in [place] to establish care.  He was being managed for his PVCs and was placed on a 3-day cardiac monitor that showed normal sinus rhythm with short runs of SVT and mild ectopy.  The results are the following: 805 SVE's with 30 couplets and 27 atrial runs, longest 11 beats rate 119 and fastest 3 beats rate 164. There were 4,150 PVCs with 161 couplets. Short runs of trigeminy.  An echo was also performed in February 2024 that showed an LVEF of 58%, moderate aortic valve calcification with mild AS with a mean gradient of 13.  He was also supplemented with magnesium for his ectopy.  Patient was recently admitted on 06/07/2022 for complaints of a fall.  Patient sustained a fall in [place] on 05/29/2022 while at a restaurant.  He had a sneezing fit and had a witnessed episode of syncope.  CT did not indicate any fracture.  He was discharged home to outpatient services.  Now, patient is reporting significant weakness after walking to the bathroom.  Patient feels that this might be more attributed to pain, but he reports falling to his knees and unable to stand back up.  He did not lose consciousness or hit his head.  During his previous episode of syncope  he had no residual weakness.  Per chart review patient's daughter reports he's been having episodes of nausea and vomiting and oral intake has been poor. When reviewing lab work it appears that patient might be slightly dehydrated with due to an elevated creatinine of 1.54, previously normal.  EKG showing normal sinus rhythm.  Normal glucose levels.  Sodium 127-133. CT head negative.  Patient has also been bradycardic throughout this admission with heart rates in the 50s to 60s  Past Medical History:  Diagnosis Date   Asthma    Cancer (HCC)    Cataract    COPD (chronic obstructive pulmonary disease) (HCC)    Emphysema of lung (HCC)    Hyperlipidemia    Hypertension     Past Surgical History:  Procedure Laterality Date   CORONARY ARTERY BYPASS GRAFT     TONSILLECTOMY      Inpatient Medications: Scheduled Meds:  arformoterol  15 mcg Nebulization BID   And   umeclidinium bromide  1 puff Inhalation Daily   cholecalciferol  4,000 Units Oral Daily   irbesartan  75 mg Oral Daily   magnesium oxide  400 mg Oral Daily   montelukast  10 mg Oral QHS   pantoprazole  40 mg Oral Daily   rosuvastatin  40 mg Oral Daily   Continuous Infusions:  sodium chloride     PRN Meds: albuterol, fluticasone, ondansetron **OR** ondansetron (ZOFRAN) IV, oxyCODONE  Allergies:  Allergies  Allergen Reactions   Other Nausea And Vomiting and Other (See Comments)    Certain "stronger" pain medications make the patient's stomach hurt, also (must have an anti emetic to tolerate)    Social History:   Social History   Socioeconomic History   Marital status: Widowed    Spouse name: Not on file   Number of children: Not on file   Years of education: Not on file   Highest education level: Not on file  Occupational History   Not on file  Tobacco Use   Smoking status: Former    Packs/day: 3.00    Years: 20.00    Additional pack years: 0.00    Total pack years: 60.00    Types: Cigarettes    Start  date: 04/10/1965    Quit date: 02/11/1985    Years since quitting: 37.3   Smokeless tobacco: Never  Substance and Sexual Activity   Alcohol use: Yes    Alcohol/week: 5.0 standard drinks of alcohol    Types: 5 Glasses of wine per week    Comment: social   Drug use: Never   Sexual activity: Not on file  Other Topics Concern   Not on file  Social History Narrative   Not on file   Social Determinants of Health   Financial Resource Strain: Not on file  Food Insecurity: No Food Insecurity (06/10/2022)   Hunger Vital Sign    Worried About Running Out of Food in the Last Year: Never true    Ran Out of Food in the Last Year: Never true  Transportation Needs: No Transportation Needs (06/10/2022)   PRAPARE - Administrator, Civil Service (Medical): No    Lack of Transportation (Non-Medical): No  Physical Activity: Not on file  Stress: Not on file  Social Connections: Not on file  Intimate Partner Violence: Not At Risk (06/10/2022)   Humiliation, Afraid, Rape, and Kick questionnaire    Fear of Current or Ex-Partner: No    Emotionally Abused: No    Physically Abused: No    Sexually Abused: No    Family History:   Family History  Problem Relation Age of Onset   Cancer Paternal Grandfather    Colon cancer Neg Hx      ROS:  Please see the history of present illness.  All other ROS reviewed and negative.     Physical Exam/Data:   Vitals:   06/10/22 1912 06/10/22 1945 06/10/22 2340 06/11/22 0348  BP:  137/66 (!) 142/78 119/71  Pulse:  75 62 (!) 58  Resp:   16 16  Temp:  97.9 F (36.6 C) 98.1 F (36.7 C) 97.6 F (36.4 C)  TempSrc:  Oral Oral Oral  SpO2: 91% 94% 95% 94%  Weight:      Height:        Intake/Output Summary (Last 24 hours) at 06/11/2022 0730 Last data filed at 06/11/2022 0545 Gross per 24 hour  Intake 1004.03 ml  Output 425 ml  Net 579.03 ml      06/10/2022    1:01 PM 06/07/2022    1:44 PM 07/14/2020    3:55 PM  Last 3 Weights  Weight (lbs) 171 lb  171 lb 15.3 oz 174 lb  Weight (kg) 77.565 kg 78 kg 78.926 kg     Body mass index is 23.85 kg/m.  General:  Well nourished, well developed, in no acute distress HEENT: normal Neck: no JVD Vascular: No carotid bruits; Distal pulses 2+  bilaterally Cardiac:  normal S1, S2; RRR; 3/6 systolic murmur Lungs:  clear to auscultation bilaterally, no wheezing, rhonchi or rales  Abd: soft, nontender, no hepatomegaly  Ext: no edema Musculoskeletal:  No deformities, BUE and BLE strength normal and equal Skin: warm and dry  Neuro:  CNs 2-12 intact, no focal abnormalities noted Psych:  Normal affect   EKG:  The EKG was personally reviewed and demonstrates:  Normal sinus rhythm heart rate 60. Telemetry:  Telemetry was personally reviewed and demonstrates: Normal sinus rhythm heart rates 50-60  Relevant CV Studies: 3-day Holter monitor February 2024 per chart review The 3 day Holter monitor revealed sinus rhythm. Average heart rate 74, low 47, and high sinus HR 120. There were 805 SVE's with 30 couplets and 27 atrial runs, longest 11 beats rate 119 and fastest 3 beats rate 164. There were 4,150 PVCs with 161 couplets. Short runs of trigeminy. The event button was not activated.   Echocardiogram 03/14/2022 Normal left ventricular size, wall thickness, systolic function and wall motion. LVEF calculated by biplane Simpson's was 58%. Normal diastolic function and filling pressures.* Normal right ventricular cavity size and systolic function.* Moderate aortic valve calcification. Mild aortic stenosis. Peak aortic velocity is 2.5 m/s with a calculated peak aortic gradient of 26 mmHg. Mean gradient is 13 mmHg. Aortic valve area is 1.4 cm2. Dimensionless index is 0.44. Mild aortic regurgitation.* Normal mitral valve leaflets. Mild posterior mitral annular calcification. Trace mitral regurgitation.* Mild tricuspid regurgitation with normal right heart pressures.* No significant pericardial effusion.* No prior study  available for comparison.   Laboratory Data:  High Sensitivity Troponin:  No results for input(s): "TROPONINIHS" in the last 720 hours.   Chemistry Recent Labs  Lab 06/10/22 1312 06/11/22 0551  NA 127* 133*  K 4.5 4.0  CL 98 102  CO2 21* 25  GLUCOSE 100* 100*  BUN 31* 33*  CREATININE 1.54* 1.73*  CALCIUM 8.4* 8.0*  GFRNONAA 45* 39*  ANIONGAP 8 6    Recent Labs  Lab 06/10/22 1312  PROT 6.4*  ALBUMIN 3.4*  AST 19  ALT 15  ALKPHOS 45  BILITOT 0.8   Lipids No results for input(s): "CHOL", "TRIG", "HDL", "LABVLDL", "LDLCALC", "CHOLHDL" in the last 168 hours.  Hematology Recent Labs  Lab 06/10/22 1312 06/11/22 0551  WBC 9.8 8.9  RBC 4.29 4.26  HGB 15.0 14.6  HCT 43.4 43.8  MCV 101.2* 102.8*  MCH 35.0* 34.3*  MCHC 34.6 33.3  RDW 13.3 13.3  PLT 137* 136*   Thyroid  Recent Labs  Lab 06/10/22 2114  TSH 1.989    BNPNo results for input(s): "BNP", "PROBNP" in the last 168 hours.  DDimer No results for input(s): "DDIMER" in the last 168 hours.   Radiology/Studies:  MR HIP RIGHT WO CONTRAST  Result Date: 06/10/2022 CLINICAL DATA:  Hip trauma, fracture suspected, xray done EXAM: MR OF THE RIGHT HIP WITHOUT CONTRAST TECHNIQUE: Multiplanar, multisequence MR imaging was performed. No intravenous contrast was administered. COMPARISON:  CT 06/07/2022 FINDINGS: Bones: No acute fracture. No dislocation. No femoral head avascular necrosis. Bony pelvis intact without diastasis. SI joints and pubic symphysis within normal limits. No bone marrow edema. No marrow replacing bone lesion. Articular cartilage and labrum Articular cartilage: Areas of moderate chondral thinning and surface irregularity, most pronounced at the anterosuperior aspect of the hip joint. No subchondral marrow signal changes. Labrum: Degenerated, not well assessed in the absence of intra-articular contrast. No paralabral cyst. Joint or bursal effusion Joint effusion:  None.  Bursae: No abnormal bursal fluid  collection. Muscles and tendons Muscles and tendons: Mild tendinosis of the bilateral gluteus medius and minimus tendons. The hamstring, iliopsoas, rectus femoris, and adductor tendons appear intact without tear or significant tendinosis. Normal muscle bulk without atrophy or fatty infiltration. Other findings Miscellaneous: Mild generalized subcutaneous edema, nonspecific. No fluid collection. No inguinal lymphadenopathy. Trabeculated urinary bladder wall. Colonic diverticulosis. IMPRESSION: 1. No acute osseous abnormality of the right hip. 2. Moderate osteoarthritis of the right hip. 3. Mild tendinosis of the bilateral gluteus medius and minimus tendons. Electronically Signed   By: Duanne Guess D.O.   On: 06/10/2022 18:49   MR LUMBAR SPINE WO CONTRAST  Result Date: 06/10/2022 CLINICAL DATA:  Low back pain, increased fracture risk EXAM: MRI LUMBAR SPINE WITHOUT CONTRAST TECHNIQUE: Multiplanar, multisequence MR imaging of the lumbar spine was performed. No intravenous contrast was administered. COMPARISON:  None Available. FINDINGS: Segmentation:  Standard. Alignment: Grade 1 anterolisthesis of L4 on L5. Slight retrolisthesis at L5-S1. Mild lumbar levocurvature. Vertebrae: No fracture, evidence of discitis, or bone lesion. Discogenic endplate marrow changes are most pronounced at L2-3 on the right and L5-S1 on the left. Chronic superior endplate Schmorl's node at L5 without associated marrow edema. Conus medullaris and cauda equina: Conus extends to the L1 level. Conus and cauda equina appear normal. Paraspinal and other soft tissues: Urinary bladder is moderately distended with trabeculated wall. No acute abnormality. Disc levels: T12-L1: No significant disc protrusion, foraminal stenosis, or canal stenosis. L1-L2: Minimal disc bulge and mild bilateral facet arthropathy. Borderline-mild canal stenosis. No significant foraminal stenosis. L2-L3: Disc bulge and endplate spurring, eccentric to the right.  Moderate bilateral facet arthropathy with ligamentum flavum buckling. Moderate canal stenosis with right-sided subarticular recess stenosis. Mild right foraminal stenosis. L3-L4: Mild annular disc bulge with advanced bilateral facet arthropathy and ligamentum flavum buckling. Moderate canal stenosis and mild bilateral foraminal stenosis. L4-L5: Disc uncovering with diffuse disc bulge. Severe bilateral facet arthropathy with ligamentum flavum buckling and thin synovial cyst at the midline. There is severe canal stenosis with bilateral subarticular recess stenosis. Moderate left foraminal stenosis. L5-S1: Disc bulge and endplate spurring with mild-to-moderate bilateral facet arthropathy. No canal stenosis or foraminal stenosis, however there is mass effect upon the exited bilateral L5 nerve roots by osteophytic endplate ridging. IMPRESSION: 1. 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 Moderate left foraminal stenosis at this level. 2. Moderate canal stenosis at L2-3 and L3-4. 3. Moderately distended urinary bladder with trabeculated wall, which may be secondary to chronic bladder outlet obstruction versus neurogenic bladder. Electronically Signed   By: Duanne Guess D.O.   On: 06/10/2022 18:40   CT HEAD WO CONTRAST ( )  Result Date: 06/10/2022 CLINICAL DATA:  Trauma, fall EXAM: CT HEAD WITHOUT CONTRAST TECHNIQUE: Contiguous axial images were obtained from the base of the skull through the vertex without intravenous contrast. RADIATION DOSE REDUCTION: This exam was performed according to the departmental dose-optimization program which includes automated exposure control, adjustment of the mA and/or kV according to patient size and/or use of iterative reconstruction technique. COMPARISON:  None Available. FINDINGS: Brain: No acute intracranial findings are seen. There are no signs of bleeding within the cranium. There  is no focal mass effect. Cortical sulci are prominent. There is decreased density in periventricular white matter. Scattered arterial calcifications are seen. Vascular: Scattered arterial calcifications are seen. Skull: No acute findings are seen. Sinuses/Orbits: There is mucosal thickening and possible small  air-fluid level in right maxillary sinus. Other: None. IMPRESSION: No acute intracranial findings are seen. Atrophy. 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 Electronically Signed   By: Ernie Avena M.D.   On: 06/10/2022 15:11   DG Chest Portable 1 View  Result Date: 06/10/2022 CLINICAL DATA:  Weakness EXAM: PORTABLE CHEST 1 VIEW COMPARISON:  04/10/2020 FINDINGS: Sternal wires. Normal cardiopericardial silhouette. No pneumothorax or effusion. There is a density along the right side of the mediastinum inferiorly consistent with known history of a hiatal hernia. No edema or consolidation. Overlapping cardiac leads. IMPRESSION: Postop chest.  Hiatal hernia Electronically Signed   By: Karen Kays M.D.   On: 06/10/2022 13:58   VAS [place] LOWER EXTREMITY VENOUS (DVT) (7a-7p)  Result Date: 06/08/2022  Lower Venous DVT Study Patient Name:  Dave Silva.  Date of Exam:   06/07/2022 Medical Rec #: [id number]           Accession #:    [id number] Date of Birth: 1940-03-02           Patient Gender: M Patient Age:   38 years Exam Location:  [facility] Procedure:      VAS [place] LOWER EXTREMITY VENOUS (DVT) Referring Phys: HALEY SAGE --------------------------------------------------------------------------------  Indications: Pain.  Risk Factors: Trauma. Comparison Study: No prior studies. Performing Technologist: Chanda Busing RVT  Examination Guidelines: A complete evaluation includes B-mode imaging, spectral Doppler, color Doppler, and power Doppler as needed of all accessible portions of each vessel. Bilateral testing is considered an integral part of a complete examination.  Limited examinations for reoccurring indications may be performed as noted. The reflux portion of the exam is performed with the patient in reverse Trendelenburg.  +---------+---------------+---------+-----------+----------+--------------+ RIGHT    CompressibilityPhasicitySpontaneityPropertiesThrombus Aging +---------+---------------+---------+-----------+----------+--------------+ CFV      Full           Yes      Yes                                 +---------+---------------+---------+-----------+----------+--------------+ SFJ      Full                                                        +---------+---------------+---------+-----------+----------+--------------+ FV Prox  Full                                                        +---------+---------------+---------+-----------+----------+--------------+ FV Mid   Full                                                        +---------+---------------+---------+-----------+----------+--------------+ FV DistalFull                                                        +---------+---------------+---------+-----------+----------+--------------+  PFV      Full                                                        +---------+---------------+---------+-----------+----------+--------------+ POP      Full           Yes      Yes                                 +---------+---------------+---------+-----------+----------+--------------+ PTV      Full                                                        +---------+---------------+---------+-----------+----------+--------------+ PERO     Full                                                        +---------+---------------+---------+-----------+----------+--------------+   +----+---------------+---------+-----------+----------+--------------+ LEFTCompressibilityPhasicitySpontaneityPropertiesThrombus Aging  +----+---------------+---------+-----------+----------+--------------+ CFV Full           Yes      Yes                                 +----+---------------+---------+-----------+----------+--------------+     Summary: RIGHT: - There is no evidence of deep vein thrombosis in the lower extremity.  - No cystic structure found in the popliteal fossa.  LEFT: - No evidence of common femoral vein obstruction.  *See table(s) above for measurements and observations. Electronically signed by Heath Lark on 06/08/2022 at 1:35:35 PM.    Final    CT Hip Right Wo Contrast  Result Date: 06/07/2022 CLINICAL DATA:  Proximal right leg pain after fall 05/29/2022 in [place]. Decreased range of motion. EXAM: CT OF THE RIGHT HIP WITHOUT CONTRAST TECHNIQUE: Multidetector CT imaging of the right hip was performed according to the standard protocol. Multiplanar CT image reconstructions were also generated. RADIATION DOSE REDUCTION: This exam was performed according to the departmental dose-optimization program which includes automated exposure control, adjustment of the mA and/or kV according to patient size and/or use of iterative reconstruction technique. COMPARISON:  Pelvis and right hip radiographs 06/07/2022 FINDINGS: Bones/Joint/Cartilage Mildly decreased bone mineralization. Moderate superomedial right femoroacetabular joint space narrowing with mild superolateral acetabular degenerative osteophytosis.1 No acute fracture is seen. Ligaments Suboptimally assessed by CT. Muscles and Tendons Normal size and density of the regional musculature. Soft tissues No right hip joint effusion. High-grade atherosclerotic calcifications. Mild-to-moderate right fat containing right inguinal hernia with apparent fluid at the distal aspect. IMPRESSION: 1. No acute fracture is seen. 2. Moderate right femoroacetabular osteoarthritis. 3. Mild-to-moderate right fat containing inguinal hernia. Electronically Signed   By: Neita Garnet M.D.    On: 06/07/2022 15:57   DG Hip Unilat W or Wo Pelvis 2-3 Views Right  Result Date: 06/07/2022 CLINICAL DATA:  Fall, brought in by ambulance for right upper leg pain since fall on 04/17 in [place].  Decreased range of motion. Difficulty walking. EXAM: DG HIP (WITH OR WITHOUT PELVIS) 2-3V RIGHT COMPARISON:  None Available. FINDINGS: There is no evidence of hip fracture or dislocation. Superolateral hip joint space narrowing with marginal spurring. Prominent vascular calcifications. IMPRESSION: 1. No acute fracture or dislocation. MRI examination for further evaluation is recommended if there is high clinical suspicion for a hip fracture. 2. Mild right hip osteoarthritis. Electronically Signed   By: Larose Hires D.O.   On: 06/07/2022 15:11     Assessment and Plan:   Syncope with subsequent episode of persistent weakness Bradycardia  Patient with recent hospital admission 2 weeks ago for episode of syncope that was thought to be vasovagal due to to a sneezing fit, after the incident patient reports having normal strength.  He has returned back to the hospital with episode of weakness that has persisted. He has been bradycardic throughout this admission and has had notable signs of dehydration including hyponatremia and AKI.  MRI indicating distended urinary bladder secondary to bladder obstruction versus neurogenic bladder. EKG and telemetry not indicating any arrhythmias.  Previous outpatient 3 day monitor noting normal sinus rhythm with short runs of SVT and mild ectopy.  Previous echo in Feb of 2024 not indicating significant valvular disease.  Likely his weakness is more attributed to dehydration or possible bladder issues rather than any underlying cardiac cause.  However we will continue to workup for any other secondary causes. Echocardiogram pending Consider Holter monitor at discharge for longer duration at discharge to rule out any arrhythmias Continue to correct electrolyte imbalances and  hydration status per primary team Previous holter results: 805 SVE's with 30 couplets and 27 atrial runs, longest 11 beats rate 119 and fastest 3 beats rate 164. There were 4,150 PVCs with 161 couplets. Short runs of trigeminy  Guillain-Barr syndrome? Given patient's recent travel abroad and subsequent illness with urinary retention and new onset of muscle weakness, may want to consider further workup for GBS if no other underlying causes are identified or if muscle weakness worsens.  Hx of carotid artery stenosis No bruits on exam, can consider carotid US  Mild aortic stenosis Previous mean gradient of 13.  Will reevaluate when echo results.  CAD status post CABG x 4 in 2010 Stable, no complaints of chest pain or SOB. Primary team can resume aspirin once fractures have been rule out.  Continue with rosuvastatin 40 mg daily  Hypertension Continue irbesartan 75 mg daily  Hyperlipidemia Continue rosuvastatin 40 mg daily  AKI Hyponatremia 127-133 Right hip pain Per primary team   Risk Assessment/Risk Scores:   For questions or updates, please contact [facility] Please consult [email or web address] for contact info under    Signed, Abagail Kitchens, PA-C  06/11/2022 7:30 AM   Personally seen and examined. Agree with above.  Sneezing then syncope - vasovagal Generalized weakness, fall Not likely cardiac Bradycardia is mild. Tele unremarkable.  2/6 SM - mild AS, should not be of clinical significance.  CAD is stable.  ECHO prelim EF normal When DC, can set him up with ZIO monitor for 2 weeks. He lives in [place].   Donato Schultz, MD

## 2022-06-11 NOTE — Progress Notes (Signed)
[facility] PROGRESS NOTE  Dave Silva. (DOB: [birth year]) YNW:[id number] PCP: Patient, No Pcp Per  Brief Narrative: Dave Silva. is an 82 y.o. male with a history of CAD s/p 4v CABG 2010, asthma/COPD, HTN, dyslipidemia, prostate CA treated w/radiation who presented to the ED on 06/10/2022 with worsening leg weakness, falls and low back pain. There was concern during a trip to [place] for syncope and fall onto right hip/back with subsequently negative radiographic evaluation for fracture. On follow up with orthopedics 4/28 he was prescribed oxycodone, gabapentin, and prednisone, but presented to the ED the next day with worse pain and its impairing his ability to ambulate. In the ED he was afebrile with intact sensation and strength in lower extremities, ECG with NSR, incomplete RBBB. SCr 1.54 from presumptive baseline of 0.93, BUN 31, sodium 127. IV fluids were given and admission was requested. CT head showed atrophy, small vessel disease, acute/chronic maxillary sinusitis without acute intracranial findings. Subsequent MRI of the lumbar spine and right hip revealed no fracture or dislocation. There is severe canal stenosis at L4 level for which orthopedics/spine surgery is consulted. Cardiology was consulted, suspecting vasovagal etiology to passing out episode previously and, after echo showed preserved LV systolic function and aortic stenosis, recommended outpatient cardiac monitoring after discharge.   Subjective: Minimal pain at rest, but severe low back pain shooting down the back of both legs when attempting to get up. Retained urine after only being able to void 100cc this AM, has LUTS at baseline. Normal BM this morning, no incontinence or saddle anesthesia. No passing out [place].   Objective: BP (!) 147/83 (BP Location: Left Arm)   Pulse (!) 56   Temp 98.3 F (36.8 C) (Oral)   Resp 16   Ht 5\' 11"  (1.803 m)   Wt 77.6 kg   SpO2 95%   BMI 23.85  kg/m   Gen: Elderly pleasant male in no acute distress Pulm: Clear, nonlabored  CV: Regular borderline bradycardia with II/VI lower pitched systolic murmur at the base, trace edema, no JVD.  GI: Soft, NT, ND, +BS Neuro: Alert and oriented, a bit frazzled but appropriate cognition for conversation. Negative babinski, no impaired sensation in saddle area or legs, supine LE strength is preserved bilaterally Ext: Warm, no deformities Skin: Healed laceration on right arm. 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 Retired Tax adviser, PhD Environmental manager. Multifactorial abrupt deterioration inclusive of multiple medications.  - Continue oxycodone prn pain. Given severity of spinal stenosis, discussed with orthopedics Dr. 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  - Appreciate spine surgery, Dr. Shon Baton evaluating the patient. No urgent surgery required, no cauda equina syndrome. May need decompression surgically if unable to ambulate. PT/OT evaluations pending.   Acute urinary retention: Imaging with trabeculations in bladder, retaining >450cc postvoid this AM, RN unable to place foley.  -  Urology consult appreciated, suspect postrenal etiology to renal impairment worsening despite IVF with bland UA.   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  - Needs foley, appreciate  urology - UA bland w/u RBCs, casts, protein.  - Can stop IVF once taking adequate po.   Hyponatremia: Improving  HTN: Valsartan 80mg  per PCP note (in [place], Dr. Eldridge Scot) - Continue formulary ARB.   GERD:  - Continue PPI, EMR shows plan for outpatient EGD.   Asthma/COPD: Quiescent. PFT June 2022: FVC 3.64 (86%), FEV1 2.53 (84%), ratio 69, TLC 4.35 (59%), DLCOunc 7.68 (30%) corrects to 58% with lung volumes/ Minimal obstruction without BD response. Moderate-severe diffusion defect.  - Followed by Dr. Sherene Sires, on singulair and stiolto respimat (continue LAMA, LABA formulary) and prn albuterol.   CAD s/p CABG, HLD: No anginal complaints, no ischemic ECG features. No WMA on echo, LVEF 55-60%, normal RV - Continue ASA 81mg , rosuvastatin 40mg , zetia 10mg .   Syncope: Vasovagal seeming.  - Appreciate cardiology evaluation. Will keep on cardiac monitoring and arrange zio patch at DC.   Depression: Quiescent.  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 Stable on recheck, no bleeding.  Macrocytosis:  - Check vitamin B12, folic acid.   Dave Nine, MD [facility] [email or web address] 06/11/2022, 1:50 PM

## 2022-06-11 NOTE — Op Note (Signed)
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  EBL: Minimal  Specimens: None  Indication: Dave Silva. is a 82 y.o. patient with history of prostate cancer status post radical prostatectomy who presented to the emergency department after a fall and some concern for neurologic impingement he was found to be in urinary retention at the time of evaluation.  Unsuccessful catheter attempts x 2 in the ER.  We then attempted to pass a 14 [place] Silastic catheter unsuccessfully.  We then attempted to do a bedside cystoscopy and was unable to get a wire across the stricture.  After reviewing the management options for treatment, he elected to proceed with the above surgical procedure(s). We have discussed the potential benefits and risks of the procedure, side effects of the proposed treatment, the likelihood of the patient achieving the goals of the procedure, and any potential problems that might occur during the procedure or recuperation. Informed consent has been obtained.  Description of procedure:  The patient was taken to the operating room and general anesthesia was induced.  The patient was placed in the dorsal lithotomy position, prepped and draped in the usual sterile fashion, and preoperative antibiotics were administered. A preoperative time-out was performed.   21 French 30 Cisco was then applied to the patient's urethra up to the bladder neck/membranous urethra.  There was a very dense stricture with a small pinhole that we were able to get a sensor wire across.  We then removed the scope over the wire and then repassed the scope and alongside the wire.  We then advanced a 30 French 75 cm Optilume  balloon over the wire and across the stricture or contracture.  I then inflated the balloon to 10 cc H2O and allowed it to dwell for 5 minutes.  Reinspection of the dilated stricture demonstrated broadly patent bladder neck.  We then advanced a 14 French Foley catheter alongside the wire and it easily passed into the patient's urethra.  Clear urine was obtained.  Disposition: Recommend the patient keep the Foley catheter for at least 24 hours followed by voiding trial.  Crist Fat, M.D.

## 2022-06-11 NOTE — Progress Notes (Signed)
Echocardiogram 2D Echocardiogram has been performed.  Toni Amend 06/11/2022, 11:12 AM

## 2022-06-11 NOTE — Consult Note (Signed)
Urology Consult  Referring physician: Dr. Jarvis Newcomer Reason for referral: Urinary retention, inability to place Foley  Chief Complaint: Weak urinary stream  History of Present Illness:  Dave Silva, [place]. is a pleasant 82 year old male in hospital for the past 2 days.  Per the family the patient was on vacation in [place] and fell off of his chair at [employer] on 05/29/2022, following a sneezing fit that led to a syncopal event.  Current right-sided hip pain which did not show a fracture when x-rayed at a local clinic.  He returned to the [place] the next day and was unable to ambulate due to severe pain the next morning.  The day before his admission to the hospital the patient went upstairs to take a nap but awoke on the bathroom floor.  No LOC or head trauma was noted, at this time daughter opted to bring him to the emergency department.  PMH significant for CAD x 4, asthma/COPD, and HTN.  He reports having "part of his prostate removed for a long time ago but could not offer additional urologic history.  On arrival it was became clear that patient had been urinating but had just been having a weak stream.  Nursing had attempted to place a Foley catheter but reported it curling.  Past Medical History:  Diagnosis Date   Asthma    Cancer (HCC)    Cataract    COPD (chronic obstructive pulmonary disease) (HCC)    Emphysema of lung (HCC)    Hyperlipidemia    Hypertension    Past Surgical History:  Procedure Laterality Date   CORONARY ARTERY BYPASS GRAFT     TONSILLECTOMY       Allergies:  Allergies  Allergen Reactions   Other Nausea And Vomiting and Other (See Comments)    Certain "stronger" pain medications make the patient's stomach hurt, also (must have an anti emetic to tolerate)    Family History  Problem Relation Age of Onset   Cancer Paternal Grandfather    Colon cancer Neg Hx    Social History:  reports that he quit smoking about 37 years ago. His smoking use included  cigarettes. He started smoking about 57 years ago. He has a 60.00 pack-year smoking history. He has never used smokeless tobacco. He reports current alcohol use of about 5.0 standard drinks of alcohol per week. He reports that he does not use drugs.  Review of Systems  Genitourinary:  Positive for dysuria.       Progressively worsening urinary stream.      Physical Exam:  Vital signs in last 24 hours: Temp:  [97.6 F (36.4 C)-98.3 F (36.8 C)] 98.3 F (36.8 C) (04/30 1340) Pulse Rate:  [51-75] 56 (04/30 1340) Resp:  [14-17] 16 (04/30 1340) BP: (119-160)/(66-83) 147/83 (04/30 1340) SpO2:  [91 %-98 %] 95 % (04/30 1340)  Cardiovascular: Skin warm; not flushed Respiratory: Breaths quiet; no shortness of breath Abdomen: No masses Neurological: Normal sensation to touch Musculoskeletal: Normal motor function arms and legs Lymphatics: No inguinal adenopathy Skin: Diffuse bruising and flaking skin. Genitourinary: Clear yellow urine in urinal attached at bedside  Laboratory Data:  Results for orders placed or performed during the hospital encounter of 06/10/22 (from the past 72 hour(s))  Urinalysis, Routine w reflex microscopic -Urine, Unspecified Source     Status: Abnormal   Collection Time: 06/10/22  1:12 PM  Result Value Ref Range   Color, Urine STRAW (A) YELLOW   APPearance CLEAR CLEAR   Specific  Gravity, Urine 1.006 1.005 - 1.030   pH 6.0 5.0 - 8.0   Glucose, UA NEGATIVE NEGATIVE mg/dL   Hgb urine dipstick SMALL (A) NEGATIVE   Bilirubin Urine NEGATIVE NEGATIVE   Ketones, ur NEGATIVE NEGATIVE mg/dL   Protein, ur NEGATIVE NEGATIVE mg/dL   Nitrite NEGATIVE NEGATIVE   Leukocytes,Ua NEGATIVE NEGATIVE   RBC / HPF 0-5 0 - 5 RBC/hpf   WBC, UA 0-5 0 - 5 WBC/hpf   Bacteria, UA RARE (A) NONE SEEN   Squamous Epithelial / HPF 0-5 0 - 5 /HPF    Comment: Performed at [facility], [address], [place], [place] [address]  Comprehensive metabolic panel     Status:  Abnormal   Collection Time: 06/10/22  1:12 PM  Result Value Ref Range   Sodium 127 (L) 135 - 145 mmol/L   Potassium 4.5 3.5 - 5.1 mmol/L   Chloride 98 98 - 111 mmol/L   CO2 21 (L) 22 - 32 mmol/L   Glucose, Bld 100 (H) 70 - 99 mg/dL    Comment: Glucose reference range applies only to samples taken after fasting for at least 8 hours.   BUN 31 (H) 8 - 23 mg/dL   Creatinine, Ser 2.13 (H) 0.61 - 1.24 mg/dL   Calcium 8.4 (L) 8.9 - 10.3 mg/dL   Total Protein 6.4 (L) 6.5 - 8.1 g/dL   Albumin 3.4 (L) 3.5 - 5.0 g/dL   AST 19 15 - 41 U/L   ALT 15 0 - 44 U/L   Alkaline Phosphatase 45 38 - 126 U/L   Total Bilirubin 0.8 0.3 - 1.2 mg/dL   GFR, Estimated 45 (L) >60 mL/min    Comment: (NOTE) Calculated using the CKD-EPI Creatinine Equation (2021)    Anion gap 8 5 - 15    Comment: Performed at [facility], [address] [place], [place] [address]  CBC     Status: Abnormal   Collection Time: 06/10/22  1:12 PM  Result Value Ref Range   WBC 9.8 4.0 - 10.5 K/uL   RBC 4.29 4.22 - 5.81 MIL/uL   Hemoglobin 15.0 13.0 - 17.0 g/dL   HCT 84.6 96.2 - 95.2 %   MCV 101.2 (H) 80.0 - 100.0 fL   MCH 35.0 (H) 26.0 - 34.0 pg   MCHC 34.6 30.0 - 36.0 g/dL   RDW 84.1 32.4 - 40.1 %   Platelets 137 (L) 150 - 400 K/uL   nRBC 0.0 0.0 - 0.2 %    Comment: Performed at [facility], [address] [place], [place] [address]  Osmolality, urine     Status: Abnormal   Collection Time: 06/10/22  4:22 PM  Result Value Ref Range   Osmolality, Ur 238 (L) 300 - 900 mOsm/kg    Comment: Performed at [facility], [address] [place], [place] [address]  Sodium, urine, random     Status: None   Collection Time: 06/10/22  4:22 PM  Result Value Ref Range   Sodium, Ur 14 mmol/L    Comment: Performed at [facility], [address], [place], [place] [address]  Osmolality     Status: None   Collection Time: 06/10/22  9:14 PM  Result Value Ref Range    Osmolality 289 275 - 295 mOsm/kg    Comment: PERFORMED AT [facility] Performed at [facility], [address], [place], [place] [address]   CK  Status: Abnormal   Collection Time: 06/10/22  9:14 PM  Result Value Ref Range   Total CK 34 (L) 49 - 397 U/L    Comment: Performed at [facility], [address] [place], [place] [address]  TSH     Status: None   Collection Time: 06/10/22  9:14 PM  Result Value Ref Range   TSH 1.989 0.350 - 4.500 uIU/mL    Comment: Performed by a 3rd Generation assay with a functional sensitivity of <=0.01 uIU/mL. Performed at [facility], [address] [place], [place] [address]   Basic metabolic panel     Status: Abnormal   Collection Time: 06/11/22  5:51 AM  Result Value Ref Range   Sodium 133 (L) 135 - 145 mmol/L   Potassium 4.0 3.5 - 5.1 mmol/L   Chloride 102 98 - 111 mmol/L   CO2 25 22 - 32 mmol/L   Glucose, Bld 100 (H) 70 - 99 mg/dL    Comment: Glucose reference range applies only to samples taken after fasting for at least 8 hours.   BUN 33 (H) 8 - 23 mg/dL   Creatinine, Ser 0.98 (H) 0.61 - 1.24 mg/dL   Calcium 8.0 (L) 8.9 - 10.3 mg/dL   GFR, Estimated 39 (L) >60 mL/min    Comment: (NOTE) Calculated using the CKD-EPI Creatinine Equation (2021)    Anion gap 6 5 - 15    Comment: Performed at [facility], [address], [place], [place] [address]  CBC     Status: Abnormal   Collection Time: 06/11/22  5:51 AM  Result Value Ref Range   WBC 8.9 4.0 - 10.5 K/uL   RBC 4.26 4.22 - 5.81 MIL/uL   Hemoglobin 14.6 13.0 - 17.0 g/dL   HCT 78.2 95.6 - 21.3 %   MCV 102.8 (H) 80.0 - 100.0 fL   MCH 34.3 (H) 26.0 - 34.0 pg   MCHC 33.3 30.0 - 36.0 g/dL   RDW 08.6 57.8 - 46.9 %   Platelets 136 (L) 150 - 400 K/uL   nRBC 0.0 0.0 - 0.2 %    Comment: Performed at [facility], [address] [place], [place] [address]   No results found for this or any  previous visit (from the past 240 hour(s)). Creatinine: Recent Labs    06/10/22 1312 06/11/22 0551  CREATININE 1.54* 1.73*    Imaging: No urinary imaging available.   Impression/Assessment:  In an attempt to place a Foley catheter it became clear that patient had either a significant stricture or false passage.  I was unable to pass a Foley catheter, and and later could also not pass a wire.  Given the opportunity for suprapubic tube placement versus cystoscopy with dilation and Foley placement, patient opted for the latter.  Plan:  To the OR for cystoscopy with dilation of stricture and Foley catheter placement.  Scherrie Bateman Tia Hieronymus 06/11/2022, 1:45 PM  Pager: [phone]

## 2022-06-11 NOTE — Anesthesia Procedure Notes (Signed)
Procedure Name: LMA Insertion Date/Time: 06/11/2022 4:17 PM  Performed by: Doran Clay, CRNAPre-anesthesia Checklist: Patient identified, Emergency Drugs available, Suction available, Patient being monitored and Timeout performed Patient Re-evaluated:Patient Re-evaluated prior to induction Oxygen Delivery Method: Circle system utilized Preoxygenation: Pre-oxygenation with 100% oxygen Induction Type: IV induction LMA: LMA inserted Tube type: Oral Number of attempts: 1 Placement Confirmation: positive ETCO2 and breath sounds checked- equal and bilateral Tube secured with: Tape Dental Injury: Teeth and Oropharynx as per pre-operative assessment

## 2022-06-11 NOTE — Anesthesia Postprocedure Evaluation (Signed)
Anesthesia Post Note  Patient: Dave Silva.  Procedure(s) Performed: CYSTOSCOPY WITH URETHRAL DILATATION AND FOLEY CATHETER PLACEMENT     Patient location during evaluation: PACU Anesthesia Type: General Level of consciousness: awake and alert Pain management: pain level controlled Vital Signs Assessment: post-procedure vital signs reviewed and stable Respiratory status: spontaneous breathing, nonlabored ventilation, respiratory function stable and patient connected to nasal cannula oxygen Cardiovascular status: blood pressure returned to baseline and stable Postop Assessment: no apparent nausea or vomiting Anesthetic complications: no  No notable events documented.  Last Vitals:  Vitals:   06/11/22 1730 06/11/22 1751  BP: 123/74 (!) 140/79  Pulse: (!) 57 61  Resp: 16 16  Temp:  (!) 36.3 C  SpO2: 96% 91%    Last Pain:  Vitals:   06/11/22 1730  TempSrc:   PainSc: Asleep                 [name]

## 2022-06-11 NOTE — Transfer of Care (Signed)
Immediate Anesthesia Transfer of Care Note  Patient: Dave Silva.  Procedure(s) Performed: CYSTOSCOPY WITH URETHRAL DILATATION AND FOLEY CATHETER PLACEMENT  Patient Location: PACU  Anesthesia Type:General  Level of Consciousness: sedated  Airway & Oxygen Therapy: Patient Spontanous Breathing and Patient connected to face mask oxygen  Post-op Assessment: Report given to RN and Post -op Vital signs reviewed and stable  Post vital signs: Reviewed and stable  Last Vitals:  Vitals Value Taken Time  BP 153/78 06/11/22 1647  Temp    Pulse 53 06/11/22 1648  Resp 13 06/11/22 1648  SpO2 99 % 06/11/22 1648  Vitals shown include unvalidated device data.  Last Pain:  Vitals:   06/11/22 1500  TempSrc:   PainSc: 0-No pain         Complications: No notable events documented.

## 2022-06-11 NOTE — Plan of Care (Signed)

## 2022-06-11 NOTE — H&P (Signed)
Chief Complaint: Antony Contras of low back pain with lower extremity weakness  History: DDonald is a very pleasant 82 year old gentleman who was seen by my partner Dr. Drucie Ip for back buttock and neuropathic leg pain several days ago.  A day after being evaluated he had progressive back buttock and bilateral lower extremity neuropathic pain and subjective weakness.  As result he went to the emergency room and has been admitted for further evaluation.  I was contacted by the hospitalist team for evaluation of his lumbar spine.  Review of systems: COPD/asthma, peripheral vascular disease, CABG x 4, hypertension.  No loss of consciousness, headaches, blurry vision.   Past Medical History:  Diagnosis Date   Asthma    Cancer (HCC)    Cataract    COPD (chronic obstructive pulmonary disease) (HCC)    Emphysema of lung (HCC)    Hyperlipidemia    Hypertension     Allergies  Allergen Reactions   Other Nausea And Vomiting and Other (See Comments)    Certain "stronger" pain medications make the patient's stomach hurt, also (must have an anti emetic to tolerate)    No current facility-administered medications on file prior to encounter.   Current Outpatient Medications on File Prior to Encounter  Medication Sig Dispense Refill   albuterol (VENTOLIN HFA) 108 (90 Base) MCG/ACT inhaler Inhale 2 puffs into the lungs every 6 (six) hours as needed for wheezing or shortness of breath.     aspirin 81 MG EC tablet Take 81 mg by mouth daily. Swallow whole.     DRAMAMINE-N 50 MG TABS Take 50 mg by mouth See admin instructions. Take 50 mg (1 tablet) by mouth with each dose of a stronger pain medication (to tolerate)     fluticasone (FLONASE) 50 MCG/ACT nasal spray Place 1-2 sprays into both nostrils daily as needed for allergies or rhinitis.     gabapentin (NEURONTIN) 100 MG capsule Take 100 mg by mouth at bedtime.     magnesium oxide (MAG-OX) 400 (240 Mg) MG tablet Take 400 mg by mouth daily.      montelukast (SINGULAIR) 10 MG tablet Take 10 mg by mouth at bedtime.     omeprazole (PRILOSEC) 20 MG capsule TAKE 1 CAPSULE BY MOUTH EVERY DAY (Patient taking differently: Take 20 mg by mouth daily before breakfast.) 90 capsule 0   predniSONE (STERAPRED UNI-PAK 21 TAB) 5 MG (21) TBPK tablet Take 5-30 mg by mouth daily. Take 30 mg (6 tablets) by mouth WITH FOOD on day one, then decrease by 5 mg daily until finsihed     rosuvastatin (CRESTOR) 40 MG tablet Take 40 mg by mouth daily.     Tiotropium Bromide-Olodaterol (STIOLTO RESPIMAT) 2.5-2.5 MCG/ACT AERS Inhale 2 puffs into the lungs daily. 4 g 6   valsartan (DIOVAN) 80 MG tablet Take 1 tablet (80 mg total) by mouth daily. (Patient taking differently: Take 80 mg by mouth at bedtime.) 30 tablet 11   Cholecalciferol (VITAMIN D3) 50 MCG (2000 UT) TABS Take 4,000 Units by mouth daily.     ezetimibe (ZETIA) 10 MG tablet Take 1 tablet (10 mg total) by mouth daily. (Patient not taking: Reported on 06/10/2022) 90 tablet 3   rosuvastatin (CRESTOR) 20 MG tablet Take 1 tablet (20 mg total) by mouth daily. (Patient not taking: Reported on 06/10/2022) 90 tablet 3   sertraline (ZOLOFT) 50 MG tablet Take 1 tablet (50 mg total) by mouth daily. (Patient not taking: Reported on 06/10/2022) 90 tablet 3  Physical Exam: Vitals:   06/11/22 0750 06/11/22 0814  BP: (!) 149/75   Pulse: (!) 53   Resp: 17   Temp: 97.8 F (36.6 C)   SpO2: 97% 98%   Body mass index is 23.85 kg/m. He is alert and oriented x 3. No shortness of breath or chest pain. Abdomen soft and nontender.  No incontinence of bowel and bladder.  Positive urinary retention. Full range of motion of the hip, knee, ankle.  Compartments are soft and nontender.  2+ dorsalis pedis/posterior tibialis pulses. In the supine position: 5/5 hip flexor/quad/EHL/tibialis anterior/gastrocnemius strength bilaterally.  Intact sensation light touch throughout the lower extremity.  Negative Babinski test, 1+ deep tendon  reflexes, negative nerve root tension signs in the lower extremity.  Intact perianal sensation to pinprick and normal sphincter tone.  Image: ECHOCARDIOGRAM COMPLETE  Result Date: 06/11/2022    ECHOCARDIOGRAM REPORT   Patient Name:   Corey Laski. Date of Exam: 06/11/2022 Medical Rec #:  [id number]          Height:       71.0 in Accession #:    [id number]         Weight:       171.0 lb Date of Birth:  14-Mar-1940          BSA:          1.973 m Patient Age:    82 years           BP:           149/75 mmHg Patient Gender: M                  HR:           59 bpm. Exam Location:  Inpatient Procedure: 2D Echo, Cardiac Doppler and Color Doppler Indications:    syncope  History:        Patient has no prior history of Echocardiogram examinations.                 COPD; Risk Factors:Hypertension and Dyslipidemia.  Sonographer:    Mike Gip Referring Phys: [id number] TOCHUKWU AGBATA IMPRESSIONS  1. Left ventricular ejection fraction, by estimation, is 55 to 60%. The left ventricle has normal function. The left ventricle has no regional wall motion abnormalities. Left ventricular diastolic function could not be evaluated.  2. Right ventricular systolic function is normal. The right ventricular size is normal. There is mildly elevated pulmonary artery systolic pressure. The estimated right ventricular systolic pressure is 38.8 mmHg.  3. The mitral valve is degenerative. No evidence of mitral valve regurgitation. No evidence of mitral stenosis. Moderate mitral annular calcification.  4. Tricuspid valve regurgitation is moderate.  5. 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 The aortic valve is calcified. Aortic valve regurgitation is mild. Aortic valve area, by VTI measures 0.79 cm. Aortic valve  mean gradient measures 16.0 mmHg. FINDINGS  Left Ventricle: Left ventricular ejection fraction, by estimation, is 55 to 60%. The left ventricle  has normal function. The left ventricle has no regional wall motion abnormalities. The left ventricular internal cavity size was normal in size. There is  no left ventricular hypertrophy. Left ventricular diastolic function could not be evaluated due to mitral annular calcification (moderate or greater). Left ventricular diastolic function could not be evaluated. Right Ventricle: The right ventricular size is normal. No increase in right ventricular wall thickness. Right ventricular systolic  function is normal. There is mildly elevated pulmonary artery systolic pressure. The tricuspid regurgitant velocity is 2.99  m/s, and with an assumed right atrial pressure of 3 mmHg, the estimated right ventricular systolic pressure is 38.8 mmHg. Left Atrium: Left atrial size was normal in size. Right Atrium: Right atrial size was normal in size. Pericardium: There is no evidence of pericardial effusion. Mitral Valve: The mitral valve is degenerative in appearance. Moderate mitral annular calcification. No evidence of mitral valve regurgitation. No evidence of mitral valve stenosis. Tricuspid Valve: The tricuspid valve is normal in structure. Tricuspid valve regurgitation is moderate . No evidence of tricuspid stenosis. Aortic Valve: 2D AVA 1.88 cm2. 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 The aortic valve is calcified. Aortic valve regurgitation is mild. Aortic regurgitation PHT measures 400 msec. Aortic valve mean gradient measures 16.0 mmHg. Aortic valve peak gradient measures 24.3 mmHg. Aortic valve area, by VTI measures 0.79 cm. Pulmonic Valve: The pulmonic valve was not well visualized. Pulmonic valve regurgitation is mild. No evidence of pulmonic stenosis. Aorta: The aortic root and ascending aorta are structurally normal, with no evidence of dilitation. IAS/Shunts: No atrial level shunt detected by color flow Doppler.  LEFT VENTRICLE PLAX 2D LVIDd:          4.10 cm     Diastology LVIDs:         2.90 cm     LV e' medial:    5.55 cm/s LV PW:         1.00 cm     LV E/e' medial:  14.1 LV IVS:        1.00 cm     LV e' lateral:   8.27 cm/s LVOT diam:     1.80 cm     LV E/e' lateral: 9.5 LV SV:         49 LV SV Index:   25 LVOT Area:     2.54 cm  LV Volumes (MOD) LV vol d, MOD A2C: 90.0 ml LV vol d, MOD A4C: 82.1 ml LV vol s, MOD A2C: 39.1 ml LV vol s, MOD A4C: 31.3 ml LV SV MOD A2C:     50.9 ml LV SV MOD A4C:     82.1 ml LV SV MOD BP:      51.2 ml RIGHT VENTRICLE             IVC RV Basal diam:  3.40 cm     IVC diam: 1.10 cm RV S prime:     11.90 cm/s TAPSE (M-mode): 1.8 cm LEFT ATRIUM             Index        RIGHT ATRIUM           Index LA diam:        2.80 cm 1.42 cm/m   RA Area:     13.80 cm LA Vol (A2C):   40.1 ml 20.33 ml/m  RA Volume:   32.20 ml  16.32 ml/m LA Vol (A4C):   57.6 ml 29.20 ml/m LA Biplane Vol: 51.9 ml 26.31 ml/m  AORTIC VALVE                     PULMONIC VALVE AV Area (Vmax):    0.83 cm      PR End Diast Vel: 3.76 msec AV Area (Vmean):   0.76 cm AV Area (VTI):     0.79 cm  AV Vmax:           246.33 cm/s AV Vmean:          177.667 cm/s AV VTI:            0.616 m AV Peak Grad:      24.3 mmHg AV Mean Grad:      16.0 mmHg LVOT Vmax:         80.20 cm/s LVOT Vmean:        53.100 cm/s LVOT VTI:          0.192 m LVOT/AV VTI ratio: 0.31 AI PHT:            400 msec  AORTA Ao Root diam: 3.50 cm Ao Asc diam:  3.30 cm MITRAL VALVE               TRICUSPID VALVE MV Area (PHT): 3.39 cm    TR Peak grad:   35.8 mmHg MV Decel Time: 224 msec    TR Vmax:        299.00 cm/s MV E velocity: 78.40 cm/s MV A velocity: 58.30 cm/s  SHUNTS MV E/A ratio:  1.34        Systemic VTI:  0.19 m                            Systemic Diam: 1.80 cm Riley Lam MD Electronically signed by Riley Lam MD Signature Date/Time: 06/11/2022/1:28:01 PM    Final    MR HIP RIGHT WO CONTRAST  Result Date: 06/10/2022 CLINICAL DATA:  Hip trauma, fracture suspected, xray  done EXAM: MR OF THE RIGHT HIP WITHOUT CONTRAST TECHNIQUE: Multiplanar, multisequence MR imaging was performed. No intravenous contrast was administered. COMPARISON:  CT 06/07/2022 FINDINGS: Bones: No acute fracture. No dislocation. No femoral head avascular necrosis. Bony pelvis intact without diastasis. SI joints and pubic symphysis within normal limits. No bone marrow edema. No marrow replacing bone lesion. Articular cartilage and labrum Articular cartilage: Areas of moderate chondral thinning and surface irregularity, most pronounced at the anterosuperior aspect of the hip joint. No subchondral marrow signal changes. Labrum: Degenerated, not well assessed in the absence of intra-articular contrast. No paralabral cyst. Joint or bursal effusion Joint effusion:  None. Bursae: No abnormal bursal fluid collection. Muscles and tendons Muscles and tendons: Mild tendinosis of the bilateral gluteus medius and minimus tendons. The hamstring, iliopsoas, rectus femoris, and adductor tendons appear intact without tear or significant tendinosis. Normal muscle bulk without atrophy or fatty infiltration. Other findings Miscellaneous: Mild generalized subcutaneous edema, nonspecific. No fluid collection. No inguinal lymphadenopathy. Trabeculated urinary bladder wall. Colonic diverticulosis. IMPRESSION: 1. No acute osseous abnormality of the right hip. 2. Moderate osteoarthritis of the right hip. 3. Mild tendinosis of the bilateral gluteus medius and minimus tendons. Electronically Signed   By: Duanne Guess D.O.   On: 06/10/2022 18:49   MR LUMBAR SPINE WO CONTRAST  Result Date: 06/10/2022 CLINICAL DATA:  Low back pain, increased fracture risk EXAM: MRI LUMBAR SPINE WITHOUT CONTRAST TECHNIQUE: Multiplanar, multisequence MR imaging of the lumbar spine was performed. No intravenous contrast was administered. COMPARISON:  None Available. FINDINGS: Segmentation:  Standard. Alignment: Grade 1 anterolisthesis of L4 on L5. Slight  retrolisthesis at L5-S1. Mild lumbar levocurvature. Vertebrae: No fracture, evidence of discitis, or bone lesion. Discogenic endplate marrow changes are most pronounced at L2-3 on the right and L5-S1 on the left. Chronic superior endplate Schmorl's node at L5 without associated marrow edema.  Conus medullaris and cauda equina: Conus extends to the L1 level. Conus and cauda equina appear normal. Paraspinal and other soft tissues: Urinary bladder is moderately distended with trabeculated wall. No acute abnormality. Disc levels: T12-L1: No significant disc protrusion, foraminal stenosis, or canal stenosis. L1-L2: Minimal disc bulge and mild bilateral facet arthropathy. Borderline-mild canal stenosis. No significant foraminal stenosis. L2-L3: Disc bulge and endplate spurring, eccentric to the right. Moderate bilateral facet arthropathy with ligamentum flavum buckling. Moderate canal stenosis with right-sided subarticular recess stenosis. Mild right foraminal stenosis. L3-L4: Mild annular disc bulge with advanced bilateral facet arthropathy and ligamentum flavum buckling. Moderate canal stenosis and mild bilateral foraminal stenosis. L4-L5: Disc uncovering with diffuse disc bulge. Severe bilateral facet arthropathy with ligamentum flavum buckling and thin synovial cyst at the midline. There is severe canal stenosis with bilateral subarticular recess stenosis. Moderate left foraminal stenosis. L5-S1: Disc bulge and endplate spurring with mild-to-moderate bilateral facet arthropathy. No canal stenosis or foraminal stenosis, however there is mass effect upon the exited bilateral L5 nerve roots by osteophytic endplate ridging. IMPRESSION: 1. 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 Moderate left foraminal stenosis at this level. 2. Moderate canal stenosis at L2-3 and L3-4. 3. Moderately distended urinary bladder with trabeculated  wall, which may be secondary to chronic bladder outlet obstruction versus neurogenic bladder. Electronically Signed   By: Duanne Guess D.O.   On: 06/10/2022 18:40   CT HEAD WO CONTRAST ( )  Result Date: 06/10/2022 CLINICAL DATA:  Trauma, fall EXAM: CT HEAD WITHOUT CONTRAST TECHNIQUE: Contiguous axial images were obtained from the base of the skull through the vertex without intravenous contrast. RADIATION DOSE REDUCTION: This exam was performed according to the departmental dose-optimization program which includes automated exposure control, adjustment of the mA and/or kV according to patient size and/or use of iterative reconstruction technique. COMPARISON:  None Available. FINDINGS: Brain: No acute intracranial findings are seen. There are no signs of bleeding within the cranium. There is no focal mass effect. Cortical sulci are prominent. There is decreased density in periventricular white matter. Scattered arterial calcifications are seen. Vascular: Scattered arterial calcifications are seen. Skull: No acute findings are seen. Sinuses/Orbits: There is mucosal thickening and possible small air-fluid level in right maxillary sinus. Other: None. IMPRESSION: No acute intracranial findings are seen. Atrophy. 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 Electronically Signed   By: Ernie Avena M.D.   On: 06/10/2022 15:11   DG Chest Portable 1 View  Result Date: 06/10/2022 CLINICAL DATA:  Weakness EXAM: PORTABLE CHEST 1 VIEW COMPARISON:  04/10/2020 FINDINGS: Sternal wires. Normal cardiopericardial silhouette. No pneumothorax or effusion. There is a density along the right side of the mediastinum inferiorly consistent with known history of a hiatal hernia. No edema or consolidation. Overlapping cardiac leads. IMPRESSION: Postop chest.  Hiatal hernia Electronically Signed   By: Karen Kays M.D.   On: 06/10/2022 13:58   VAS [place] LOWER EXTREMITY VENOUS (DVT) (7a-7p)  Result Date:  06/08/2022  Lower Venous DVT Study Patient Name:  Piero Mustard.  Date of Exam:   06/07/2022 Medical Rec #: [id number]           Accession #:    [id number] Date of Birth: 01-Apr-1940           Patient Gender: M Patient Age:   71 years Exam Location:  [facility] Procedure:      VAS [place] LOWER EXTREMITY VENOUS (DVT) Referring  Phys: HALEY SAGE --------------------------------------------------------------------------------  Indications: Pain.  Risk Factors: Trauma. Comparison Study: No prior studies. Performing Technologist: Chanda Busing RVT  Examination Guidelines: A complete evaluation includes B-mode imaging, spectral Doppler, color Doppler, and power Doppler as needed of all accessible portions of each vessel. Bilateral testing is considered an integral part of a complete examination. Limited examinations for reoccurring indications may be performed as noted. The reflux portion of the exam is performed with the patient in reverse Trendelenburg.  +---------+---------------+---------+-----------+----------+--------------+ RIGHT    CompressibilityPhasicitySpontaneityPropertiesThrombus Aging +---------+---------------+---------+-----------+----------+--------------+ CFV      Full           Yes      Yes                                 +---------+---------------+---------+-----------+----------+--------------+ SFJ      Full                                                        +---------+---------------+---------+-----------+----------+--------------+ FV Prox  Full                                                        +---------+---------------+---------+-----------+----------+--------------+ FV Mid   Full                                                        +---------+---------------+---------+-----------+----------+--------------+ FV DistalFull                                                         +---------+---------------+---------+-----------+----------+--------------+ PFV      Full                                                        +---------+---------------+---------+-----------+----------+--------------+ POP      Full           Yes      Yes                                 +---------+---------------+---------+-----------+----------+--------------+ PTV      Full                                                        +---------+---------------+---------+-----------+----------+--------------+ PERO     Full                                                        +---------+---------------+---------+-----------+----------+--------------+   +----+---------------+---------+-----------+----------+--------------+  LEFTCompressibilityPhasicitySpontaneityPropertiesThrombus Aging +----+---------------+---------+-----------+----------+--------------+ CFV Full           Yes      Yes                                 +----+---------------+---------+-----------+----------+--------------+     Summary: RIGHT: - There is no evidence of deep vein thrombosis in the lower extremity.  - No cystic structure found in the popliteal fossa.  LEFT: - No evidence of common femoral vein obstruction.  *See table(s) above for measurements and observations. Electronically signed by Heath Lark on 06/08/2022 at 1:35:35 PM.    Final    CT Hip Right Wo Contrast  Result Date: 06/07/2022 CLINICAL DATA:  Proximal right leg pain after fall 05/29/2022 in [place]. Decreased range of motion. EXAM: CT OF THE RIGHT HIP WITHOUT CONTRAST TECHNIQUE: Multidetector CT imaging of the right hip was performed according to the standard protocol. Multiplanar CT image reconstructions were also generated. RADIATION DOSE REDUCTION: This exam was performed according to the departmental dose-optimization program which includes automated exposure control, adjustment of the mA and/or kV according to patient size  and/or use of iterative reconstruction technique. COMPARISON:  Pelvis and right hip radiographs 06/07/2022 FINDINGS: Bones/Joint/Cartilage Mildly decreased bone mineralization. Moderate superomedial right femoroacetabular joint space narrowing with mild superolateral acetabular degenerative osteophytosis.1 No acute fracture is seen. Ligaments Suboptimally assessed by CT. Muscles and Tendons Normal size and density of the regional musculature. Soft tissues No right hip joint effusion. High-grade atherosclerotic calcifications. Mild-to-moderate right fat containing right inguinal hernia with apparent fluid at the distal aspect. IMPRESSION: 1. No acute fracture is seen. 2. Moderate right femoroacetabular osteoarthritis. 3. Mild-to-moderate right fat containing inguinal hernia. Electronically Signed   By: Neita Garnet M.D.   On: 06/07/2022 15:57   DG Hip Unilat W or Wo Pelvis 2-3 Views Right  Result Date: 06/07/2022 CLINICAL DATA:  Fall, brought in by ambulance for right upper leg pain since fall on 04/17 in [place]. Decreased range of motion. Difficulty walking. EXAM: DG HIP (WITH OR WITHOUT PELVIS) 2-3V RIGHT COMPARISON:  None Available. FINDINGS: There is no evidence of hip fracture or dislocation. Superolateral hip joint space narrowing with marginal spurring. Prominent vascular calcifications. IMPRESSION: 1. No acute fracture or dislocation. MRI examination for further evaluation is recommended if there is high clinical suspicion for a hip fracture. 2. Mild right hip osteoarthritis. Electronically Signed   By: Larose Hires D.O.   On: 06/07/2022 15:11    A/P: Dimitrius is a very pleasant 82 year old gentleman who was seen by my partner Dr. Drucie Ip for back buttock and neuropathic leg pain several days ago.  A day after being evaluated he had progressive back buttock and bilateral lower extremity neuropathic pain and subjective weakness.  As result he went to the emergency room and has been admitted for  further evaluation.  I was contacted by the hospitalist team for evaluation of his lumbar spine.  The patient's clinical exam demonstrates 5 out of 5 motor strength in the lower extremity when tested in the supine position.  He has significant pain when he attempts to stand and ambulate which affects his lower extremity strength.  Patient has normal rectal tone and perianal sensation.  He has a negative Babinski test, and intact sensation light touch.  He does have significant neuropathic leg pain right side worse than the left.  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  There is no acute fracture seen.  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  He has no signs or symptoms of a cauda equina syndrome either on his MRI or on clinical exam.  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  With respect to his lumbar spine the patient may require a lumbar decompression if he is unable to be mobilized or his pain gets progressively worse.  I spoke with Dr. Drucie Ip prior to evaluating the patient and we reviewed the MRI and he did not feel as though injection therapy would be beneficial given the severity of the spinal stenosis.  Before moving forward with surgery given his extensive medical history we would need to get appropriate clearances.  I will continue to monitor his progress.

## 2022-06-12 ENCOUNTER — Encounter (HOSPITAL_COMMUNITY): Payer: Self-pay | Admitting: Urology

## 2022-06-12 DIAGNOSIS — R339 Retention of urine, unspecified: Secondary | ICD-10-CM

## 2022-06-12 DIAGNOSIS — G9341 Metabolic encephalopathy: Secondary | ICD-10-CM | POA: Diagnosis not present

## 2022-06-12 DIAGNOSIS — I2510 Atherosclerotic heart disease of native coronary artery without angina pectoris: Secondary | ICD-10-CM | POA: Diagnosis not present

## 2022-06-12 DIAGNOSIS — N320 Bladder-neck obstruction: Secondary | ICD-10-CM

## 2022-06-12 LAB — FOLATE: Folate: 6.1 ng/mL (ref >5.9)

## 2022-06-12 LAB — BASIC METABOLIC PANEL
Anion gap: 4 — ABNORMAL LOW (ref 5–15)
BUN: 24 mg/dL — ABNORMAL HIGH (ref 8–23)
CO2: 26 mmol/L (ref 22–32)
Calcium: 7.8 mg/dL — ABNORMAL LOW (ref 8.9–10.3)
Chloride: 106 mmol/L (ref 98–111)
Creatinine, Ser: 1.65 mg/dL — ABNORMAL HIGH (ref 0.61–1.24)
GFR, Estimated: 41 mL/min — ABNORMAL LOW (ref >60)
Glucose, Bld: 99 mg/dL (ref 70–99)
Potassium: 4.5 mmol/L (ref 3.5–5.1)
Sodium: 136 mmol/L (ref 135–145)

## 2022-06-12 LAB — VITAMIN B12: Vitamin B-12: 198 pg/mL (ref 180–914)

## 2022-06-12 MED ORDER — ADULT MULTIVITAMIN W/MINERALS CH
1.0000 | ORAL_TABLET | Freq: Every day | ORAL | Status: DC
  Administered 2022-06-12 – 2022-06-15 (×4): 1 via ORAL
  Filled 2022-06-12 (×4): qty 1

## 2022-06-12 MED ORDER — CHLORHEXIDINE GLUCONATE CLOTH 2 % EX PADS
6.0000 | MEDICATED_PAD | Freq: Every day | CUTANEOUS | Status: DC
  Administered 2022-06-13 – 2022-06-15 (×3): 6 via TOPICAL

## 2022-06-12 MED ORDER — CYANOCOBALAMIN 1000 MCG/ML IJ SOLN
1000.0000 ug | Freq: Every day | INTRAMUSCULAR | Status: AC
  Administered 2022-06-12 – 2022-06-14 (×3): 1000 ug via INTRAMUSCULAR
  Filled 2022-06-12 (×3): qty 1

## 2022-06-12 MED ORDER — FOLIC ACID 1 MG PO TABS
1.0000 mg | ORAL_TABLET | Freq: Every day | ORAL | Status: DC
  Administered 2022-06-12 – 2022-06-15 (×4): 1 mg via ORAL
  Filled 2022-06-12 (×4): qty 1

## 2022-06-12 MED ORDER — VITAMIN B-12 1000 MCG PO TABS
1000.0000 ug | ORAL_TABLET | Freq: Every day | ORAL | Status: DC
  Administered 2022-06-15: 1000 ug via ORAL
  Filled 2022-06-12: qty 1

## 2022-06-12 MED ORDER — THIAMINE MONONITRATE 100 MG PO TABS
100.0000 mg | ORAL_TABLET | Freq: Every day | ORAL | Status: DC
  Administered 2022-06-12 – 2022-06-15 (×4): 100 mg via ORAL
  Filled 2022-06-12 (×5): qty 1

## 2022-06-12 NOTE — Progress Notes (Signed)
1 Day Post-Op Subjective: NAEON. Pt was A&O. No discomfort following his procedure.   Objective: Vital signs in last 24 hours: Temp:  [97.4 F (36.3 C)-98.3 F (36.8 C)] 97.7 F (36.5 C) (05/01 0629) Pulse Rate:  [53-66] 57 (05/01 0629) Resp:  [12-19] 16 (05/01 0629) BP: (108-153)/(58-84) 108/58 (05/01 0629) SpO2:  [88 %-100 %] 96 % (05/01 0833) FiO2 (%):  [2 %] 2 % (05/01 0629)  Intake/Output from previous day: 04/30 0701 - 05/01 0700 In: 1319.7 [P.O.:596; I.V.:723.7] Out: 2200 [Urine:2200]  Intake/Output this shift: No intake/output data recorded.  Physical Exam:  General: Alert and oriented CV: No cyanosis Lungs: equal chest rise Abdomen: Soft, NTND, no rebound or guarding Gu: Foley in place draining clear yellow urine  Lab Results: Recent Labs    06/10/22 1312 06/11/22 0551  HGB 15.0 14.6  HCT 43.4 43.8   BMET Recent Labs    06/11/22 0551 06/12/22 0539  NA 133* 136  K 4.0 4.5  CL 102 106  CO2 25 26  GLUCOSE 100* 99  BUN 33* 24*  CREATININE 1.73* 1.65*  CALCIUM 8.0* 7.8*     Studies/Results: ECHOCARDIOGRAM COMPLETE  Result Date: 06/11/2022    ECHOCARDIOGRAM REPORT   Patient Name:   Dave Silva. Date of Exam: 06/11/2022 Medical Rec #:  [id number]          Height:       71.0 in Accession #:    [id number]         Weight:       171.0 lb Date of Birth:  1940-12-10          BSA:          1.973 m Patient Age:    82 years           BP:           149/75 mmHg Patient Gender: M                  HR:           59 bpm. Exam Location:  Inpatient Procedure: 2D Echo, Cardiac Doppler and Color Doppler Indications:    syncope  History:        Patient has no prior history of Echocardiogram examinations.                 COPD; Risk Factors:Hypertension and Dyslipidemia.  Sonographer:    Mike Gip Referring Phys: [id number] [place] AGBATA IMPRESSIONS  1. Left ventricular ejection fraction, by estimation, is 55 to 60%. The left ventricle has normal function. The left  ventricle has no regional wall motion abnormalities. Left ventricular diastolic function could not be evaluated.  2. Right ventricular systolic function is normal. The right ventricular size is normal. There is mildly elevated pulmonary artery systolic pressure. The estimated right ventricular systolic pressure is 38.8 mmHg.  3. The mitral valve is degenerative. No evidence of mitral valve regurgitation. No evidence of mitral stenosis. Moderate mitral annular calcification.  4. Tricuspid valve regurgitation is moderate.  5. 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 The aortic valve is calcified. Aortic valve regurgitation is mild. Aortic valve area, by VTI measures 0.79 cm. Aortic valve  mean gradient measures 16.0 mmHg. FINDINGS  Left Ventricle: Left ventricular ejection fraction, by estimation, is 55 to 60%. The left ventricle has normal function. The left ventricle has no regional wall motion abnormalities. The left ventricular  internal cavity size was normal in size. There is  no left ventricular hypertrophy. Left ventricular diastolic function could not be evaluated due to mitral annular calcification (moderate or greater). Left ventricular diastolic function could not be evaluated. Right Ventricle: The right ventricular size is normal. No increase in right ventricular wall thickness. Right ventricular systolic function is normal. There is mildly elevated pulmonary artery systolic pressure. The tricuspid regurgitant velocity is 2.99  m/s, and with an assumed right atrial pressure of 3 mmHg, the estimated right ventricular systolic pressure is 38.8 mmHg. Left Atrium: Left atrial size was normal in size. Right Atrium: Right atrial size was normal in size. Pericardium: There is no evidence of pericardial effusion. Mitral Valve: The mitral valve is degenerative in appearance. Moderate mitral annular calcification. No evidence of  mitral valve regurgitation. No evidence of mitral valve stenosis. Tricuspid Valve: The tricuspid valve is normal in structure. Tricuspid valve regurgitation is moderate . No evidence of tricuspid stenosis. Aortic Valve: 2D AVA 1.88 cm2. 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 The aortic valve is calcified. Aortic valve regurgitation is mild. Aortic regurgitation PHT measures 400 msec. Aortic valve mean gradient measures 16.0 mmHg. Aortic valve peak gradient measures 24.3 mmHg. Aortic valve area, by VTI measures 0.79 cm. Pulmonic Valve: The pulmonic valve was not well visualized. Pulmonic valve regurgitation is mild. No evidence of pulmonic stenosis. Aorta: The aortic root and ascending aorta are structurally normal, with no evidence of dilitation. IAS/Shunts: No atrial level shunt detected by color flow Doppler.  LEFT VENTRICLE PLAX 2D LVIDd:         4.10 cm     Diastology LVIDs:         2.90 cm     LV e' medial:    5.55 cm/s LV PW:         1.00 cm     LV E/e' medial:  14.1 LV IVS:        1.00 cm     LV e' lateral:   8.27 cm/s LVOT diam:     1.80 cm     LV E/e' lateral: 9.5 LV SV:         49 LV SV Index:   25 LVOT Area:     2.54 cm  LV Volumes (MOD) LV vol d, MOD A2C: 90.0 ml LV vol d, MOD A4C: 82.1 ml LV vol s, MOD A2C: 39.1 ml LV vol s, MOD A4C: 31.3 ml LV SV MOD A2C:     50.9 ml LV SV MOD A4C:     82.1 ml LV SV MOD BP:      51.2 ml RIGHT VENTRICLE             IVC RV Basal diam:  3.40 cm     IVC diam: 1.10 cm RV S prime:     11.90 cm/s TAPSE (M-mode): 1.8 cm LEFT ATRIUM             Index        RIGHT ATRIUM           Index LA diam:        2.80 cm 1.42 cm/m   RA Area:     13.80 cm LA Vol (A2C):   40.1 ml 20.33 ml/m  RA Volume:   32.20 ml  16.32 ml/m LA Vol (A4C):   57.6 ml 29.20 ml/m LA Biplane Vol: 51.9 ml 26.31 ml/m  AORTIC VALVE  PULMONIC VALVE AV Area (Vmax):    0.83 cm      PR End Diast Vel: 3.76 msec AV Area  (Vmean):   0.76 cm AV Area (VTI):     0.79 cm AV Vmax:           246.33 cm/s AV Vmean:          177.667 cm/s AV VTI:            0.616 m AV Peak Grad:      24.3 mmHg AV Mean Grad:      16.0 mmHg LVOT Vmax:         80.20 cm/s LVOT Vmean:        53.100 cm/s LVOT VTI:          0.192 m LVOT/AV VTI ratio: 0.31 AI PHT:            400 msec  AORTA Ao Root diam: 3.50 cm Ao Asc diam:  3.30 cm MITRAL VALVE               TRICUSPID VALVE MV Area (PHT): 3.39 cm    TR Peak grad:   35.8 mmHg MV Decel Time: 224 msec    TR Vmax:        299.00 cm/s MV E velocity: 78.40 cm/s MV A velocity: 58.30 cm/s  SHUNTS MV E/A ratio:  1.34        Systemic VTI:  0.19 m                            Systemic Diam: 1.80 cm Riley Lam MD Electronically signed by Riley Lam MD Signature Date/Time: 06/11/2022/1:28:01 PM    Final    MR HIP RIGHT WO CONTRAST  Result Date: 06/10/2022 CLINICAL DATA:  Hip trauma, fracture suspected, xray done EXAM: MR OF THE RIGHT HIP WITHOUT CONTRAST TECHNIQUE: Multiplanar, multisequence MR imaging was performed. No intravenous contrast was administered. COMPARISON:  CT 06/07/2022 FINDINGS: Bones: No acute fracture. No dislocation. No femoral head avascular necrosis. Bony pelvis intact without diastasis. SI joints and pubic symphysis within normal limits. No bone marrow edema. No marrow replacing bone lesion. Articular cartilage and labrum Articular cartilage: Areas of moderate chondral thinning and surface irregularity, most pronounced at the anterosuperior aspect of the hip joint. No subchondral marrow signal changes. Labrum: Degenerated, not well assessed in the absence of intra-articular contrast. No paralabral cyst. Joint or bursal effusion Joint effusion:  None. Bursae: No abnormal bursal fluid collection. Muscles and tendons Muscles and tendons: Mild tendinosis of the bilateral gluteus medius and minimus tendons. The hamstring, iliopsoas, rectus femoris, and adductor tendons appear intact  without tear or significant tendinosis. Normal muscle bulk without atrophy or fatty infiltration. Other findings Miscellaneous: Mild generalized subcutaneous edema, nonspecific. No fluid collection. No inguinal lymphadenopathy. Trabeculated urinary bladder wall. Colonic diverticulosis. IMPRESSION: 1. No acute osseous abnormality of the right hip. 2. Moderate osteoarthritis of the right hip. 3. Mild tendinosis of the bilateral gluteus medius and minimus tendons. Electronically Signed   By: Duanne Guess D.O.   On: 06/10/2022 18:49   MR LUMBAR SPINE WO CONTRAST  Result Date: 06/10/2022 CLINICAL DATA:  Low back pain, increased fracture risk EXAM: MRI LUMBAR SPINE WITHOUT CONTRAST TECHNIQUE: Multiplanar, multisequence MR imaging of the lumbar spine was performed. No intravenous contrast was administered. COMPARISON:  None Available. FINDINGS: Segmentation:  Standard. Alignment: Grade 1 anterolisthesis of L4 on L5. Slight retrolisthesis at L5-S1. Mild lumbar levocurvature.  Vertebrae: No fracture, evidence of discitis, or bone lesion. Discogenic endplate marrow changes are most pronounced at L2-3 on the right and L5-S1 on the left. Chronic superior endplate Schmorl's node at L5 without associated marrow edema. Conus medullaris and cauda equina: Conus extends to the L1 level. Conus and cauda equina appear normal. Paraspinal and other soft tissues: Urinary bladder is moderately distended with trabeculated wall. No acute abnormality. Disc levels: T12-L1: No significant disc protrusion, foraminal stenosis, or canal stenosis. L1-L2: Minimal disc bulge and mild bilateral facet arthropathy. Borderline-mild canal stenosis. No significant foraminal stenosis. L2-L3: Disc bulge and endplate spurring, eccentric to the right. Moderate bilateral facet arthropathy with ligamentum flavum buckling. Moderate canal stenosis with right-sided subarticular recess stenosis. Mild right foraminal stenosis. L3-L4: Mild annular disc bulge  with advanced bilateral facet arthropathy and ligamentum flavum buckling. Moderate canal stenosis and mild bilateral foraminal stenosis. L4-L5: Disc uncovering with diffuse disc bulge. Severe bilateral facet arthropathy with ligamentum flavum buckling and thin synovial cyst at the midline. There is severe canal stenosis with bilateral subarticular recess stenosis. Moderate left foraminal stenosis. L5-S1: Disc bulge and endplate spurring with mild-to-moderate bilateral facet arthropathy. No canal stenosis or foraminal stenosis, however there is mass effect upon the exited bilateral L5 nerve roots by osteophytic endplate ridging. IMPRESSION: 1. 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 Moderate left foraminal stenosis at this level. 2. Moderate canal stenosis at L2-3 and L3-4. 3. Moderately distended urinary bladder with trabeculated wall, which may be secondary to chronic bladder outlet obstruction versus neurogenic bladder. Electronically Signed   By: Duanne Guess D.O.   On: 06/10/2022 18:40   CT HEAD WO CONTRAST ( )  Result Date: 06/10/2022 CLINICAL DATA:  Trauma, fall EXAM: CT HEAD WITHOUT CONTRAST TECHNIQUE: Contiguous axial images were obtained from the base of the skull through the vertex without intravenous contrast. RADIATION DOSE REDUCTION: This exam was performed according to the departmental dose-optimization program which includes automated exposure control, adjustment of the mA and/or kV according to patient size and/or use of iterative reconstruction technique. COMPARISON:  None Available. FINDINGS: Brain: No acute intracranial findings are seen. There are no signs of bleeding within the cranium. There is no focal mass effect. Cortical sulci are prominent. There is decreased density in periventricular white matter. Scattered arterial calcifications are seen. Vascular: Scattered arterial calcifications  are seen. Skull: No acute findings are seen. Sinuses/Orbits: There is mucosal thickening and possible small air-fluid level in right maxillary sinus. Other: None. IMPRESSION: No acute intracranial findings are seen. Atrophy. 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 Electronically Signed   By: Ernie Avena M.D.   On: 06/10/2022 15:11   DG Chest Portable 1 View  Result Date: 06/10/2022 CLINICAL DATA:  Weakness EXAM: PORTABLE CHEST 1 VIEW COMPARISON:  04/10/2020 FINDINGS: Sternal wires. Normal cardiopericardial silhouette. No pneumothorax or effusion. There is a density along the right side of the mediastinum inferiorly consistent with known history of a hiatal hernia. No edema or consolidation. Overlapping cardiac leads. IMPRESSION: Postop chest.  Hiatal hernia Electronically Signed   By: Karen Kays M.D.   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 53f catheter in place. Will plan on TOV tomorrow.    LOS: 2 days   Elmon Kirschner, NP [facility] Pager: [phone]  06/12/2022, 8:52 AM

## 2022-06-12 NOTE — Progress Notes (Addendum)
Rounding Note    Patient Name: Dave Silva. Date of Encounter: 06/12/2022  [facility] Cardiologist:    [facility]iac Specialist in [place]   Subjective   Feeling better today after procedure. Still feeling weak though and doesn't think he can walk.  Inpatient Medications    Scheduled Meds:  arformoterol  15 mcg Nebulization BID   And   umeclidinium bromide  1 puff Inhalation Daily   aspirin EC  81 mg Oral Daily   cholecalciferol  4,000 Units Oral Daily   irbesartan  75 mg Oral Daily   magnesium oxide  400 mg Oral Daily   montelukast  10 mg Oral QHS   pantoprazole  40 mg Oral Daily   predniSONE  30 mg Oral Q breakfast   Followed by   Melene Muller ON 06/13/2022] predniSONE  20 mg Oral Q breakfast   Followed by   Melene Muller ON 06/14/2022] predniSONE  10 mg Oral Q breakfast   rosuvastatin  40 mg Oral Daily   Continuous Infusions:  PRN Meds: albuterol, fluticasone, ondansetron **OR** ondansetron (ZOFRAN) IV, oxyCODONE   Vital Signs    Vitals:   06/11/22 1751 06/11/22 2114 06/11/22 2118 06/12/22 0629  BP: (!) 140/79 114/72  (!) 108/58  Pulse: 61 66  (!) 57  Resp: 16 18  16   Temp: (!) 97.4 F (36.3 C) 97.7 F (36.5 C)  97.7 F (36.5 C)  TempSrc:  Oral  Axillary  SpO2: 91% 90% 96% 95%  Weight:      Height:        Intake/Output Summary (Last 24 hours) at 06/12/2022 0800 Last data filed at 06/12/2022 0640 Gross per 24 hour  Intake 1319.74 ml  Output 2200 ml  Net -880.26 ml      06/10/2022    1:01 PM 06/07/2022    1:44 PM 07/14/2020    3:55 PM  Last 3 Weights  Weight (lbs) 171 lb 171 lb 15.3 oz 174 lb  Weight (kg) 77.565 kg 78 kg 78.926 kg      Telemetry    NSR HR 60s - Personally Reviewed  ECG    Normal sinus rhythm heart rate 60  - Personally Reviewed  Physical Exam   GEN: No acute distress.   Neck: No JVD Cardiac: RRR, 3/6 systolic murmur, rubs, or gallops.  Respiratory: Clear to auscultation bilaterally. GI: Soft, nontender, non-distended   MS: No edema; No deformity. Neuro:  Nonfocal  Psych: Normal affect   Labs    High Sensitivity Troponin:  No results for input(s): "TROPONINIHS" in the last 720 hours.   Chemistry Recent Labs  Lab 06/10/22 1312 06/11/22 0551 06/12/22 0539  NA 127* 133* 136  K 4.5 4.0 4.5  CL 98 102 106  CO2 21* 25 26  GLUCOSE 100* 100* 99  BUN 31* 33* 24*  CREATININE 1.54* 1.73* 1.65*  CALCIUM 8.4* 8.0* 7.8*  PROT 6.4*  --   --   ALBUMIN 3.4*  --   --   AST 19  --   --   ALT 15  --   --   ALKPHOS 45  --   --   BILITOT 0.8  --   --   GFRNONAA 45* 39* 41*  ANIONGAP 8 6 4*    Lipids No results for input(s): "CHOL", "TRIG", "HDL", "LABVLDL", "LDLCALC", "CHOLHDL" in the last 168 hours.  Hematology Recent Labs  Lab 06/10/22 1312 06/11/22 0551  WBC 9.8 8.9  RBC 4.29 4.26  HGB  15.0 14.6  HCT 43.4 43.8  MCV 101.2* 102.8*  MCH 35.0* 34.3*  MCHC 34.6 33.3  RDW 13.3 13.3  PLT 137* 136*   Thyroid  Recent Labs  Lab 06/10/22 2114  TSH 1.989    BNPNo results for input(s): "BNP", "PROBNP" in the last 168 hours.  DDimer No results for input(s): "DDIMER" in the last 168 hours.   Radiology    ECHOCARDIOGRAM COMPLETE  Result Date: 06/11/2022    ECHOCARDIOGRAM REPORT   Patient Name:   Dave Silva. Date of Exam: 06/11/2022 Medical Rec #:  [id number]          Height:       71.0 in Accession #:    [id number]         Weight:       171.0 lb Date of Birth:  [birth year]          BSA:          1.973 m Patient Age:    82 years           BP:           149/75 mmHg Patient Gender: M                  HR:           59 bpm. Exam Location:  Inpatient Procedure: 2D Echo, Cardiac Doppler and Color Doppler Indications:    syncope  History:        Patient has no prior history of Echocardiogram examinations.                 COPD; Risk Factors:Hypertension and Dyslipidemia.  Sonographer:    Mike Gip Referring Phys: [id number] [place] AGBATA IMPRESSIONS  1. Left ventricular ejection fraction, by estimation, is 55  to 60%. The left ventricle has normal function. The left ventricle has no regional wall motion abnormalities. Left ventricular diastolic function could not be evaluated.  2. Right ventricular systolic function is normal. The right ventricular size is normal. There is mildly elevated pulmonary artery systolic pressure. The estimated right ventricular systolic pressure is 38.8 mmHg.  3. The mitral valve is degenerative. No evidence of mitral valve regurgitation. No evidence of mitral stenosis. Moderate mitral annular calcification.  4. Tricuspid valve regurgitation is moderate.  5. 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 The aortic valve is calcified. Aortic valve regurgitation is mild. Aortic valve area, by VTI measures 0.79 cm. Aortic valve  mean gradient measures 16.0 mmHg. FINDINGS  Left Ventricle: Left ventricular ejection fraction, by estimation, is 55 to 60%. The left ventricle has normal function. The left ventricle has no regional wall motion abnormalities. The left ventricular internal cavity size was normal in size. There is  no left ventricular hypertrophy. Left ventricular diastolic function could not be evaluated due to mitral annular calcification (moderate or greater). Left ventricular diastolic function could not be evaluated. Right Ventricle: The right ventricular size is normal. No increase in right ventricular wall thickness. Right ventricular systolic function is normal. There is mildly elevated pulmonary artery systolic pressure. The tricuspid regurgitant velocity is 2.99  m/s, and with an assumed right atrial pressure of 3 mmHg, the estimated right ventricular systolic pressure is 38.8 mmHg. Left Atrium: Left atrial size was normal in size. Right Atrium: Right atrial size was normal in size. Pericardium: There is no evidence of pericardial effusion. Mitral Valve: The mitral valve is  degenerative in appearance.  Moderate mitral annular calcification. No evidence of mitral valve regurgitation. No evidence of mitral valve stenosis. Tricuspid Valve: The tricuspid valve is normal in structure. Tricuspid valve regurgitation is moderate . No evidence of tricuspid stenosis. Aortic Valve: 2D AVA 1.88 cm2. 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 The aortic valve is calcified. Aortic valve regurgitation is mild. Aortic regurgitation PHT measures 400 msec. Aortic valve mean gradient measures 16.0 mmHg. Aortic valve peak gradient measures 24.3 mmHg. Aortic valve area, by VTI measures 0.79 cm. Pulmonic Valve: The pulmonic valve was not well visualized. Pulmonic valve regurgitation is mild. No evidence of pulmonic stenosis. Aorta: The aortic root and ascending aorta are structurally normal, with no evidence of dilitation. IAS/Shunts: No atrial level shunt detected by color flow Doppler.  LEFT VENTRICLE PLAX 2D LVIDd:         4.10 cm     Diastology LVIDs:         2.90 cm     LV e' medial:    5.55 cm/s LV PW:         1.00 cm     LV E/e' medial:  14.1 LV IVS:        1.00 cm     LV e' lateral:   8.27 cm/s LVOT diam:     1.80 cm     LV E/e' lateral: 9.5 LV SV:         49 LV SV Index:   25 LVOT Area:     2.54 cm  LV Volumes (MOD) LV vol d, MOD A2C: 90.0 ml LV vol d, MOD A4C: 82.1 ml LV vol s, MOD A2C: 39.1 ml LV vol s, MOD A4C: 31.3 ml LV SV MOD A2C:     50.9 ml LV SV MOD A4C:     82.1 ml LV SV MOD BP:      51.2 ml RIGHT VENTRICLE             IVC RV Basal diam:  3.40 cm     IVC diam: 1.10 cm RV S prime:     11.90 cm/s TAPSE (M-mode): 1.8 cm LEFT ATRIUM             Index        RIGHT ATRIUM           Index LA diam:        2.80 cm 1.42 cm/m   RA Area:     13.80 cm LA Vol (A2C):   40.1 ml 20.33 ml/m  RA Volume:   32.20 ml  16.32 ml/m LA Vol (A4C):   57.6 ml 29.20 ml/m LA Biplane Vol: 51.9 ml 26.31 ml/m  AORTIC VALVE                     PULMONIC VALVE AV Area (Vmax):     0.83 cm      PR End Diast Vel: 3.76 msec AV Area (Vmean):   0.76 cm AV Area (VTI):     0.79 cm AV Vmax:           246.33 cm/s AV Vmean:          177.667 cm/s AV VTI:            0.616 m AV Peak Grad:      24.3 mmHg AV Mean Grad:      16.0 mmHg LVOT Vmax:  80.20 cm/s LVOT Vmean:        53.100 cm/s LVOT VTI:          0.192 m LVOT/AV VTI ratio: 0.31 AI PHT:            400 msec  AORTA Ao Root diam: 3.50 cm Ao Asc diam:  3.30 cm MITRAL VALVE               TRICUSPID VALVE MV Area (PHT): 3.39 cm    TR Peak grad:   35.8 mmHg MV Decel Time: 224 msec    TR Vmax:        299.00 cm/s MV E velocity: 78.40 cm/s MV A velocity: 58.30 cm/s  SHUNTS MV E/A ratio:  1.34        Systemic VTI:  0.19 m                            Systemic Diam: 1.80 cm Riley Lam MD Electronically signed by Riley Lam MD Signature Date/Time: 06/11/2022/1:28:01 PM    Final    MR HIP RIGHT WO CONTRAST  Result Date: 06/10/2022 CLINICAL DATA:  Hip trauma, fracture suspected, xray done EXAM: MR OF THE RIGHT HIP WITHOUT CONTRAST TECHNIQUE: Multiplanar, multisequence MR imaging was performed. No intravenous contrast was administered. COMPARISON:  CT 06/07/2022 FINDINGS: Bones: No acute fracture. No dislocation. No femoral head avascular necrosis. Bony pelvis intact without diastasis. SI joints and pubic symphysis within normal limits. No bone marrow edema. No marrow replacing bone lesion. Articular cartilage and labrum Articular cartilage: Areas of moderate chondral thinning and surface irregularity, most pronounced at the anterosuperior aspect of the hip joint. No subchondral marrow signal changes. Labrum: Degenerated, not well assessed in the absence of intra-articular contrast. No paralabral cyst. Joint or bursal effusion Joint effusion:  None. Bursae: No abnormal bursal fluid collection. Muscles and tendons Muscles and tendons: Mild tendinosis of the bilateral gluteus medius and minimus tendons. The hamstring, iliopsoas, rectus  femoris, and adductor tendons appear intact without tear or significant tendinosis. Normal muscle bulk without atrophy or fatty infiltration. Other findings Miscellaneous: Mild generalized subcutaneous edema, nonspecific. No fluid collection. No inguinal lymphadenopathy. Trabeculated urinary bladder wall. Colonic diverticulosis. IMPRESSION: 1. No acute osseous abnormality of the right hip. 2. Moderate osteoarthritis of the right hip. 3. Mild tendinosis of the bilateral gluteus medius and minimus tendons. Electronically Signed   By: Duanne Guess D.O.   On: 06/10/2022 18:49   MR LUMBAR SPINE WO CONTRAST  Result Date: 06/10/2022 CLINICAL DATA:  Low back pain, increased fracture risk EXAM: MRI LUMBAR SPINE WITHOUT CONTRAST TECHNIQUE: Multiplanar, multisequence MR imaging of the lumbar spine was performed. No intravenous contrast was administered. COMPARISON:  None Available. FINDINGS: Segmentation:  Standard. Alignment: Grade 1 anterolisthesis of L4 on L5. Slight retrolisthesis at L5-S1. Mild lumbar levocurvature. Vertebrae: No fracture, evidence of discitis, or bone lesion. Discogenic endplate marrow changes are most pronounced at L2-3 on the right and L5-S1 on the left. Chronic superior endplate Schmorl's node at L5 without associated marrow edema. Conus medullaris and cauda equina: Conus extends to the L1 level. Conus and cauda equina appear normal. Paraspinal and other soft tissues: Urinary bladder is moderately distended with trabeculated wall. No acute abnormality. Disc levels: T12-L1: No significant disc protrusion, foraminal stenosis, or canal stenosis. L1-L2: Minimal disc bulge and mild bilateral facet arthropathy. Borderline-mild canal stenosis. No significant foraminal stenosis. L2-L3: Disc bulge and endplate spurring, eccentric to the right. Moderate bilateral  facet arthropathy with ligamentum flavum buckling. Moderate canal stenosis with right-sided subarticular recess stenosis. Mild right foraminal  stenosis. L3-L4: Mild annular disc bulge with advanced bilateral facet arthropathy and ligamentum flavum buckling. Moderate canal stenosis and mild bilateral foraminal stenosis. L4-L5: Disc uncovering with diffuse disc bulge. Severe bilateral facet arthropathy with ligamentum flavum buckling and thin synovial cyst at the midline. There is severe canal stenosis with bilateral subarticular recess stenosis. Moderate left foraminal stenosis. L5-S1: Disc bulge and endplate spurring with mild-to-moderate bilateral facet arthropathy. No canal stenosis or foraminal stenosis, however there is mass effect upon the exited bilateral L5 nerve roots by osteophytic endplate ridging. IMPRESSION: 1. 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 Moderate left foraminal stenosis at this level. 2. Moderate canal stenosis at L2-3 and L3-4. 3. Moderately distended urinary bladder with trabeculated wall, which may be secondary to chronic bladder outlet obstruction versus neurogenic bladder. Electronically Signed   By: Duanne Guess D.O.   On: 06/10/2022 18:40   CT HEAD WO CONTRAST ( )  Result Date: 06/10/2022 CLINICAL DATA:  Trauma, fall EXAM: CT HEAD WITHOUT CONTRAST TECHNIQUE: Contiguous axial images were obtained from the base of the skull through the vertex without intravenous contrast. RADIATION DOSE REDUCTION: This exam was performed according to the departmental dose-optimization program which includes automated exposure control, adjustment of the mA and/or kV according to patient size and/or use of iterative reconstruction technique. COMPARISON:  None Available. FINDINGS: Brain: No acute intracranial findings are seen. There are no signs of bleeding within the cranium. There is no focal mass effect. Cortical sulci are prominent. There is decreased density in periventricular white matter. Scattered arterial calcifications are seen.  Vascular: Scattered arterial calcifications are seen. Skull: No acute findings are seen. Sinuses/Orbits: There is mucosal thickening and possible small air-fluid level in right maxillary sinus. Other: None. IMPRESSION: No acute intracranial findings are seen. Atrophy. 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 Electronically Signed   By: Ernie Avena M.D.   On: 06/10/2022 15:11   DG Chest Portable 1 View  Result Date: 06/10/2022 CLINICAL DATA:  Weakness EXAM: PORTABLE CHEST 1 VIEW COMPARISON:  04/10/2020 FINDINGS: Sternal wires. Normal cardiopericardial silhouette. No pneumothorax or effusion. There is a density along the right side of the mediastinum inferiorly consistent with known history of a hiatal hernia. No edema or consolidation. Overlapping cardiac leads. IMPRESSION: Postop chest.  Hiatal hernia Electronically Signed   By: Karen Kays M.D.   On: 06/10/2022 13:58    Cardiac Studies   Echocardiogram 06/11/2022 1. Left ventricular ejection fraction, by estimation, is 55 to 60%. The  left ventricle has normal function. The left ventricle has no regional  wall motion abnormalities. Left ventricular diastolic function could not  be evaluated.   2. Right ventricular systolic function is normal. The right ventricular  size is normal. There is mildly elevated pulmonary artery systolic  pressure. The estimated right ventricular systolic pressure is 38.8 mmHg.   3. The mitral valve is degenerative. No evidence of mitral valve  regurgitation. No evidence of mitral stenosis. Moderate mitral annular  calcification.   4. Tricuspid valve regurgitation is moderate.   5. 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 The  aortic valve is calcified. Aortic valve regurgitation is mild. Aortic  valve area, by VTI measures 0.79 cm. Aortic valve   mean gradient measures 16.0 mmHg.  Patient  Profile     82 y.o. male hx of CABG x 4 in 2010, PVCs, COPD, GERD, prostate cancer, hypertension, hyperlipidemia, mild aortic stenosis, carotid artery stenosis who was admitted for the evaluation of syncope on prior admission and weakness.   Assessment & Plan    Syncope with subsequent episode of persistent weakness Bradycardia  Patient with recent hospital admission 2 weeks ago for episode of syncope that was thought to be vasovagal due to to a sneezing fit, after the incident patient reports having normal strength.  He has returned back to the hospital with episode of weakness that has persisted.  There has been no evidence of any underlying cardiac cause, but likely more attributed to urinary retention. Patient had normal echocardiogram results with normal EF and no significant valvular issues.  He had moderate TR and no significant changes in his AS mean gradient.. Consider Holter monitor at discharge for longer duration at discharge to rule out any arrhythmias Continue to correct electrolyte imbalances and hydration status per primary team Previous holter results: 805 SVE's with 30 couplets and 27 atrial runs, longest 11 beats rate 119 and fastest 3 beats rate 164. There were 4,150 PVCs with 161 couplets. Short runs of trigeminy  Preoperative evaluation for spinal decompression Patient with previous history of CABG x 4 in 2010 with overall stable disease without any complaints of chest pain or shortness of breath.  RCRI score equals 1 indicating class II risk with 6% probability of MACE.  Previously, patient able to complete at least 4 METS prior to weakness.  Overall patient has low to moderate risk of an adverse cardiac event.  Given recent echocardiogram and stable coronary artery disease, patient is at low to moderate risk for upcoming surgery and no indication for further workup.   Hx of carotid artery stenosis No bruits on exam, can consider carotid US   Mild aortic stenosis/ moderate  TR Previous Aortic valve mean gradient of 13.  Now mean gradient measures 16.0 mmHg. No significant change and clinically not significant.    CAD status post CABG x 4 in 2010 Stable, no complaints of chest pain or SOB. Primary team can resume aspirin once fractures have been rule out.  Continue with rosuvastatin 40 mg daily   Hypertension Continue irbesartan 75 mg daily   Hyperlipidemia Continue rosuvastatin 40 mg daily   AKI Hyponatremia repleted and back to normal limits Right hip pain Urinary retention Per primary team  *No further work up needed from a cardiology perspective, therefore cardiology will be signing off. Please reach out if there are any questions or concerns.   For questions or updates, please contact [facility] Please consult [email or web address] for contact info under        Signed, Abagail Kitchens, PA-C  06/12/2022, 8:00 AM     Personally seen and examined. Agree with above.  Agree that he may proceed with spinal decompression surgery with no other cardiac testing and he will be of mild to moderate cardiac risk given his prior CABG in 2010.  He has mild aortic stenosis as well as a normal ejection fraction.  This should not be of major clinical concern.  He may proceed with surgery from a cardiac perspective.  Please let us know if we can be of further assistance.  We will go ahead and sign off.  Donato Schultz, MD

## 2022-06-12 NOTE — Evaluation (Signed)
Physical Therapy Evaluation Patient Details Name: Dave Silva. MRN: [id number] DOB: 03/08/1940 Today's Date: 06/12/2022  History of Present Illness  82 y/o male admitted 06/10/22 with fall, weakness, and leg pain. Dx with acute metabolic encephalopathy. There is some concern for neurologic impingement (ortho following) and he was found to be in urinary retention. Now s/p Cystoscopy with urethral dilatation. Orthopedics following for possible surgery. PMH includes asthma/COPD, hypertension, dyslipidemia, coronary artery disease status post CABG x 4, Cancer.  Clinical Impression  On eval, pt required Min A to stand and take a few steps around the room. Would highly recommend +2 for ambulation safety due to high fall risk. Maximal effort for minimal activity during session. Pt rated pain 6/10 with activity and 2/10 at rest (pain appears higher than rating based off of patient's presentation--grunting, shaking, dyspnea). O2 87% on RA with activity. Orthopedics if following with possible plan for back surgery per chart review. Will plan to follow pt during hospital stay.      Recommendations for follow up therapy are one component of a multi-disciplinary discharge planning process, led by the attending physician.  Recommendations may be updated based on patient status, additional functional criteria and insurance authorization.  Follow Up Recommendations Can patient physically be transported by private vehicle: No     Assistance Recommended at Discharge Frequent or constant Supervision/Assistance  Patient can return home with the following  Assist for transportation;Help with stairs or ramp for entrance;Assistance with cooking/housework;Two people to help with walking and/or transfers;A lot of help with bathing/dressing/bathroom    Equipment Recommendations Rolling walker (2 wheels)  Recommendations for Other Services  OT consult    Functional Status Assessment Patient has had a recent  decline in their functional status and demonstrates the ability to make significant improvements in function in a reasonable and predictable amount of time.     Precautions / Restrictions Precautions Precaution Comments: back precautions for comfort Restrictions Weight Bearing Restrictions: No      Mobility  Bed Mobility               General bed mobility comments: oob in recliner    Transfers Overall transfer level: Needs assistance Equipment used: Rolling walker (2 wheels) Transfers: Sit to/from Stand  Sit to Stand: Min Assist           General transfer comment: Assist to power up, stabilize, control descent. Cues for safety, technique. Increased time.    Ambulation/Gait Ambulation/Gait assistance: Min assist Gait Distance (Feet): 5 Feet Assistive device: Rolling walker (2 wheels) Gait Pattern/deviations: Antalgic, Decreased step length - right, Decreased step length - left       General Gait Details: Gait significantly antalgic. High fall risk. Pt only able to tolerate taking a few steps in room with RW. Assist to stablize throughout short distance. O2 87% on RA, dyspnea 3/4. Max effort for minimal activity.  Stairs            Wheelchair Mobility    Modified Rankin (Stroke Patients Only)       Balance Overall balance assessment: Needs assistance         Standing balance support: Bilateral upper extremity supported, Reliant on assistive device for balance, During functional activity                                 Pertinent Vitals/Pain Pain Assessment Pain Score: 6  (pain appears higher than pt rated numerically) Pain  Location: back and with standing radiates down leg and butt Pain Descriptors / Indicators: Discomfort, Grimacing, Guarding, Radiating, Shooting Pain Intervention(s): Monitored during session, Repositioned    Home Living                          Prior Function                       Hand  Dominance        Extremity/Trunk Assessment   Upper Extremity Assessment Upper Extremity Assessment: Defer to OT evaluation    Lower Extremity Assessment Lower Extremity Assessment: Generalized weakness    Cervical / Trunk Assessment Cervical / Trunk Assessment: Normal  Communication      Cognition Arousal/Alertness: Awake/alert Behavior During Therapy: WFL for tasks assessed/performed Overall Cognitive Status: Within Functional Limits for tasks assessed                                 General Comments: very cooperative, very safety aware        General Comments      Exercises     Assessment/Plan    PT Assessment Patient needs continued PT services  PT Problem List Decreased strength;Decreased range of motion;Decreased activity tolerance;Decreased balance;Decreased mobility;Pain;Decreased knowledge of use of DME       PT Treatment Interventions DME instruction;Gait training;Therapeutic exercise;Balance training;Functional mobility training;Therapeutic activities;Patient/family education    PT Goals (Current goals can be found in the Care Plan section)  Acute Rehab PT Goals Patient Stated Goal: less pain. regain PLOF/independence PT Goal Formulation: With patient Time For Goal Achievement: 06/26/22 Potential to Achieve Goals: Good    Frequency Min 1X/week     Co-evaluation               AM-PAC PT "6 Clicks" Mobility  Outcome Measure Help needed turning from your back to your side while in a flat bed without using bedrails?: A Lot Help needed moving from lying on your back to sitting on the side of a flat bed without using bedrails?: A Lot Help needed moving to and from a bed to a chair (including a wheelchair)?: A Little Help needed standing up from a chair using your arms (e.g., wheelchair or bedside chair)?: A Little Help needed to walk in hospital room?: A Lot Help needed climbing 3-5 steps with a railing? : Total 6 Click Score:  13    End of Session Equipment Utilized During Treatment: Gait belt Activity Tolerance: Patient limited by pain;Patient limited by fatigue Patient left: in chair;with family/visitor present   PT Visit Diagnosis: Pain;Difficulty in walking, not elsewhere classified (R26.2);History of falling (Z91.81);Repeated falls (R29.6) Pain - part of body: Leg (back, bil LEs)    Time: 4098-1191 PT Time Calculation (min) (ACUTE ONLY): 23 min   Charges:   PT Evaluation $PT Eval Low Complexity: 1 Low             Faye Ramsay, PT Acute Rehabilitation  Office: [phone]

## 2022-06-12 NOTE — Progress Notes (Signed)
    Subjective: Procedure(s) (LRB): CYSTOSCOPY WITH URETHRAL DILATATION AND FOLEY CATHETER PLACEMENT (N/A) 1 Day Post-Op  Patient reports pain as 7 on 0-10 scale.  Reports No radicular leg pain when supine.  Positive pain when Attempting to stand Positive void - foley intake Positive bowel movement Positive flatus Negative chest pain or shortness of breath  Objective: Vital signs in last 24 hours: Temp:  [97.4 F (36.3 C)-98.3 F (36.8 C)] 97.7 F (36.5 C) (05/01 0629) Pulse Rate:  [53-66] 57 (05/01 0629) Resp:  [12-19] 16 (05/01 0629) BP: (108-153)/(58-84) 108/58 (05/01 0629) SpO2:  [88 %-100 %] 95 % (05/01 0629) FiO2 (%):  [2 %] 2 % (05/01 0629)  Intake/Output from previous day: 04/30 0701 - 05/01 0700 In: 1319.7 [P.O.:596; I.V.:723.7] Out: 2200 [Urine:2200]  Labs: Recent Labs    06/10/22 1312 06/11/22 0551  WBC 9.8 8.9  RBC 4.29 4.26  HCT 43.4 43.8  PLT 137* 136*   Recent Labs    06/11/22 0551 06/12/22 0539  NA 133* 136  K 4.0 4.5  CL 102 106  CO2 25 26  BUN 33* 24*  CREATININE 1.73* 1.65*  GLUCOSE 100* 99  CALCIUM 8.0* 7.8*   No results for input(s): "LABPT", "INR" in the last 72 hours.  Physical Exam: Neurologically intact Intact pulses distally Dorsiflexion/Plantar flexion intact Compartment soft Body mass index is 23.85 kg/m.   Assessment/Plan: Patient stable  Recommend PT eval today.   If patient unable to mobilize due to pain then will plan on moving forward with lumabr decompression and fusion provided he is medically cleared.   Patient in agreement with plan.   Will continue to monitor progress.  Venita Lick, MD [facility] [phone]

## 2022-06-12 NOTE — Progress Notes (Signed)
[facility] PROGRESS NOTE  Dave Silva. (DOB: 1941/01/26) ONG:[id number] PCP: Patient, No Pcp Per  Brief Narrative: Dave Silva. is an 82 y.o. male with a history of CAD s/p 4v CABG 2010, asthma/COPD, HTN, dyslipidemia, prostate CA treated w/radiation who presented to the ED on 06/10/2022 with worsening leg weakness, falls and low back pain. There was concern during a trip to [place] for syncope and fall onto right hip/back with subsequently negative radiographic evaluation for fracture. On follow up with orthopedics 4/28 he was prescribed oxycodone, gabapentin, and prednisone, but presented to the ED the next day with worse pain and its impairing his ability to ambulate. In the ED he was afebrile with intact sensation and strength in lower extremities, ECG with NSR, incomplete RBBB. SCr 1.54 from presumptive baseline of 0.93, BUN 31, sodium 127. IV fluids were given and admission was requested. CT head showed atrophy, small vessel disease, acute/chronic maxillary sinusitis without acute intracranial findings. Subsequent MRI of the lumbar spine and right hip revealed no fracture or dislocation. There is severe canal stenosis at L4 level for which orthopedics/spine surgery is consulted. Cardiology was consulted, suspecting vasovagal etiology to passing out episode previously and, after echo showed preserved LV systolic function and aortic stenosis, recommended outpatient cardiac monitoring after discharge.   Foley was placed in OR after traversing pinhole urethral stricture 4/30, TOV planned 5/2. Renal function remains abnormal though not worsening. PT evaluation is pending to inform urgency for lumbar decompression.  Subjective: No pain at rest, but severe, debilitating low back pain when trying to get up. Thinks he can't walk, but hasn't gotten OOB yet. Likes eating fruit and vegetables.   Objective: BP (!) 108/58 (BP Location: Left Arm)   Pulse (!) 57   Temp 97.7 F (36.5 C)  (Axillary)   Resp 16   Ht 5\' 11"  (1.803 m)   Wt 77.6 kg   SpO2 96%   BMI 23.85 kg/m   Gen: No distress Pulm: Clear, nonlabored  CV: RRR, soft systolic murmur at the bas, no JVD or edema GI: Soft, NT, ND, +BS Neuro: Alert and oriented. No new focal deficits in strength or sensation. Ext: Warm, no deformities. Skin: No new rashes, lesions or ulcers on visualized skin   Assessment & Plan: Principal Problem:   Acute metabolic encephalopathy Active Problems:   AKI (acute kidney injury) (HCC)   Hyponatremia   Syncope, vasovagal   Acute right hip pain   Stage 1 mild COPD by GOLD classification (HCC)   CAD s/p CABGx4 in 2010   GERD (gastroesophageal reflux disease)   Major depression in remission [facility])   Essential hypertension   Bladder neck stricture   Urinary retention  Acute metabolic encephalopathy on memory impairment: Chronic without formal diagnosis. Retired Tax adviser, PhD Environmental manager. Multifactorial abrupt deterioration inclusive of multiple medications.  - Continue oxycodone prn pain.  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  - Appreciate spine surgery, Dr. Shon Baton evaluating the patient. No urgent surgery required, no cauda equina syndrome. May need decompression surgically if unable to ambulate. PT/OT evaluations pending.  - Prednisone taper ordered for pain control.  Acute urinary retention, urethral stricture: Imaging with trabeculations in bladder, retaining >450cc postvoid. Unable to place foley bedside, taken to OR 4/30 where dense pinhole stricture confirmed during cystoscopy and balloon dilated, 51f catheter placed.  - TOV 5/2 per urology.    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  -  Improved with relief of bladder obstruction.   - UA bland w/u RBCs, casts, protein.  - Can stop IVF once taking adequate po.   Hyponatremia: Improving  HTN: Valsartan 80mg  per PCP note (in [place], Dr.  Eldridge Scot) - Continue formulary ARB.   GERD:  - Continue PPI, EMR shows plan for outpatient EGD.   Asthma/COPD: Quiescent. PFT June 2022: FVC 3.64 (86%), FEV1 2.53 (84%), ratio 69, TLC 4.35 (59%), DLCOunc 7.68 (30%) corrects to 58% with lung volumes/ Minimal obstruction without BD response. Moderate-severe diffusion defect.  - Followed by Dr. Sherene Sires, on singulair and stiolto respimat (continue LAMA, LABA formulary) and prn albuterol.   CAD s/p CABG, HLD: No anginal complaints, no ischemic ECG features. No WMA on echo, LVEF 55-60%, normal RV - Continue ASA 81mg , rosuvastatin 40mg   Syncope: Vasovagal seeming.  - Appreciate cardiology evaluation. Will keep on cardiac monitoring and arrange zio patch at DC.   Depression: Quiescent.  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 Stable on recheck, no bleeding. Possibly due to vitamin deficiencies.  Macrocytosis: Low-normal vitamin levels.  - Start thiamine, folate, and MVM/mineral supplement orally. Start IM B12 while in house, then convert to po.  - No bleeding, continue monitoring  Tyrone Nine, MD [facility] [email or web address] 06/12/2022, 9:52 AM

## 2022-06-12 NOTE — Evaluation (Signed)
Occupational Therapy Evaluation Patient Details Name: Dave Silva. MRN: [id number] DOB: July 21, 1940 Today's Date: 06/12/2022   History of Present Illness Dave Silva is an 82 y/o male admitted 06/10/22 with fall, weakness, and leg pain. Dx with acute metabolic encephalopathy. There is some concern for neurologic impingement (ortho following) and he was found to be in urinary retention. Now s/p Cystoscopy with urethral dilatation. PMH includes asthma/COPD, hypertension, dyslipidemia, coronary artery disease status post CABG x 4, Cancer.   Clinical Impression   Dave Silva is typically independent in ADL and mobility. Ever since he fell in [place] he has been using a RW for support. Dave Silva today is min A for bed mobility (educated in log roll for comfort and pain management) Dave Silva unable to access RLE for ADL, very limited distance for mobility in room today chair>bed with min A and dependent on RW. Dave Silva requires seated position for grooming when he would typically stand. At this time Dave Silva is eager/anxious to have lumbar spine sx and "fix the problem" If team does not decide to proceed with sx Dave Silva will require post-acute rehab <3 hours daily to return to PLOF (independent). OT will continue to follow acutely.       Recommendations for follow up therapy are one component of a multi-disciplinary discharge planning process, led by the attending physician.  Recommendations may be updated based on patient status, additional functional criteria and insurance authorization.   Assistance Recommended at Discharge Frequent or constant Supervision/Assistance  Patient can return home with the following A lot of help with walking and/or transfers;A lot of help with bathing/dressing/bathroom;Assistance with cooking/housework;Assist for transportation;Help with stairs or ramp for entrance    Functional Status Assessment  Patient has had a recent decline in their functional status and demonstrates the ability to make significant  improvements in function in a reasonable and predictable amount of time.  Equipment Recommendations  Other (comment) (defer to next venue of care)    Recommendations for Other Services Dave Silva consult     Precautions / Restrictions Precautions Precaution Comments: back precautions for comfort Restrictions Weight Bearing Restrictions: No      Mobility Bed Mobility Overal bed mobility: Needs Assistance Bed Mobility: Rolling, Sidelying to Sit Rolling: Min guard Sidelying to sit: Min assist       General bed mobility comments: educated in log roll technique for comfort and pain management. used bed rails and assist for trunk elevation    Transfers Overall transfer level: Needs assistance Equipment used: Rolling walker (2 wheels) Transfers: Sit to/from Stand, Bed to chair/wheelchair/BSC Sit to Stand: Min assist     Step pivot transfers: Min assist     General transfer comment: dependent on RW in standing for balance and pain management      Balance Overall balance assessment: Needs assistance Sitting-balance support: No upper extremity supported, Feet supported Sitting balance-Leahy Scale: Fair Sitting balance - Comments: EOB, dynamic leaning balance for attempt at LB dressing   Standing balance support: Bilateral upper extremity supported, Reliant on assistive device for balance Standing balance-Leahy Scale: Poor                             ADL either performed or assessed with clinical judgement   ADL Overall ADL's : Needs assistance/impaired Eating/Feeding: Set up;Sitting   Grooming: Set up;Sitting;Wash/dry hands;Wash/dry face Grooming Details (indicate cue type and reason): unable to maintain standing without BUE assist - currently requires sitting Upper Body Bathing: Minimal assistance;Sitting  Lower Body Bathing: Minimal assistance;Sitting/lateral leans Lower Body Bathing Details (indicate cue type and reason): knees down Upper Body Dressing :  Minimal assistance Upper Body Dressing Details (indicate cue type and reason): donning gown as robe Lower Body Dressing: Moderate assistance;Sitting/lateral leans Lower Body Dressing Details (indicate cue type and reason): unable to perform with RLE, can don/doff sock on LLE Toilet Transfer: Minimal assistance;Ambulation;Rolling walker (2 wheels) Toilet Transfer Details (indicate cue type and reason): short distance <5Ft Toileting- Clothing Manipulation and Hygiene: Moderate assistance;Sitting/lateral lean Toileting - Clothing Manipulation Details (indicate cue type and reason): requires BUE to maintain standing position   Tub/Shower Transfer Details (indicate cue type and reason): "I will not go into shower right now, I would not want to fall" Functional mobility during ADLs: Minimal assistance;Rolling walker (2 wheels) General ADL Comments: decreased access to LB, transfers and limited mobility requiring assist.     Vision Ability to See in Adequate Light: 0 Adequate Patient Visual Report: No change from baseline Vision Assessment?: No apparent visual deficits     Perception     Praxis      Pertinent Vitals/Pain Pain Assessment Pain Assessment: 0-10 Pain Score: 5  Pain Location: back and with standing radiates down leg and butt Pain Descriptors / Indicators: Discomfort, Grimacing, Guarding, Radiating, Shooting Pain Intervention(s): Monitored during session, Repositioned, Other (comment) (back precautions for pain management)     Hand Dominance Right   Extremity/Trunk Assessment Upper Extremity Assessment Upper Extremity Assessment: Overall WFL for tasks assessed   Lower Extremity Assessment Lower Extremity Assessment: Defer to Dave Silva evaluation       Communication Communication Communication: No difficulties   Cognition Arousal/Alertness: Awake/alert Behavior During Therapy: WFL for tasks assessed/performed Overall Cognitive Status: Within Functional Limits for tasks  assessed                                 General Comments: Dave Silva tangential story teller, very cooperative, very safety aware     General Comments  Dave Silva dropped to 84% on RA - required 2 L O2 and 90 seconds to return >90% SpO2    Exercises     Shoulder Instructions      Home Living Family/patient expects to be discharged to:: Private residence Living Arrangements: Children (son and wife and 4 kids in [place]) Available Help at Discharge: Family;Available 24 hours/day Type of Home: House Home Access: Stairs to enter [employer] of Steps: 3 Entrance Stairs-Rails: Left Home Layout: Multi-level;Able to live on main level with bedroom/bathroom     Bathroom Shower/Tub: Walk-in shower   Bathroom Toilet: Handicapped height     Home Equipment: Agricultural consultant (2 wheels)   Additional Comments: Dave Silva typically lives in [place] with his son (and family). He is currently staying with his oldest son here in [place]. Home set up info is for his oldest son's house. 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      Prior Functioning/Environment Prior Level of Function : Independent/Modified Independent             Mobility Comments: prior to fall in [place] was mod I (no DME) ADLs Comments: independent prior to [place]        OT Problem List: Decreased range of motion;Decreased activity tolerance;Impaired balance (sitting and/or standing);Decreased knowledge of use of DME or AE;Pain      OT Treatment/Interventions: Self-care/ADL training;Energy conservation;DME and/or AE instruction;Therapeutic activities;Patient/family education;Balance training    OT  Goals(Current goals can be found in the care plan section) Acute Rehab OT Goals Patient Stated Goal: get back to walking independently OT Goal Formulation: With patient Time For Goal Achievement: 06/26/22 Potential to Achieve Goals: Good ADL Goals Dave Silva Will Perform Grooming: with supervision;standing Dave Silva Will Perform  Upper Body Dressing: with modified independence;sitting Dave Silva Will Perform Lower Body Dressing: with set-up;with adaptive equipment;sit to/from stand Dave Silva Will Transfer to Toilet: with supervision;ambulating Dave Silva Will Perform Toileting - Clothing Manipulation and hygiene: with supervision;sit to/from stand Additional ADL Goal #1: Dave Silva will perform bed mobility at mod I level via log roll prior to engaging in ADL  OT Frequency: Min 2X/week    Co-evaluation              AM-PAC OT "6 Clicks" Daily Activity     Outcome Measure Help from another person eating meals?: None Help from another person taking care of personal grooming?: A Little Help from another person toileting, which includes using toliet, bedpan, or urinal?: A Lot Help from another person bathing (including washing, rinsing, drying)?: A Lot Help from another person to put on and taking off regular upper body clothing?: A Little Help from another person to put on and taking off regular lower body clothing?: A Lot 6 Click Score: 16   End of Session Equipment Utilized During Treatment: Gait belt;Rolling walker (2 wheels);Oxygen (2L) Nurse Communication: Mobility status;Precautions  Activity Tolerance: Patient tolerated treatment well (impacted by pain) Patient left: in chair;with call bell/phone within reach;with chair alarm set  OT Visit Diagnosis: Unsteadiness on feet (R26.81);Other abnormalities of gait and mobility (R26.89);Muscle weakness (generalized) (M62.81);History of falling (Z91.81);Other symptoms and signs involving the nervous system (R29.898);Pain Pain - Right/Left: Right Pain - part of body: Hip;Leg (back)                Time: 1610-9604 OT Time Calculation (min): 42 min Charges:  OT General Charges $OT Visit: 1 Visit OT Evaluation $OT Eval Moderate Complexity: 1 Mod OT Treatments $Self Care/Home Management : 8-22 mins $Therapeutic Activity: 8-22 mins  Nyoka Cowden OTR/L Acute Rehabilitation Services Office:  [phone]  Evern Bio [facility] 06/12/2022, 12:37 PM

## 2022-06-12 NOTE — Plan of Care (Signed)

## 2022-06-13 DIAGNOSIS — I2510 Atherosclerotic heart disease of native coronary artery without angina pectoris: Secondary | ICD-10-CM | POA: Diagnosis not present

## 2022-06-13 LAB — CBC
HCT: 41 % (ref 39.0–52.0)
Hemoglobin: 13.9 g/dL (ref 13.0–17.0)
MCH: 35.1 pg — ABNORMAL HIGH (ref 26.0–34.0)
MCHC: 33.9 g/dL (ref 30.0–36.0)
MCV: 103.5 fL — ABNORMAL HIGH (ref 80.0–100.0)
Platelets: 137 10*3/uL — ABNORMAL LOW (ref 150–400)
RBC: 3.96 MIL/uL — ABNORMAL LOW (ref 4.22–5.81)
RDW: 13.2 % (ref 11.5–15.5)
WBC: 12.1 10*3/uL — ABNORMAL HIGH (ref 4.0–10.5)
nRBC: 0 % (ref 0.0–0.2)

## 2022-06-13 LAB — BASIC METABOLIC PANEL
Anion gap: 6 (ref 5–15)
BUN: 21 mg/dL (ref 8–23)
CO2: 25 mmol/L (ref 22–32)
Calcium: 8.1 mg/dL — ABNORMAL LOW (ref 8.9–10.3)
Chloride: 103 mmol/L (ref 98–111)
Creatinine, Ser: 1.58 mg/dL — ABNORMAL HIGH (ref 0.61–1.24)
GFR, Estimated: 43 mL/min — ABNORMAL LOW (ref >60)
Glucose, Bld: 134 mg/dL — ABNORMAL HIGH (ref 70–99)
Potassium: 4.5 mmol/L (ref 3.5–5.1)
Sodium: 134 mmol/L — ABNORMAL LOW (ref 135–145)

## 2022-06-13 MED ORDER — SENNOSIDES-DOCUSATE SODIUM 8.6-50 MG PO TABS
2.0000 | ORAL_TABLET | Freq: Two times a day (BID) | ORAL | Status: DC | PRN
  Administered 2022-06-13 – 2022-06-15 (×3): 2 via ORAL
  Filled 2022-06-13 (×3): qty 2

## 2022-06-13 NOTE — Progress Notes (Signed)
[facility] PROGRESS NOTE  Bart Ashford. (DOB: 12-Aug-1940) ZOX:[id number] PCP: Patient, No Pcp Per  Brief Narrative: Dave Silva. is an 82 y.o. male with a history of CAD s/p 4v CABG 2010, asthma/COPD, HTN, dyslipidemia, prostate CA treated w/radiation who presented to the ED on 06/10/2022 with worsening leg weakness, falls and low back pain. There was concern during a trip to [place] for syncope and fall onto right hip/back with subsequently negative radiographic evaluation for fracture. On follow up with orthopedics 4/28 he was prescribed oxycodone, gabapentin, and prednisone, but presented to the ED the next day with worse pain and its impairing his ability to ambulate. In the ED he was afebrile with intact sensation and strength in lower extremities, ECG with NSR, incomplete RBBB. SCr 1.54 from presumptive baseline of 0.93, BUN 31, sodium 127. IV fluids were given and admission was requested. CT head showed atrophy, small vessel disease, acute/chronic maxillary sinusitis without acute intracranial findings. Subsequent MRI of the lumbar spine and right hip revealed no fracture or dislocation. There is severe canal stenosis at L4 level for which orthopedics/spine surgery is consulted. Cardiology was consulted, suspecting vasovagal etiology to passing out episode previously and, after echo showed preserved LV systolic function and aortic stenosis, recommended outpatient cardiac monitoring after discharge.   Foley was placed in OR after traversing pinhole urethral stricture 4/30, TOV planned 5/2. Renal function remains abnormal though not worsening. PT evaluation is pending to inform urgency for lumbar decompression.  Subjective: Pain in lower back radiating down legs severe and debilitating when getting up with PT yesterday. Says he needs surgery. PT says if no surgery, would need SNF. Denies shortness of breath or chest pain or wheezing. Not on oxygen normally, currently on it but  states breathing is about at his baseline.  Objective: BP 126/74 (BP Location: Left Arm)   Pulse 65   Temp 97.9 F (36.6 C) (Oral)   Resp 17   Ht 5\' 11"  (1.803 m)   Wt 77.6 kg   SpO2 98%   BMI 23.85 kg/m   Gen: No distress, elderly male Pulm: Clear, nonlabored. Crackles at bases that clear with repeated inspiration, no wheezes.   CV: RRR, stable II/VI systolic murmur at base, no JVD, no pitting edema. GI: Soft, NT, ND, +BS Neuro: Alert and oriented. No new focal deficits. Ext: Warm, no deformities. Skin: No new rashes, lesions or ulcers on visualized skin   Assessment & Plan: Principal Problem:   Acute metabolic encephalopathy Active Problems:   AKI (acute kidney injury) (HCC)   Hyponatremia   Syncope, vasovagal   Acute right hip pain   Stage 1 mild COPD by GOLD classification (HCC)   CAD s/p CABGx4 in 2010   GERD (gastroesophageal reflux disease)   Major depression in remission [facility])   Essential hypertension   Bladder neck stricture   Urinary retention  Acute metabolic encephalopathy on memory impairment: Chronic without formal diagnosis. Retired Tax adviser, PhD Environmental manager. Multifactorial abrupt deterioration inclusive of multiple medications, but has improved significantly.  - Continue oxycodone prn pain.  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  - Appreciate spine surgery, Dr. Shon Baton evaluating the patient. No urgent surgery required, no cauda equina syndrome. May need decompression surgically if unable to ambulate. PT/OT evaluations on 5/1 showed significant debility, stated he'd need SNF if no surgery with his current LOF. Defer plan to Dr. Shon Baton at this time.  - Prednisone taper ordered for  pain control.  Acute urinary retention, urethral stricture: Imaging with trabeculations in bladder, retaining >450cc postvoid. Unable to place foley bedside, taken to OR 4/30 where dense pinhole stricture confirmed during  cystoscopy and balloon dilated, 43f catheter placed.  - TOV 5/2 per urology.    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  - Improved with relief of bladder obstruction, unclear where nadir will be, will continue monitoring.   - UA bland w/u RBCs, casts, protein.   Hyponatremia: Improving  HTN: Valsartan 80mg  per PCP note (in [place], Dr. Eldridge Scot) - Continue formulary ARB.   GERD:  - Continue PPI, EMR shows plan for outpatient EGD.   Asthma/COPD: Quiescent. PFT June 2022: FVC 3.64 (86%), FEV1 2.53 (84%), ratio 69, TLC 4.35 (59%), DLCOunc 7.68 (30%) corrects to 58% with lung volumes/ Minimal obstruction without BD response. Moderate-severe diffusion defect.  - Followed by Dr. Sherene Sires, on singulair and stiolto respimat (continue LAMA, LABA formulary) and prn albuterol.  - Pt on oxygen currently though CXR clear, lungs without wheezing on exam. Will encourage frequent use of incentive spirometry, d/w pt today who is familiar. If still unable to wean from oxygen, would need repeat CXR.   CAD s/p CABG, HLD: No anginal complaints, no ischemic ECG features. No WMA on echo, LVEF 55-60%, normal RV - Continue ASA 81mg , rosuvastatin 40mg   Syncope: Vasovagal suspected. - Appreciate cardiology evaluation, signed off stating no further evaluation required for surgery for which he is at mild-moderate risk. Will keep on cardiac monitoring and arrange zio patch at DC.   Depression: Quiescent.  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 Stable on recheck, no bleeding. Possibly due to vitamin deficiencies.  Macrocytosis: Low-normal vitamin levels.  - Started thiamine, folate, and MVM/mineral supplement orally. Started IM B12 while in house, then convert to po.  - No bleeding, continue monitoring  Tyrone Nine, MD [facility] [email or web address] 06/13/2022, 8:46 AM

## 2022-06-13 NOTE — TOC Initial Note (Signed)
Transition of Care (TOC) - Initial/Assessment Note    Patient Details  Name: Dave Silva. MRN: [id number] Date of Birth: 25-Apr-1940  Transition of [facility]) CM/SW Contact:    Otelia Santee, LCSW Phone Number: 06/13/2022, 12:44 PM  Clinical Narrative:                 Pt recommended for SNF placement if unable to have surgery. TOC will continue to follow for discharge needs and disposition plan.   Expected Discharge Plan: Skilled Nursing Facility Barriers to Discharge: Continued Medical Work up   Patient Goals and CMS Choice Patient states their goals for this hospitalization and ongoing recovery are:: To have surgery CMS Medicare.gov Compare Post Acute Care list provided to:: Patient Choice offered to / list presented to : Patient [place] ownership interest in [facility].provided to:: Patient    Expected Discharge Plan and Services In-house Referral: NA Discharge Planning Services: NA Post Acute Care Choice: Skilled Nursing Facility Living arrangements for the past 2 months: Single Family Home                 DME Arranged: N/A DME Agency: NA                  Prior Living Arrangements/Services Living arrangements for the past 2 months: Single Family Home Lives with:: Adult Children Patient language and need for interpreter reviewed:: Yes Do you feel safe going back to the place where you live?: Yes      Need for Family Participation in Patient Care: No (Comment) Care giver support system in place?: No (comment) Current home services: DME Criminal Activity/Legal Involvement Pertinent to Current Situation/Hospitalization: No - Comment as needed  Activities of Daily Living Home Assistive Devices/Equipment: Dan Humphreys (specify type) ADL Screening (condition at time of admission) Patient's cognitive ability adequate to safely complete daily activities?: Yes Is the patient deaf or have difficulty hearing?: No Does the patient have difficulty seeing,  even when wearing glasses/contacts?: No Does the patient have difficulty concentrating, remembering, or making decisions?: No Patient able to express need for assistance with ADLs?: Yes Does the patient have difficulty dressing or bathing?: Yes Independently performs ADLs?: No Communication: Independent Dressing (OT): Needs assistance Is this a change from baseline?: Pre-admission baseline Grooming: Independent Feeding: Independent Bathing: Needs assistance Is this a change from baseline?: Pre-admission baseline Toileting: Independent In/Out Bed: Needs assistance Is this a change from baseline?: Pre-admission baseline Walks in Home: Needs assistance, Independent with device (comment) Is this a change from baseline?: Pre-admission baseline Does the patient have difficulty walking or climbing stairs?: Yes Weakness of Legs: Both Weakness of Arms/Hands: Both  Permission Sought/Granted   Permission granted to share information with : No              Emotional Assessment   Attitude/Demeanor/Rapport: Unable to Assess Affect (typically observed): Unable to Assess Orientation: : Oriented to Self, Oriented to Place, Oriented to  Time, Oriented to Situation Alcohol / Substance Use: Not Applicable Psych Involvement: No (comment)  Admission diagnosis:  Dehydration [E86.0] Acute metabolic encephalopathy [G93.41] Pain of right hip [M25.551] Patient Active Problem List   Diagnosis Date Noted   Bladder neck stricture 06/12/2022   Urinary retention 06/12/2022   Acute metabolic encephalopathy 06/10/2022   AKI (acute kidney injury) (HCC) 06/10/2022   Hyponatremia 06/10/2022   Syncope, vasovagal 06/10/2022   Acute right hip pain 06/10/2022   Essential hypertension 04/10/2020   CAD s/p CABGx4 in 2010 01/12/2020  GERD (gastroesophageal reflux disease) 01/12/2020   Vitamin D deficiency 01/12/2020   Rhinitis 01/12/2020   Major depression in remission (HCC) 01/12/2020   Prostate cancer  (HCC) s/p radiation therapy 01/12/2020   Stage 1 mild COPD by GOLD classification (HCC)    PCP:  Patient, No Pcp Per Pharmacy:   [id number] - [place], [place] - [address] [place] [address] Phone: [phone] Fax: [phone]     Social Determinants of Health (SDOH) Social History: SDOH Screenings   Food Insecurity: No Food Insecurity (06/10/2022)  Housing: Low Risk  (06/10/2022)  Transportation Needs: No Transportation Needs (06/10/2022)  Utilities: Not At Risk (06/10/2022)  Depression (PHQ2-9): Low Risk  (01/12/2020)  Tobacco Use: Medium Risk (06/12/2022)   SDOH Interventions:     Readmission Risk Interventions    06/13/2022   12:42 PM 06/12/2022    3:45 PM  Readmission Risk Prevention Plan  Transportation Screening Complete Complete  PCP or Specialist Appt within 5-7 Days Complete   PCP or Specialist Appt within 3-5 Days  Complete  Home Care Screening Complete   Medication Review (RN CM) Complete   HRI or Home Care Consult  Complete  Social Work Consult for Recovery Care Planning/Counseling  Complete  Palliative Care Screening  Not Applicable  Medication Review Oceanographer)  Complete

## 2022-06-13 NOTE — Progress Notes (Signed)
Physical Therapy Treatment Patient Details Name: Dave Silva. MRN: [id number] DOB: 08-23-1940 Today's Date: 06/13/2022   History of Present Illness 82 y/o male admitted 06/10/22 with fall, weakness, and leg pain. Dx with acute metabolic encephalopathy. There is some concern for neurologic impingement (ortho following) and he was found to be in urinary retention. Now s/p Cystoscopy with urethral dilatation. Orthopedics following for possible surgery. PMH includes asthma/COPD, hypertension, dyslipidemia, coronary artery disease status post CABG x 4, Cancer.    PT Comments    General Comments: AxO x 3 very pleasant and motivated.  Assisted OOB to amb in hallway.  General transfer comment: Assist to power up, stabilize, control descent. Cues for safety, technique. Increased time. General Gait Details: tolerated an increased distance with decreased c/o pain.  Also monitoring sats with avg RA with amb at 92%.  At rest 96%  Hx COPD    Recommendations for follow up therapy are one component of a multi-disciplinary discharge planning process, led by the attending physician.  Recommendations may be updated based on patient status, additional functional criteria and insurance authorization.  Follow Up Recommendations  Can patient physically be transported by private vehicle: No    Assistance Recommended at Discharge Frequent or constant Supervision/Assistance  Patient can return home with the following Assist for transportation;Help with stairs or ramp for entrance;Assistance with cooking/housework;Two people to help with walking and/or transfers;A lot of help with bathing/dressing/bathroom   Equipment Recommendations  Rolling walker (2 wheels)    Recommendations for Other Services       Precautions / Restrictions Precautions Precautions: None Precaution Comments: back precautions for comfort/L4 L5 Restrictions Weight Bearing Restrictions: No     Mobility  Bed Mobility Overal bed  mobility: Needs Assistance Bed Mobility: Rolling, Sidelying to Sit Rolling: Min guard, Min assist         General bed mobility comments: increased time and use of rail    Transfers Overall transfer level: Needs assistance Equipment used: Rolling walker (2 wheels) Transfers: Sit to/from Stand Sit to Stand: Min assist           General transfer comment: Assist to power up, stabilize, control descent. Cues for safety, technique. Increased time.    Ambulation/Gait Ambulation/Gait assistance: Min assist Gait Distance (Feet): 45 Feet Assistive device: Rolling walker (2 wheels) Gait Pattern/deviations: Antalgic, Decreased step length - right, Decreased step length - left Gait velocity: decreased     General Gait Details: tolerated an increased distance with decreased c/o pain.  Also monitoring sats with avg RA with amb at 92%.  At rest 96%  Hx COPD   Stairs             Wheelchair Mobility    Modified Rankin (Stroke Patients Only)       Balance                                            Cognition Arousal/Alertness: Awake/alert Behavior During Therapy: WFL for tasks assessed/performed Overall Cognitive Status: Within Functional Limits for tasks assessed                                 General Comments: AxO x 3 very pleasant and motivated        Exercises      General Comments  Pertinent Vitals/Pain Pain Assessment Pain Assessment: 0-10 Pain Score: 3  Pain Location: back and with standing radiates down leg and butt.  Improved from yesterday. Pain Descriptors / Indicators: Discomfort Pain Intervention(s): Monitored during session, Premedicated before session, Repositioned    Home Living                          Prior Function            PT Goals (current goals can now be found in the care plan section) Progress towards PT goals: Progressing toward goals    Frequency    Min 1X/week       PT Plan Current plan remains appropriate    Co-evaluation              AM-PAC PT "6 Clicks" Mobility   Outcome Measure  Help needed turning from your back to your side while in a flat bed without using bedrails?: A Lot Help needed moving from lying on your back to sitting on the side of a flat bed without using bedrails?: A Lot Help needed moving to and from a bed to a chair (including a wheelchair)?: A Lot Help needed standing up from a chair using your arms (e.g., wheelchair or bedside chair)?: A Lot Help needed to walk in hospital room?: A Lot Help needed climbing 3-5 steps with a railing? : Total 6 Click Score: 11    End of Session Equipment Utilized During Treatment: Gait belt Activity Tolerance: Patient tolerated treatment well Patient left: in chair;with call bell/phone within reach;with family/visitor present;with chair alarm set Nurse Communication: Mobility status PT Visit Diagnosis: Pain;Difficulty in walking, not elsewhere classified (R26.2);History of falling (Z91.81);Repeated falls (R29.6)     Time: 1032-1100 PT Time Calculation (min) (ACUTE ONLY): 28 min  Charges:  $Gait Training: 8-22 mins $Therapeutic Activity: 8-22 mins                     Felecia Shelling  PTA Acute  Rehabilitation Services Office M-F          [phone]

## 2022-06-13 NOTE — Plan of Care (Signed)
Ambulated in hall with walker with PT this am. Walked to BR using walker this afternoon. States pain is greatly improved.

## 2022-06-13 NOTE — Progress Notes (Addendum)
2 Days Post-Op Subjective: NAEON. Pt was A&O.  Patient is doing well this morning from urologic perspective.  He reports that he is unable to walk and he will need to undergo orthopedic spinal procedure.  Pain is well-managed when at rest.  Objective: Vital signs in last 24 hours: Temp:  [97.7 F (36.5 C)-97.9 F (36.6 C)] 97.9 F (36.6 C) (05/02 0545) Pulse Rate:  [65-74] 65 (05/02 0545) Resp:  [17-22] 17 (05/02 0545) BP: (120-149)/(74-85) 126/74 (05/02 0545) SpO2:  [95 %-100 %] 98 % (05/02 0545)  Intake/Output from previous day: 05/01 0701 - 05/02 0700 In: 597 [P.O.:597] Out: 1525 [Urine:1525]  Intake/Output this shift: No intake/output data recorded.  Physical Exam:  General: Alert and oriented CV: No cyanosis Lungs: equal chest rise Abdomen: Soft, NTND, no rebound or guarding Gu: Foley in place draining clear yellow urine  Lab Results: Recent Labs    06/10/22 1312 06/11/22 0551 06/13/22 0615  HGB 15.0 14.6 13.9  HCT 43.4 43.8 41.0   BMET Recent Labs    06/12/22 0539 06/13/22 0615  NA 136 134*  K 4.5 4.5  CL 106 103  CO2 26 25  GLUCOSE 99 134*  BUN 24* 21  CREATININE 1.65* 1.58*  CALCIUM 7.8* 8.1*     Studies/Results: ECHOCARDIOGRAM COMPLETE  Result Date: 06/11/2022    ECHOCARDIOGRAM REPORT   Patient Name:   Dave Silva. Date of Exam: 06/11/2022 Medical Rec #:  [id number]          Height:       71.0 in Accession #:    [id number]         Weight:       171.0 lb Date of Birth:  [birth year]          BSA:          1.973 m Patient Age:    82 years           BP:           149/75 mmHg Patient Gender: M                  HR:           59 bpm. Exam Location:  Inpatient Procedure: 2D Echo, Cardiac Doppler and Color Doppler Indications:    syncope  History:        Patient has no prior history of Echocardiogram examinations.                 COPD; Risk Factors:Hypertension and Dyslipidemia.  Sonographer:    Mike Gip Referring Phys: [id number] TOCHUKWU AGBATA  IMPRESSIONS  1. Left ventricular ejection fraction, by estimation, is 55 to 60%. The left ventricle has normal function. The left ventricle has no regional wall motion abnormalities. Left ventricular diastolic function could not be evaluated.  2. Right ventricular systolic function is normal. The right ventricular size is normal. There is mildly elevated pulmonary artery systolic pressure. The estimated right ventricular systolic pressure is 38.8 mmHg.  3. The mitral valve is degenerative. No evidence of mitral valve regurgitation. No evidence of mitral stenosis. Moderate mitral annular calcification.  4. Tricuspid valve regurgitation is moderate.  5. 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 The aortic valve is calcified. Aortic valve regurgitation is mild. Aortic valve area, by VTI measures 0.79 cm. Aortic valve  mean gradient measures 16.0 mmHg. FINDINGS  Left Ventricle: Left ventricular ejection fraction, by  estimation, is 55 to 60%. The left ventricle has normal function. The left ventricle has no regional wall motion abnormalities. The left ventricular internal cavity size was normal in size. There is  no left ventricular hypertrophy. Left ventricular diastolic function could not be evaluated due to mitral annular calcification (moderate or greater). Left ventricular diastolic function could not be evaluated. Right Ventricle: The right ventricular size is normal. No increase in right ventricular wall thickness. Right ventricular systolic function is normal. There is mildly elevated pulmonary artery systolic pressure. The tricuspid regurgitant velocity is 2.99  m/s, and with an assumed right atrial pressure of 3 mmHg, the estimated right ventricular systolic pressure is 38.8 mmHg. Left Atrium: Left atrial size was normal in size. Right Atrium: Right atrial size was normal in size. Pericardium: There is no evidence of  pericardial effusion. Mitral Valve: The mitral valve is degenerative in appearance. Moderate mitral annular calcification. No evidence of mitral valve regurgitation. No evidence of mitral valve stenosis. Tricuspid Valve: The tricuspid valve is normal in structure. Tricuspid valve regurgitation is moderate . No evidence of tricuspid stenosis. Aortic Valve: 2D AVA 1.88 cm2. 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 The aortic valve is calcified. Aortic valve regurgitation is mild. Aortic regurgitation PHT measures 400 msec. Aortic valve mean gradient measures 16.0 mmHg. Aortic valve peak gradient measures 24.3 mmHg. Aortic valve area, by VTI measures 0.79 cm. Pulmonic Valve: The pulmonic valve was not well visualized. Pulmonic valve regurgitation is mild. No evidence of pulmonic stenosis. Aorta: The aortic root and ascending aorta are structurally normal, with no evidence of dilitation. IAS/Shunts: No atrial level shunt detected by color flow Doppler.  LEFT VENTRICLE PLAX 2D LVIDd:         4.10 cm     Diastology LVIDs:         2.90 cm     LV e' medial:    5.55 cm/s LV PW:         1.00 cm     LV E/e' medial:  14.1 LV IVS:        1.00 cm     LV e' lateral:   8.27 cm/s LVOT diam:     1.80 cm     LV E/e' lateral: 9.5 LV SV:         49 LV SV Index:   25 LVOT Area:     2.54 cm  LV Volumes (MOD) LV vol d, MOD A2C: 90.0 ml LV vol d, MOD A4C: 82.1 ml LV vol s, MOD A2C: 39.1 ml LV vol s, MOD A4C: 31.3 ml LV SV MOD A2C:     50.9 ml LV SV MOD A4C:     82.1 ml LV SV MOD BP:      51.2 ml RIGHT VENTRICLE             IVC RV Basal diam:  3.40 cm     IVC diam: 1.10 cm RV S prime:     11.90 cm/s TAPSE (M-mode): 1.8 cm LEFT ATRIUM             Index        RIGHT ATRIUM           Index LA diam:        2.80 cm 1.42 cm/m   RA Area:     13.80 cm LA Vol (A2C):   40.1 ml 20.33 ml/m  RA Volume:   32.20 ml  16.32 ml/m LA Vol (A4C):   57.6 ml 29.20 ml/m LA Biplane Vol: 51.9  ml 26.31 ml/m  AORTIC VALVE                     PULMONIC VALVE AV Area (Vmax):    0.83 cm      PR End Diast Vel: 3.76 msec AV Area (Vmean):   0.76 cm AV Area (VTI):     0.79 cm AV Vmax:           246.33 cm/s AV Vmean:          177.667 cm/s AV VTI:            0.616 m AV Peak Grad:      24.3 mmHg AV Mean Grad:      16.0 mmHg LVOT Vmax:         80.20 cm/s LVOT Vmean:        53.100 cm/s LVOT VTI:          0.192 m LVOT/AV VTI ratio: 0.31 AI PHT:            400 msec  AORTA Ao Root diam: 3.50 cm Ao Asc diam:  3.30 cm MITRAL VALVE               TRICUSPID VALVE MV Area (PHT): 3.39 cm    TR Peak grad:   35.8 mmHg MV Decel Time: 224 msec    TR Vmax:        299.00 cm/s MV E velocity: 78.40 cm/s MV A velocity: 58.30 cm/s  SHUNTS MV E/A ratio:  1.34        Systemic VTI:  0.19 m                            Systemic Diam: 1.80 cm Riley Lam MD Electronically signed by Riley Lam MD Signature Date/Time: 06/11/2022/1:28:01 PM    Final     Assessment/Plan: Failed bedside foley attempt led to OR for suspected pinhole stricture. 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 61f catheter in place.  Renal function is stable.  Continue to follow daily labs.  Had plan for trial of void today but it looks like patient will be undergoing lumbar decompression and fusion.  He will need the Foley while he is in surgery and for the first days following his recovery.  Leave catheter in until patient is ambulatory and having regular bowel movements postoperatively.  Urology will follow along peripherally in the meantime.  Please feel free to call with any questions.   LOS: 3 days   Elmon Kirschner, NP [facility] Pager: [phone]  06/13/2022, 9:07 AM

## 2022-06-13 NOTE — Care Management Important Message (Signed)
Important Message  Patient Details IM Letter given. Name: Dave Silva. MRN: [id number] Date of Birth: 1940-06-02   Medicare Important Message Given:  Yes     Caren Macadam 06/13/2022, 11:59 AM

## 2022-06-13 NOTE — Plan of Care (Signed)

## 2022-06-14 DIAGNOSIS — G9341 Metabolic encephalopathy: Secondary | ICD-10-CM | POA: Diagnosis not present

## 2022-06-14 LAB — BASIC METABOLIC PANEL
Anion gap: 7 (ref 5–15)
BUN: 21 mg/dL (ref 8–23)
CO2: 27 mmol/L (ref 22–32)
Calcium: 8.3 mg/dL — ABNORMAL LOW (ref 8.9–10.3)
Chloride: 102 mmol/L (ref 98–111)
Creatinine, Ser: 1.52 mg/dL — ABNORMAL HIGH (ref 0.61–1.24)
GFR, Estimated: 45 mL/min — ABNORMAL LOW (ref >60)
Glucose, Bld: 119 mg/dL — ABNORMAL HIGH (ref 70–99)
Potassium: 4.7 mmol/L (ref 3.5–5.1)
Sodium: 136 mmol/L (ref 135–145)

## 2022-06-14 LAB — CBC
HCT: 43.2 % (ref 39.0–52.0)
Hemoglobin: 14.3 g/dL (ref 13.0–17.0)
MCH: 34.4 pg — ABNORMAL HIGH (ref 26.0–34.0)
MCHC: 33.1 g/dL (ref 30.0–36.0)
MCV: 103.8 fL — ABNORMAL HIGH (ref 80.0–100.0)
Platelets: 148 10*3/uL — ABNORMAL LOW (ref 150–400)
RBC: 4.16 MIL/uL — ABNORMAL LOW (ref 4.22–5.81)
RDW: 13.2 % (ref 11.5–15.5)
WBC: 10.7 10*3/uL — ABNORMAL HIGH (ref 4.0–10.5)
nRBC: 0 % (ref 0.0–0.2)

## 2022-06-14 NOTE — Progress Notes (Signed)
Occupational Therapy Treatment Patient Details Name: Dave Piere. MRN: [id number] DOB: 1940-09-23 Today's Date: 06/14/2022   History of present illness 82 y/o male admitted 06/10/22 with fall, weakness, and leg pain. Dx with acute metabolic encephalopathy. There is some concern for neurologic impingement (ortho following) and he was found to be in urinary retention. Now s/p Cystoscopy with urethral dilatation. Orthopedics following for possible surgery. PMH includes asthma/COPD, hypertension, dyslipidemia, coronary artery disease status post CABG x 4, Cancer.   OT comments  Pt was motivated towards therapy. He presented with 4/10 low back and radiating RLE pain while in standing. He reported his overall pain has improved, as compared to prior days. He required min guard to min assist for bed mobility, sit to stand, ambulating using a RW, and for grooming in standing at sink level. Pt was noted to present with intermittent unsteadiness in standing, and compromised standing tolerance in prolonged standing. He resides in [place], however his here visiting family. He expressed significant concerns about being able to manage required stairs at his residence here in [place], and he does not feel as though he could tolerate the plane ride back home to [place]. As such, he desires post-acute inpatient rehab services, in order to maximize his functional independence; OT is in agreement with this. Continue OT plan of care.    Recommendations for follow up therapy are one component of a multi-disciplinary discharge planning process, led by the attending physician.  Recommendations may be updated based on patient status, additional functional criteria and insurance authorization.    Assistance Recommended at Discharge Frequent or constant Supervision/Assistance  Patient can return home with the following  Assist for transportation;Assistance with cooking/housework;A little help with  bathing/dressing/bathroom;Help with stairs or ramp for entrance   Equipment Recommendations  Other (comment)       Precautions / Restrictions Precautions Precaution Comments: back precautions for comfort/L4 L5 Restrictions Weight Bearing Restrictions: No       Mobility Bed Mobility Overal bed mobility: Needs Assistance Bed Mobility: Rolling, Supine to Sit Rolling: Min guard         General bed mobility comments: required use of bed rail, slightly increased effort, and verbal cues for log roll technique    Transfers Overall transfer level: Needs assistance Equipment used: Rolling walker (2 wheels) Transfers: Sit to/from Stand Sit to Stand: Min guard, From elevated surface     Step pivot transfers: Min guard     General transfer comment: Pt transferred to the bedside chair, requiring steadying assist         ADL either performed or assessed with clinical judgement   ADL Overall ADL's : Needs assistance/impaired Eating/Feeding: Independent Eating/Feeding Details (indicate cue type and reason): seated at chair level Grooming: Min guard;Standing Grooming Details (indicate cue type and reason): Pt required steadying assist for performing hand and face washing at sink level.         Upper Body Dressing : Set up;Sitting   Lower Body Dressing: Moderate assistance                                 Cognition Arousal/Alertness: Awake/alert Behavior During Therapy: WFL for tasks assessed/performed Overall Cognitive Status: Within Functional Limits for tasks assessed            General Comments: Pleasant, motivated, able to follow commands without difficulty  Pertinent Vitals/ Pain       Pain Assessment Pain Assessment: 0-10 Pain Score: 4  Pain Location: low back radiating down R leg in standing Pain Intervention(s): Limited activity within patient's tolerance, Repositioned         Frequency  Min 2X/week         Progress Toward Goals  OT Goals(current goals can now be found in the care plan section)     Acute Rehab OT Goals Patient Stated Goal: to achieve maximal functional independence and return to normal activities OT Goal Formulation: With patient Time For Goal Achievement: 06/26/22 Potential to Achieve Goals: Good  Plan Discharge plan remains appropriate       AM-PAC OT "6 Clicks" Daily Activity     Outcome Measure   Help from another person eating meals?: None Help from another person taking care of personal grooming?: A Little Help from another person toileting, which includes using toliet, bedpan, or urinal?: A Little Help from another person bathing (including washing, rinsing, drying)?: A Lot Help from another person to put on and taking off regular upper body clothing?: None Help from another person to put on and taking off regular lower body clothing?: A Lot 6 Click Score: 18    End of Session Equipment Utilized During Treatment: Gait belt;Rolling walker (2 wheels)  OT Visit Diagnosis: Unsteadiness on feet (R26.81);History of falling (Z91.81);Pain   Activity Tolerance Patient tolerated treatment well   Patient Left in chair;with call bell/phone within reach;with family/visitor present   Nurse Communication Mobility status        Time: 0921-0950 OT Time Calculation (min): 29 min  Charges: OT General Charges $OT Visit: 1 Visit OT Treatments $Therapeutic Activity: 23-37 mins   Reuben Likes, OTR/L 06/14/2022, 10:08 AM

## 2022-06-14 NOTE — Progress Notes (Signed)
    Subjective: Procedure(s) (LRB): CYSTOSCOPY WITH URETHRAL DILATATION AND FOLEY CATHETER PLACEMENT (N/A) 3 Days Post-Op  Patient reports pain as 3 on 0-10 scale.  Reports decreased leg pain Positive void Negative bowel movement Positive flatus Negaitive chest pain or shortness of breath  Objective: Vital signs in last 24 hours: Temp:  [97.8 F (36.6 C)-98.2 F (36.8 C)] 98.2 F (36.8 C) (05/03 0353) Pulse Rate:  [61-73] 61 (05/03 0353) Resp:  [16-18] 16 (05/03 0353) BP: (121-133)/(65-90) 125/65 (05/03 0353) SpO2:  [90 %-93 %] 92 % (05/03 0353)  Intake/Output from previous day: 05/02 0701 - 05/03 0700 In: 120 [P.O.:120] Out: 1850 [Urine:1850]  Labs: Recent Labs    06/13/22 0615 06/14/22 0610  WBC 12.1* 10.7*  RBC 3.96* 4.16*  HCT 41.0 43.2  PLT 137* 148*   Recent Labs    06/13/22 0615 06/14/22 0610  NA 134* 136  K 4.5 4.7  CL 103 102  CO2 25 27  BUN 21 21  CREATININE 1.58* 1.52*  GLUCOSE 134* 119*  CALCIUM 8.1* 8.3*   No results for input(s): "LABPT", "INR" in the last 72 hours.  Physical Exam: A+O X3 No SOB/CP Abd soft/NT Neuro:  ambulating with assistance.  5/5 motor in the LE.  Negative babinski, intact sensation to light touch in the LE. LBP: improved since admission.  Patient rates pain as mild-moderate.   Assessment/Plan: Dave Silva is a very pleasant 82 year old gentleman who has significant spinal stenosis with neurogenic claudication.  Upon presentation earlier this week he had horrific pain, loss in function, and urinary retention.  At this point time he states his pain is significantly better.  He was ambulating in the hallway with the assistance of the therapist.  He remains neurologically intact with significant reduction in his back buttock and neuropathic leg pain.  Initially it was felt as though the patient would require surgery and I actually scheduled him for a lumbar decompression instrumented fusion for Monday.  However since he is  neurologically intact, ambulating, and his pain is well-controlled I do not see the need for surgery at this time.  While he does have severe spinal stenosis he is currently improving.  In the absence of any progressive neurological deficits or debilitating pain I would favor nonoperative pain management and physiotherapy.  At this point I will sign off.  The patient can follow-up with my partner Dr. Drucie Ip to discuss the potential role of injection therapy such as trigger point injections or nerve root blocks.  He will also continue physical therapy.  If he has a recurrence of his severe pain or progressive neurological deficits and I will be happy to see him back at which time we can move forward with a lumbar decompression and instrumented fusion.  Venita Lick, MD [facility] [phone]

## 2022-06-14 NOTE — Progress Notes (Signed)
Patient not having spinal surgery - intermittently ambulatory.  Wrote for foley to come out at 5am.

## 2022-06-14 NOTE — NC FL2 (Signed)
[place] MEDICAID FL2 LEVEL OF CARE FORM     IDENTIFICATION  Patient Name: Dave Silva. Birthdate: 1940/03/07 Sex: male Admission Date (Current Location): 06/10/2022  [facility] and [place] Number:  Producer, television/film/video and Address:  [facility],  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   Urinary retention 06/12/2022   Acute metabolic encephalopathy 06/10/2022   AKI (acute kidney injury) (HCC) 06/10/2022   Hyponatremia 06/10/2022   Syncope, vasovagal 06/10/2022   Acute right hip pain 06/10/2022   Essential hypertension 04/10/2020   CAD s/p CABGx4 in 2010 01/12/2020   GERD (gastroesophageal reflux disease) 01/12/2020   Vitamin D deficiency 01/12/2020   Rhinitis 01/12/2020   Major depression in remission (HCC) 01/12/2020   Prostate cancer (HCC) s/p radiation therapy 01/12/2020   Stage 1 mild COPD by GOLD classification (HCC)     Orientation RESPIRATION BLADDER Height & Weight     Self, Time, Situation, Place  Normal Continent Weight: 171 lb (77.6 kg) Height:  5\' 11"  (180.3 cm)  BEHAVIORAL SYMPTOMS/MOOD NEUROLOGICAL BOWEL NUTRITION STATUS      Continent Diet (Regular)  AMBULATORY STATUS COMMUNICATION OF NEEDS Skin   Limited Assist Verbally Normal                       Personal Care Assistance Level of Assistance  Bathing, Feeding, Dressing Bathing Assistance: Limited assistance Feeding assistance: Independent Dressing Assistance: Limited assistance     Functional Limitations Info   Sight, Hearing, Speech Sight Info: Impaired Hearing Info: Adequate Speech Info: Adequate    SPECIAL CARE FACTORS FREQUENCY  PT (By licensed PT), OT (By licensed OT)     PT Frequency: 5x/wk OT Frequency: 5x/wk            Contractures Contractures Info: Not present    Additional Factors Info  Code Status, Allergies Code Status Info: FULL Allergies Info: Certain "stronger" pain medications make the patient's stomach hurt, also (must have an anti emetic to tolerate)           Current Medications (06/14/2022):  This is the current hospital active medication list Current Facility-Administered Medications  Medication Dose Route Frequency Provider Last Rate Last Admin   albuterol (PROVENTIL) (2.5 MG/3ML) 0.083% nebulizer solution 2.5 mg  2.5 mg Nebulization Q6H PRN Agbata, Tochukwu, MD       arformoterol (BROVANA) nebulizer solution 15 mcg  15 mcg Nebulization BID Agbata, Tochukwu, MD   15 mcg at 06/14/22 0620   And   umeclidinium bromide (INCRUSE ELLIPTA) 62.5 MCG/ACT 1 puff  1 puff Inhalation Daily Agbata, Tochukwu, MD   1 puff at 06/14/22 3474   aspirin EC tablet 81 mg  81 mg Oral Daily Tyrone Nine, MD   81 mg at 06/14/22 1017   Chlorhexidine Gluconate Cloth 2 % PADS 6 each  6 each Topical Daily Tyrone Nine, MD   6 each at 06/14/22 1042   cholecalciferol (VITAMIN D3) tablet 4,000 Units  4,000 Units Oral Daily Lucile Shutters, MD  4,000 Units at 06/14/22 1017   [START ON 06/15/2022] cyanocobalamin (VITAMIN B12) tablet 1,000 mcg  1,000 mcg Oral Daily Tyrone Nine, MD       fluticasone (FLONASE) 50 MCG/ACT nasal spray 1-2 spray  1-2 spray Each Nare Daily PRN Agbata, Tochukwu, MD       folic acid (FOLVITE) tablet 1 mg  1 mg Oral Daily Hazeline Junker B, MD   1 mg at 06/14/22 1017   irbesartan (AVAPRO) tablet 75 mg  75 mg Oral Daily Agbata, Tochukwu, MD   75 mg at 06/14/22 1017   magnesium oxide (MAG-OX) tablet 400 mg  400 mg Oral Daily Agbata, Tochukwu, MD   400 mg at 06/14/22 1017    montelukast (SINGULAIR) tablet 10 mg  10 mg Oral QHS Agbata, Tochukwu, MD   10 mg at 06/13/22 2110   multivitamin with minerals tablet 1 tablet  1 tablet Oral Daily Hazeline Junker B, MD   1 tablet at 06/14/22 1017   ondansetron (ZOFRAN) tablet 4 mg  4 mg Oral Q6H PRN Agbata, Tochukwu, MD       Or   ondansetron (ZOFRAN) injection 4 mg  4 mg Intravenous Q6H PRN Agbata, Tochukwu, MD   4 mg at 06/12/22 2049   oxyCODONE (Oxy IR/ROXICODONE) immediate release tablet 5 mg  5 mg Oral Q6H PRN Agbata, Tochukwu, MD   5 mg at 06/12/22 2049   pantoprazole (PROTONIX) EC tablet 40 mg  40 mg Oral Daily Agbata, Tochukwu, MD   40 mg at 06/14/22 1016   rosuvastatin (CRESTOR) tablet 40 mg  40 mg Oral Daily Agbata, Tochukwu, MD   40 mg at 06/14/22 1017   senna-docusate (Senokot-S) tablet 2 tablet  2 tablet Oral BID PRN Tyrone Nine, MD   2 tablet at 06/14/22 1134   thiamine (VITAMIN B1) tablet 100 mg  100 mg Oral Daily Tyrone Nine, MD   100 mg at 06/14/22 1016     Discharge Medications: Please see discharge summary for a list of discharge medications.  Relevant Imaging Results:  Relevant Lab Results:   Additional Information SSN: [id number]  Otelia Santee, LCSW

## 2022-06-14 NOTE — Progress Notes (Signed)
[facility] PROGRESS NOTE  Dave Silva. (DOB: Jun 25, 1940) WUJ:[id number] PCP: Patient, No Pcp Per  Brief Narrative: Dave Gazda. is an 82 y.o. male with a history of CAD s/p 4v CABG 2010, asthma/COPD, HTN, dyslipidemia, prostate CA treated w/radiation who presented to the ED on 06/10/2022 with worsening leg weakness, falls and low back pain. There was concern during a trip to [place] for syncope and fall onto right hip/back with subsequently negative radiographic evaluation for fracture. On follow up with orthopedics 4/28 he was prescribed oxycodone, gabapentin, and prednisone, but presented to the ED the next day with worse pain and its impairing his ability to ambulate. In the ED he was afebrile with intact sensation and strength in lower extremities, ECG with NSR, incomplete RBBB. SCr 1.54 from presumptive baseline of 0.93, BUN 31, sodium 127. IV fluids were given and admission was requested. CT head showed atrophy, small vessel disease, acute/chronic maxillary sinusitis without acute intracranial findings. Subsequent MRI of the lumbar spine and right hip revealed no fracture or dislocation. There is severe canal stenosis at L4 level for which orthopedics/spine surgery is consulted. Cardiology was consulted, suspecting vasovagal etiology to passing out episode previously and, after echo showed preserved LV systolic function and aortic stenosis, recommended outpatient cardiac monitoring after discharge.   Foley was placed in OR after traversing pinhole urethral stricture 4/30, TOV planned 5/2. Renal function remains abnormal though not worsening. PT evaluation is pending to inform urgency for lumbar decompression.  Subjective: No acute issues or events overnight, denies nausea vomiting diarrhea constipation headache fevers chills or chest pain.  Back pain improving, as is ambulatory status, while admitted is certainly improvement from the past 48 hours..  Objective: BP 125/65 (BP  Location: Left Arm)   Pulse 61   Temp 98.2 F (36.8 C)   Resp 16   Ht 5\' 11"  (1.803 m)   Wt 77.6 kg   SpO2 92%   BMI 23.85 kg/m   Gen: No distress, elderly male Pulm: Clear, nonlabored. Crackles at bases that clear with repeated inspiration, no wheezes.   CV: RRR, stable II/VI systolic murmur at base, no JVD, no pitting edema. GI: Soft, NT, ND, +BS Neuro: Alert and oriented.  Ext: Warm, no deformities. 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 Retired Tax adviser, PhD Environmental manager. Multifactorial abrupt deterioration inclusive of multiple medications, but has improved significantly.  - Continue oxycodone prn pain.  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  - Appreciate spine surgery, Dr. Shon Baton evaluating the patient. No urgent surgery required, no cauda equina syndrome.  -Ambulatory status improving over the past 24 hours, patient hoping to avoid surgery, as such orthopedics will sign off.  Open patient continues to improve for planned disposition in the next 24 to 48 hours -Will likely need outpatient orthopedic follow-up, discussion on direct injection therapy in the near future. -Pending ambulatory status  patient is requesting discharge to return home to [place] with follow-up by his own primary care team -he does not want to transition to a nursing facility/rehab unless absolutely  necessary  Acute urinary retention, urethral stricture: Imaging with trabeculations in bladder, retaining >450cc postvoid. Unable to place foley bedside, taken to OR 4/30 where dense pinhole stricture confirmed during cystoscopy and balloon dilated, 67f catheter placed.  - TOV 5/2 per urology.    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  - Improved with relief of bladder obstruction, unclear where nadir will be, will continue monitoring.   - UA bland w/u RBCs, casts, protein.   Hyponatremia: Improving  HTN: Valsartan 80mg  per PCP note (in [place], Dr. Eldridge Scot) - Continue formulary ARB.   GERD:  - Continue PPI, EMR shows plan for outpatient EGD.   Asthma/COPD: Quiescent. PFT June 2022: FVC 3.64 (86%), FEV1 2.53 (84%), ratio 69, TLC 4.35 (59%), DLCOunc 7.68 (30%) corrects to 58% with lung volumes/ Minimal obstruction without BD response. Moderate-severe diffusion defect.  - Followed by Dr. Sherene Sires, on singulair and stiolto respimat (continue LAMA, LABA formulary) and prn albuterol.  - Pt on oxygen currently though CXR clear, lungs without wheezing on exam. Will encourage frequent use of incentive spirometry, d/w pt today who is familiar. If still unable to wean from oxygen, would need repeat CXR.   CAD s/p CABG, HLD: No anginal complaints, no ischemic ECG features. No WMA on echo, LVEF 55-60%, normal RV - Continue ASA 81mg , rosuvastatin 40mg   Syncope: Vasovagal suspected. -Cardiology signed off stating no further evaluation required for surgery for which he is at mild-moderate risk - arrange zio patch at DC.   Depression: Quiescent.  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 Stable on recheck, no bleeding. Possibly due to vitamin deficiencies.  Macrocytosis: Low-normal vitamin levels.  - Started thiamine, folate, and MVM/mineral supplement orally. Started IM B12 while in house, then convert to po.  - No bleeding, continue monitoring  Azucena Fallen, DO [facility] [email or web address] 06/14/2022, 8:14 AM

## 2022-06-14 NOTE — TOC Progression Note (Signed)
Transition of Care (TOC) - Progression Note    Patient Details  Name: Dave Silva. MRN: [id number] Date of Birth: [birth year]  Transition of Care [facility]) CM/SW Contact  Otelia Santee, LCSW Phone Number: 06/14/2022, 1:22 PM  Clinical Narrative:    Met with pt and DIL in room to discuss discharge plans. Pt and family would like pt to return to [place] however, state that pt will need to progress with PT further in order to be able to travel back to [place]. Pt and family agreeable to SNF placement for pt in [place]. Pt has not been to SNF in the past and family are unaware of facilities in this area. They are concerned of pt's discharge from SNF as family will be OOT from 5/12-5/18. SNF to be informed of this to work with family for discharge plans from their facility.  Per DIL and pt, his son, Thayer Ohm, will be patients main POC.  Referrals have been sent out for SNF placement and currently awaiting bed offers.    Expected Discharge Plan: Skilled Nursing Facility Barriers to Discharge: Continued Medical Work up  Expected Discharge Plan and Services In-house Referral: NA Discharge Planning Services: NA Post Acute Care Choice: Skilled Nursing Facility Living arrangements for the past 2 months: Single Family Home                 DME Arranged: N/A DME Agency: NA                   Social Determinants of Health (SDOH) Interventions SDOH Screenings   Food Insecurity: No Food Insecurity (06/10/2022)  Housing: Low Risk  (06/10/2022)  Transportation Needs: No Transportation Needs (06/10/2022)  Utilities: Not At Risk (06/10/2022)  Depression (PHQ2-9): Low Risk  (01/12/2020)  Tobacco Use: Medium Risk (06/12/2022)    Readmission Risk Interventions    06/13/2022   12:42 PM 06/12/2022    3:45 PM  Readmission Risk Prevention Plan  Transportation Screening Complete Complete  PCP or Specialist Appt within 5-7 Days Complete   PCP or Specialist Appt within 3-5 Days  Complete  Home Care Screening  Complete   Medication Review (RN CM) Complete   HRI or Home Care Consult  Complete  Social Work Consult for Recovery Care Planning/Counseling  Complete  Palliative Care Screening  Not Applicable  Medication Review Oceanographer)  Complete

## 2022-06-15 DIAGNOSIS — G9341 Metabolic encephalopathy: Secondary | ICD-10-CM | POA: Diagnosis not present

## 2022-06-15 MED ORDER — CYANOCOBALAMIN 1000 MCG PO TABS: 1000.0000 ug | ORAL_TABLET | Freq: Every day | ORAL | 0 refills | Status: AC

## 2022-06-15 MED ORDER — ADULT MULTIVITAMIN W/MINERALS CH: 1.0000 | ORAL_TABLET | Freq: Every day | ORAL | 0 refills | Status: AC

## 2022-06-15 MED ORDER — VITAMIN B-1 100 MG PO TABS: 100.0000 mg | ORAL_TABLET | Freq: Every day | ORAL | 0 refills | Status: AC

## 2022-06-15 MED ORDER — FOLIC ACID 1 MG PO TABS: 1.0000 mg | ORAL_TABLET | Freq: Every day | ORAL | 0 refills | Status: AC

## 2022-06-15 MED ORDER — OXYCODONE HCL 5 MG PO TABS: 5.0000 mg | ORAL_TABLET | Freq: Four times a day (QID) | ORAL | 0 refills | Status: AC | PRN

## 2022-06-15 NOTE — TOC Transition Note (Signed)
Transition of Care [facility]) - CM/SW Discharge Note   Patient Details  Name: Dave Silva. MRN: [id number] Date of Birth: 26-Jan-1941  Transition of Care [facility]) CM/SW Contact:  Otelia Santee, LCSW Phone Number: 06/15/2022, 2:46 PM   Clinical Narrative:    Pt no longer recommended for SNF placement. Plan for pt to return to [place] at discharge. Spoke with pt, son, and DIL via t/c to discuss discharge plans. Informed family to contact pt's PCP in [place] for referral to OP rehab or home health. Pt's family shared concerns for pt being able to ambulate up stairs. PT met with pt and was able to work with pt to address this concern. Pt has RW, no further DME needs identified. No further TOC needs identified.    Final next level of care: OP Rehab Barriers to Discharge: Barriers Resolved   Patient Goals and CMS Choice CMS Medicare.gov Compare Post Acute Care list provided to:: Patient Choice offered to / list presented to : Patient  Discharge Placement                         Discharge Plan and Services Additional resources added to the After Visit Summary for   In-house Referral: NA Discharge Planning Services: NA Post Acute Care Choice: Skilled Nursing Facility          DME Arranged: N/A DME Agency: NA                  Social Determinants of Health (SDOH) Interventions SDOH Screenings   Food Insecurity: No Food Insecurity (06/10/2022)  Housing: Low Risk  (06/10/2022)  Transportation Needs: No Transportation Needs (06/10/2022)  Utilities: Not At Risk (06/10/2022)  Depression (PHQ2-9): Low Risk  (01/12/2020)  Tobacco Use: Medium Risk (06/12/2022)     Readmission Risk Interventions    06/13/2022   12:42 PM 06/12/2022    3:45 PM  Readmission Risk Prevention Plan  Transportation Screening Complete Complete  PCP or Specialist Appt within 5-7 Days Complete   PCP or Specialist Appt within 3-5 Days  Complete  Home Care Screening Complete   Medication Review (RN CM) Complete    HRI or Home Care Consult  Complete  Social Work Consult for Recovery Care Planning/Counseling  Complete  Palliative Care Screening  Not Applicable  Medication Review Oceanographer)  Complete

## 2022-06-15 NOTE — Discharge Summary (Signed)
Physician Discharge Summary  Dave Silva. RUE:[id number] DOB: 1940-05-11 DOA: 06/10/2022  PCP: Patient, No Pcp Per  Admit date: 06/10/2022 Discharge date: 06/15/2022  Admitted From: Home Disposition: Home  Recommendations for Outpatient Follow-up:  Follow up with PCP in 1-2 weeks  Home Health: None Equipment/Devices: None  Discharge Condition: Stable CODE STATUS: Full Diet recommendation: As tolerated  Brief/Interim Summary: Dave Silva. is an 82 y.o. male with a history of CAD s/p 4v CABG 2010, asthma/COPD, HTN, dyslipidemia, prostate CA treated w/radiation who presented to the ED on 06/10/2022 with worsening leg weakness, falls and low back pain.  Having recently had an episode of syncope with a fall onto his back while traveling although imaging at that time was negative for fracture.  Pain continue to worsen and patient was admitted for further evaluation and workup due to his profound pain and inability to walk.  While admitted patient had MRI showing severe stenosis at L4.  Orthopedic surgery and spine surgery were consulted to evaluate.  There was discussion about need for possible procedure to decompress the area however over the subsequent 48 hours patient symptoms rapidly improved on current regimen and he is able to ambulate now back to baseline without further symptoms.  In regards to patient's syncopal event cardiology was consulted and did recommend outpatient monitoring given unremarkable findings here and concern for possible dysrhythmia.  Given patient has returned to a different states this will need to be set up when he returns home. Given his return to baseline no longer requiring treatment, procedure or intervention patient is stable and agreeable for discharge home.  Patient is being discharged with family today, ultimate disposition is to return home in [place] to his known primary care team for further follow-up imaging intervention as deemed appropriate.  Discussion on avoidance of alcohol while on narcotics was had at bedside with family and patient.   Discharge Diagnoses:  Principal Problem:   Acute metabolic encephalopathy Active Problems:   AKI (acute kidney injury) (HCC)   Hyponatremia   Syncope, vasovagal   Acute right hip pain   Stage 1 mild COPD by GOLD classification (HCC)   CAD s/p CABGx4 in 2010   GERD (gastroesophageal reflux disease)   Major depression in remission [facility])   Essential hypertension   Bladder neck stricture   Urinary retention    Discharge Instructions   Allergies as of 06/15/2022       Reactions   Other Nausea And Vomiting, Other (See Comments)   Certain "stronger" pain medications make the patient's stomach hurt, also (must have an anti emetic to tolerate)        Medication List     STOP taking these medications    ezetimibe 10 MG tablet Commonly known as: ZETIA   sertraline 50 MG tablet Commonly known as: ZOLOFT       TAKE these medications    albuterol 108 (90 Base) MCG/ACT inhaler Commonly known as: VENTOLIN HFA Inhale 2 puffs into the lungs every 6 (six) hours as needed for wheezing or shortness of breath.   aspirin EC 81 MG tablet Take 81 mg by mouth daily. Swallow whole.   cyanocobalamin 1000 MCG tablet Take 1 tablet (1,000 mcg total) by mouth daily. Start taking on: Jun 16, 2022   Dramamine-N 50 MG Tabs Generic drug: Ginger (Zingiber officinalis) Take 50 mg by mouth See admin instructions. Take 50 mg (1 tablet) by mouth with each dose of a stronger pain medication (to tolerate)   fluticasone  50 MCG/ACT nasal spray Commonly known as: FLONASE Place 1-2 sprays into both nostrils daily as needed for allergies or rhinitis.   folic acid 1 MG tablet Commonly known as: FOLVITE Take 1 tablet (1 mg total) by mouth daily. Start taking on: Jun 16, 2022   gabapentin 100 MG capsule Commonly known as: NEURONTIN Take 100 mg by mouth at bedtime.   magnesium oxide 400 (240 Mg)  MG tablet Commonly known as: MAG-OX Take 400 mg by mouth daily.   montelukast 10 MG tablet Commonly known as: SINGULAIR Take 10 mg by mouth at bedtime.   multivitamin with minerals Tabs tablet Take 1 tablet by mouth daily. Start taking on: Jun 16, 2022   omeprazole 20 MG capsule Commonly known as: PRILOSEC TAKE 1 CAPSULE BY MOUTH EVERY DAY What changed:  how much to take when to take this   oxyCODONE 5 MG immediate release tablet Commonly known as: Roxicodone Take 1 tablet (5 mg total) by mouth every 6 (six) hours as needed for severe pain (MUST TAKE AN ANTIEMETIC TO TOLERATE). What changed:  when to take this reasons to take this   predniSONE 5 MG (21) Tbpk tablet Commonly known as: STERAPRED UNI-PAK 21 TAB Take 5-30 mg by mouth daily. Take 30 mg (6 tablets) by mouth WITH FOOD on day one, then decrease by 5 mg daily until finsihed   rosuvastatin 40 MG tablet Commonly known as: CRESTOR Take 40 mg by mouth daily. What changed: Another medication with the same name was removed. Continue taking this medication, and follow the directions you see here.   Stiolto Respimat 2.5-2.5 MCG/ACT Aers Generic drug: Tiotropium Bromide-Olodaterol Inhale 2 puffs into the lungs daily.   thiamine 100 MG tablet Commonly known as: Vitamin B-1 Take 1 tablet (100 mg total) by mouth daily. Start taking on: Jun 16, 2022   valsartan 80 MG tablet Commonly known as: Diovan Take 1 tablet (80 mg total) by mouth daily. What changed: when to take this   Vitamin D3 50 MCG (2000 UT) Tabs Take 4,000 Units by mouth daily.        Follow-up Information     Windle Guard, MD. Schedule an appointment as soon as possible for a visit in 1 week(s).   Specialty: Anesthesiology Contact information: [address] [place] [address] [phone]                Allergies  Allergen Reactions   Other Nausea And Vomiting and Other (See Comments)    Certain "stronger" pain  medications make the patient's stomach hurt, also (must have an anti emetic to tolerate)    Consultations: Orthopedics, Urology   Procedures/Studies: ECHOCARDIOGRAM COMPLETE  Result Date: 06/11/2022    ECHOCARDIOGRAM REPORT   Patient Name:   Dave Silva. Date of Exam: 06/11/2022 Medical Rec #:  [id number]          Height:       71.0 in Accession #:    [id number]         Weight:       171.0 lb Date of Birth:  11-23-1940          BSA:          1.973 m Patient Age:    82 years           BP:           149/75 mmHg Patient Gender: M  HR:           59 bpm. Exam Location:  Inpatient Procedure: 2D Echo, Cardiac Doppler and Color Doppler Indications:    syncope  History:        Patient has no prior history of Echocardiogram examinations.                 COPD; Risk Factors:Hypertension and Dyslipidemia.  Sonographer:    Mike Gip Referring Phys: [id number] TOCHUKWU AGBATA IMPRESSIONS  1. Left ventricular ejection fraction, by estimation, is 55 to 60%. The left ventricle has normal function. The left ventricle has no regional wall motion abnormalities. Left ventricular diastolic function could not be evaluated.  2. Right ventricular systolic function is normal. The right ventricular size is normal. There is mildly elevated pulmonary artery systolic pressure. The estimated right ventricular systolic pressure is 38.8 mmHg.  3. The mitral valve is degenerative. No evidence of mitral valve regurgitation. No evidence of mitral stenosis. Moderate mitral annular calcification.  4. Tricuspid valve regurgitation is moderate.  5. 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 The aortic valve is calcified. Aortic valve regurgitation is mild. Aortic valve area, by VTI measures 0.79 cm. Aortic valve  mean gradient measures 16.0 mmHg. FINDINGS  Left Ventricle: Left ventricular ejection fraction, by estimation, is 55 to 60%. The left  ventricle has normal function. The left ventricle has no regional wall motion abnormalities. The left ventricular internal cavity size was normal in size. There is  no left ventricular hypertrophy. Left ventricular diastolic function could not be evaluated due to mitral annular calcification (moderate or greater). Left ventricular diastolic function could not be evaluated. Right Ventricle: The right ventricular size is normal. No increase in right ventricular wall thickness. Right ventricular systolic function is normal. There is mildly elevated pulmonary artery systolic pressure. The tricuspid regurgitant velocity is 2.99  m/s, and with an assumed right atrial pressure of 3 mmHg, the estimated right ventricular systolic pressure is 38.8 mmHg. Left Atrium: Left atrial size was normal in size. Right Atrium: Right atrial size was normal in size. Pericardium: There is no evidence of pericardial effusion. Mitral Valve: The mitral valve is degenerative in appearance. Moderate mitral annular calcification. No evidence of mitral valve regurgitation. No evidence of mitral valve stenosis. Tricuspid Valve: The tricuspid valve is normal in structure. Tricuspid valve regurgitation is moderate . No evidence of tricuspid stenosis. Aortic Valve: 2D AVA 1.88 cm2. 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 The aortic valve is calcified. Aortic valve regurgitation is mild. Aortic regurgitation PHT measures 400 msec. Aortic valve mean gradient measures 16.0 mmHg. Aortic valve peak gradient measures 24.3 mmHg. Aortic valve area, by VTI measures 0.79 cm. Pulmonic Valve: The pulmonic valve was not well visualized. Pulmonic valve regurgitation is mild. No evidence of pulmonic stenosis. Aorta: The aortic root and ascending aorta are structurally normal, with no evidence of dilitation. IAS/Shunts: No atrial level shunt detected by color flow Doppler.  LEFT VENTRICLE PLAX 2D  LVIDd:         4.10 cm     Diastology LVIDs:         2.90 cm     LV e' medial:    5.55 cm/s LV PW:         1.00 cm     LV E/e' medial:  14.1 LV IVS:  1.00 cm     LV e' lateral:   8.27 cm/s LVOT diam:     1.80 cm     LV E/e' lateral: 9.5 LV SV:         49 LV SV Index:   25 LVOT Area:     2.54 cm  LV Volumes (MOD) LV vol d, MOD A2C: 90.0 ml LV vol d, MOD A4C: 82.1 ml LV vol s, MOD A2C: 39.1 ml LV vol s, MOD A4C: 31.3 ml LV SV MOD A2C:     50.9 ml LV SV MOD A4C:     82.1 ml LV SV MOD BP:      51.2 ml RIGHT VENTRICLE             IVC RV Basal diam:  3.40 cm     IVC diam: 1.10 cm RV S prime:     11.90 cm/s TAPSE (M-mode): 1.8 cm LEFT ATRIUM             Index        RIGHT ATRIUM           Index LA diam:        2.80 cm 1.42 cm/m   RA Area:     13.80 cm LA Vol (A2C):   40.1 ml 20.33 ml/m  RA Volume:   32.20 ml  16.32 ml/m LA Vol (A4C):   57.6 ml 29.20 ml/m LA Biplane Vol: 51.9 ml 26.31 ml/m  AORTIC VALVE                     PULMONIC VALVE AV Area (Vmax):    0.83 cm      PR End Diast Vel: 3.76 msec AV Area (Vmean):   0.76 cm AV Area (VTI):     0.79 cm AV Vmax:           246.33 cm/s AV Vmean:          177.667 cm/s AV VTI:            0.616 m AV Peak Grad:      24.3 mmHg AV Mean Grad:      16.0 mmHg LVOT Vmax:         80.20 cm/s LVOT Vmean:        53.100 cm/s LVOT VTI:          0.192 m LVOT/AV VTI ratio: 0.31 AI PHT:            400 msec  AORTA Ao Root diam: 3.50 cm Ao Asc diam:  3.30 cm MITRAL VALVE               TRICUSPID VALVE MV Area (PHT): 3.39 cm    TR Peak grad:   35.8 mmHg MV Decel Time: 224 msec    TR Vmax:        299.00 cm/s MV E velocity: 78.40 cm/s MV A velocity: 58.30 cm/s  SHUNTS MV E/A ratio:  1.34        Systemic VTI:  0.19 m                            Systemic Diam: 1.80 cm Riley Lam MD Electronically signed by Riley Lam MD Signature Date/Time: 06/11/2022/1:28:01 PM    Final    MR HIP RIGHT WO CONTRAST  Result Date: 06/10/2022 CLINICAL DATA:  Hip trauma, fracture  suspected, xray done EXAM: MR OF THE RIGHT HIP WITHOUT  CONTRAST TECHNIQUE: Multiplanar, multisequence MR imaging was performed. No intravenous contrast was administered. COMPARISON:  CT 06/07/2022 FINDINGS: Bones: No acute fracture. No dislocation. No femoral head avascular necrosis. Bony pelvis intact without diastasis. SI joints and pubic symphysis within normal limits. No bone marrow edema. No marrow replacing bone lesion. Articular cartilage and labrum Articular cartilage: Areas of moderate chondral thinning and surface irregularity, most pronounced at the anterosuperior aspect of the hip joint. No subchondral marrow signal changes. Labrum: Degenerated, not well assessed in the absence of intra-articular contrast. No paralabral cyst. Joint or bursal effusion Joint effusion:  None. Bursae: No abnormal bursal fluid collection. Muscles and tendons Muscles and tendons: Mild tendinosis of the bilateral gluteus medius and minimus tendons. The hamstring, iliopsoas, rectus femoris, and adductor tendons appear intact without tear or significant tendinosis. Normal muscle bulk without atrophy or fatty infiltration. Other findings Miscellaneous: Mild generalized subcutaneous edema, nonspecific. No fluid collection. No inguinal lymphadenopathy. Trabeculated urinary bladder wall. Colonic diverticulosis. IMPRESSION: 1. No acute osseous abnormality of the right hip. 2. Moderate osteoarthritis of the right hip. 3. Mild tendinosis of the bilateral gluteus medius and minimus tendons. Electronically Signed   By: Duanne Guess D.O.   On: 06/10/2022 18:49   MR LUMBAR SPINE WO CONTRAST  Result Date: 06/10/2022 CLINICAL DATA:  Low back pain, increased fracture risk EXAM: MRI LUMBAR SPINE WITHOUT CONTRAST TECHNIQUE: Multiplanar, multisequence MR imaging of the lumbar spine was performed. No intravenous contrast was administered. COMPARISON:  None Available. FINDINGS: Segmentation:  Standard. Alignment: Grade 1 anterolisthesis of  L4 on L5. Slight retrolisthesis at L5-S1. Mild lumbar levocurvature. Vertebrae: No fracture, evidence of discitis, or bone lesion. Discogenic endplate marrow changes are most pronounced at L2-3 on the right and L5-S1 on the left. Chronic superior endplate Schmorl's node at L5 without associated marrow edema. Conus medullaris and cauda equina: Conus extends to the L1 level. Conus and cauda equina appear normal. Paraspinal and other soft tissues: Urinary bladder is moderately distended with trabeculated wall. No acute abnormality. Disc levels: T12-L1: No significant disc protrusion, foraminal stenosis, or canal stenosis. L1-L2: Minimal disc bulge and mild bilateral facet arthropathy. Borderline-mild canal stenosis. No significant foraminal stenosis. L2-L3: Disc bulge and endplate spurring, eccentric to the right. Moderate bilateral facet arthropathy with ligamentum flavum buckling. Moderate canal stenosis with right-sided subarticular recess stenosis. Mild right foraminal stenosis. L3-L4: Mild annular disc bulge with advanced bilateral facet arthropathy and ligamentum flavum buckling. Moderate canal stenosis and mild bilateral foraminal stenosis. L4-L5: Disc uncovering with diffuse disc bulge. Severe bilateral facet arthropathy with ligamentum flavum buckling and thin synovial cyst at the midline. There is severe canal stenosis with bilateral subarticular recess stenosis. Moderate left foraminal stenosis. L5-S1: Disc bulge and endplate spurring with mild-to-moderate bilateral facet arthropathy. No canal stenosis or foraminal stenosis, however there is mass effect upon the exited bilateral L5 nerve roots by osteophytic endplate ridging. IMPRESSION: 1. 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 Moderate left foraminal stenosis at this level. 2. Moderate canal stenosis at L2-3 and L3-4. 3. Moderately distended urinary bladder  with trabeculated wall, which may be secondary to chronic bladder outlet obstruction versus neurogenic bladder. Electronically Signed   By: Duanne Guess D.O.   On: 06/10/2022 18:40   CT HEAD WO CONTRAST ( )  Result Date: 06/10/2022 CLINICAL DATA:  Trauma, fall EXAM: CT HEAD WITHOUT CONTRAST TECHNIQUE: Contiguous axial images were obtained from the base of the skull through the vertex  without intravenous contrast. RADIATION DOSE REDUCTION: This exam was performed according to the departmental dose-optimization program which includes automated exposure control, adjustment of the mA and/or kV according to patient size and/or use of iterative reconstruction technique. COMPARISON:  None Available. FINDINGS: Brain: No acute intracranial findings are seen. There are no signs of bleeding within the cranium. There is no focal mass effect. Cortical sulci are prominent. There is decreased density in periventricular white matter. Scattered arterial calcifications are seen. Vascular: Scattered arterial calcifications are seen. Skull: No acute findings are seen. Sinuses/Orbits: There is mucosal thickening and possible small air-fluid level in right maxillary sinus. Other: None. IMPRESSION: No acute intracranial findings are seen. Atrophy. 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 Electronically Signed   By: Ernie Avena M.D.   On: 06/10/2022 15:11   DG Chest Portable 1 View  Result Date: 06/10/2022 CLINICAL DATA:  Weakness EXAM: PORTABLE CHEST 1 VIEW COMPARISON:  04/10/2020 FINDINGS: Sternal wires. Normal cardiopericardial silhouette. No pneumothorax or effusion. There is a density along the right side of the mediastinum inferiorly consistent with known history of a hiatal hernia. No edema or consolidation. Overlapping cardiac leads. IMPRESSION: Postop chest.  Hiatal hernia Electronically Signed   By: Karen Kays M.D.   On: 06/10/2022 13:58   VAS [place] LOWER EXTREMITY VENOUS (DVT)  (7a-7p)  Result Date: 06/08/2022  Lower Venous DVT Study Patient Name:  Joniel Kokoszka.  Date of Exam:   06/07/2022 Medical Rec #: [id number]           Accession #:    [id number] Date of Birth: [birth year]           Patient Gender: M Patient Age:   61 years Exam Location:  [facility] Procedure:      VAS [place] LOWER EXTREMITY VENOUS (DVT) Referring Phys: HALEY SAGE --------------------------------------------------------------------------------  Indications: Pain.  Risk Factors: Trauma. Comparison Study: No prior studies. Performing Technologist: Chanda Busing RVT  Examination Guidelines: A complete evaluation includes B-mode imaging, spectral Doppler, color Doppler, and power Doppler as needed of all accessible portions of each vessel. Bilateral testing is considered an integral part of a complete examination. Limited examinations for reoccurring indications may be performed as noted. The reflux portion of the exam is performed with the patient in reverse Trendelenburg.  +---------+---------------+---------+-----------+----------+--------------+ RIGHT    CompressibilityPhasicitySpontaneityPropertiesThrombus Aging +---------+---------------+---------+-----------+----------+--------------+ CFV      Full           Yes      Yes                                 +---------+---------------+---------+-----------+----------+--------------+ SFJ      Full                                                        +---------+---------------+---------+-----------+----------+--------------+ FV Prox  Full                                                        +---------+---------------+---------+-----------+----------+--------------+ FV Mid   Full                                                        +---------+---------------+---------+-----------+----------+--------------+  FV DistalFull                                                         +---------+---------------+---------+-----------+----------+--------------+ PFV      Full                                                        +---------+---------------+---------+-----------+----------+--------------+ POP      Full           Yes      Yes                                 +---------+---------------+---------+-----------+----------+--------------+ PTV      Full                                                        +---------+---------------+---------+-----------+----------+--------------+ PERO     Full                                                        +---------+---------------+---------+-----------+----------+--------------+   +----+---------------+---------+-----------+----------+--------------+ LEFTCompressibilityPhasicitySpontaneityPropertiesThrombus Aging +----+---------------+---------+-----------+----------+--------------+ CFV Full           Yes      Yes                                 +----+---------------+---------+-----------+----------+--------------+     Summary: RIGHT: - There is no evidence of deep vein thrombosis in the lower extremity.  - No cystic structure found in the popliteal fossa.  LEFT: - No evidence of common femoral vein obstruction.  *See table(s) above for measurements and observations. Electronically signed by Heath Lark on 06/08/2022 at 1:35:35 PM.    Final    CT Hip Right Wo Contrast  Result Date: 06/07/2022 CLINICAL DATA:  Proximal right leg pain after fall 05/29/2022 in [place]. Decreased range of motion. EXAM: CT OF THE RIGHT HIP WITHOUT CONTRAST TECHNIQUE: Multidetector CT imaging of the right hip was performed according to the standard protocol. Multiplanar CT image reconstructions were also generated. RADIATION DOSE REDUCTION: This exam was performed according to the departmental dose-optimization program which includes automated exposure control, adjustment of the mA and/or kV according to patient size  and/or use of iterative reconstruction technique. COMPARISON:  Pelvis and right hip radiographs 06/07/2022 FINDINGS: Bones/Joint/Cartilage Mildly decreased bone mineralization. Moderate superomedial right femoroacetabular joint space narrowing with mild superolateral acetabular degenerative osteophytosis.1 No acute fracture is seen. Ligaments Suboptimally assessed by CT. Muscles and Tendons Normal size and density of the regional musculature. Soft tissues No right hip joint effusion. High-grade atherosclerotic calcifications. Mild-to-moderate right fat containing right inguinal hernia with apparent fluid at the distal aspect. IMPRESSION: 1. No acute fracture is seen. 2. Moderate right femoroacetabular  osteoarthritis. 3. Mild-to-moderate right fat containing inguinal hernia. Electronically Signed   By: Neita Garnet M.D.   On: 06/07/2022 15:57   DG Hip Unilat W or Wo Pelvis 2-3 Views Right  Result Date: 06/07/2022 CLINICAL DATA:  Fall, brought in by ambulance for right upper leg pain since fall on 04/17 in [place]. Decreased range of motion. Difficulty walking. EXAM: DG HIP (WITH OR WITHOUT PELVIS) 2-3V RIGHT COMPARISON:  None Available. FINDINGS: There is no evidence of hip fracture or dislocation. Superolateral hip joint space narrowing with marginal spurring. Prominent vascular calcifications. IMPRESSION: 1. No acute fracture or dislocation. MRI examination for further evaluation is recommended if there is high clinical suspicion for a hip fracture. 2. Mild right hip osteoarthritis. Electronically Signed   By: Larose Hires D.O.   On: 06/07/2022 15:11     Subjective: No acute issue/events overnight   Discharge Exam: Vitals:   06/15/22 1404 06/15/22 1407  BP:    Pulse: 72 88  Resp:    Temp:    SpO2: 95% 92%   Vitals:   06/15/22 0426 06/15/22 1342 06/15/22 1404 06/15/22 1407  BP: (!) 158/79 134/83    Pulse: (!) 58 73 72 88  Resp: 20 18    Temp: 97.6 F (36.4 C) 97.6 F (36.4 C)     TempSrc: Oral Oral    SpO2: 94% 94% 95% 92%  Weight:      Height:        General: Pt is alert, awake, not in acute distress Cardiovascular: RRR, S1/S2 +, no rubs, no gallops Respiratory: CTA bilaterally, no wheezing, no rhonchi Abdominal: Soft, NT, ND, bowel sounds + Extremities: no edema, no cyanosis   The results of significant diagnostics from this hospitalization (including imaging, microbiology, ancillary and laboratory) are listed below for reference.     Microbiology: No results found for this or any previous visit (from the past 240 hour(s)).   Labs: BNP (last 3 results) No results for input(s): "BNP" in the last 8760 hours. Basic Metabolic Panel: Recent Labs  Lab 06/10/22 1312 06/11/22 0551 06/12/22 0539 06/13/22 0615 06/14/22 0610  NA 127* 133* 136 134* 136  K 4.5 4.0 4.5 4.5 4.7  CL 98 102 106 103 102  CO2 21* 25 26 25 27   GLUCOSE 100* 100* 99 134* 119*  BUN 31* 33* 24* 21 21  CREATININE 1.54* 1.73* 1.65* 1.58* 1.52*  CALCIUM 8.4* 8.0* 7.8* 8.1* 8.3*   Liver Function Tests: Recent Labs  Lab 06/10/22 1312  AST 19  ALT 15  ALKPHOS 45  BILITOT 0.8  PROT 6.4*  ALBUMIN 3.4*   No results for input(s): "LIPASE", "AMYLASE" in the last 168 hours. No results for input(s): "AMMONIA" in the last 168 hours. CBC: Recent Labs  Lab 06/10/22 1312 06/11/22 0551 06/13/22 0615 06/14/22 0610  WBC 9.8 8.9 12.1* 10.7*  HGB 15.0 14.6 13.9 14.3  HCT 43.4 43.8 41.0 43.2  MCV 101.2* 102.8* 103.5* 103.8*  PLT 137* 136* 137* 148*   Cardiac Enzymes: Recent Labs  Lab 06/10/22 2114  CKTOTAL 34*   BNP: Invalid input(s): "POCBNP" CBG: No results for input(s): "GLUCAP" in the last 168 hours. D-Dimer No results for input(s): "DDIMER" in the last 72 hours. Hgb A1c No results for input(s): "HGBA1C" in the last 72 hours. Lipid Profile No results for input(s): "CHOL", "HDL", "LDLCALC", "TRIG", "CHOLHDL", "LDLDIRECT" in the last 72 hours. Thyroid function  studies No results for input(s): "TSH", "T4TOTAL", "T3FREE", "THYROIDAB" in the last  72 hours.  Invalid input(s): "FREET3" Anemia work up No results for input(s): "VITAMINB12", "FOLATE", "FERRITIN", "TIBC", "IRON", "RETICCTPCT" in the last 72 hours. Urinalysis    Component Value Date/Time   COLORURINE STRAW (A) 06/10/2022 1312   APPEARANCEUR CLEAR 06/10/2022 1312   LABSPEC 1.006 06/10/2022 1312   PHURINE 6.0 06/10/2022 1312   GLUCOSEU NEGATIVE 06/10/2022 1312   HGBUR SMALL (A) 06/10/2022 1312   BILIRUBINUR NEGATIVE 06/10/2022 1312   KETONESUR NEGATIVE 06/10/2022 1312   PROTEINUR NEGATIVE 06/10/2022 1312   NITRITE NEGATIVE 06/10/2022 1312   LEUKOCYTESUR NEGATIVE 06/10/2022 1312   Sepsis Labs Recent Labs  Lab 06/10/22 1312 06/11/22 0551 06/13/22 0615 06/14/22 0610  WBC 9.8 8.9 12.1* 10.7*   Microbiology No results found for this or any previous visit (from the past 240 hour(s)).   Time coordinating discharge: Over 30 minutes  SIGNED:   Azucena Fallen, DO [facility] 06/15/2022, 5:53 PM Pager   If 7PM-7AM, please contact night-coverage [email or web address]

## 2022-06-15 NOTE — Progress Notes (Signed)
SATURATION QUALIFICATIONS: (This note is used to comply with regulatory documentation for home oxygen)  Patient Saturations on Room Air at Rest = 96%  Patient Saturations on Room Air while Ambulating = 92%   Please briefly explain why patient needs home oxygen:Patient sustained between 92 to 93 while walking in the hallway in room air and he got some dyspnea when get back to room . Now his oxygen level is 98 in room air

## 2022-06-15 NOTE — Progress Notes (Signed)
Physical Therapy Treatment Patient Details Name: Dave Silva. MRN: [id number] DOB: February 25, 1940 Today's Date: 06/15/2022   History of Present Illness 82 y/o male admitted 06/10/22 with fall, weakness, and leg pain. Dx with acute metabolic encephalopathy. There is some concern for neurologic impingement (ortho following) and he was found to be in urinary retention. Now s/p Cystoscopy with urethral dilatation. Orthopedics following for possible surgery. PMH includes asthma/COPD, hypertension, dyslipidemia, coronary artery disease status post CABG x 4, Cancer.    PT Comments    Pt agreeable to working with therapy. Per TOC, family expressed concerns about pt being able to navigate stairs. Pt ascended/descended 6 stairs with use of 1 handrail. He also walked ~115 feet with a RW. Noticed he had some dyspnea after practicing stairs so assessed O2 sats: 84% on RA (sats were 94% on RA just prior to my arrival-NT had just checked them). Pt reported pain as controlled throughout session. Pt is hopeful to d/c home. I do feel he will benefit from continued rehab for his back pain and general weakness.    Recommendations for follow up therapy are one component of a multi-disciplinary discharge planning process, led by the attending physician.  Recommendations may be updated based on patient status, additional functional criteria and insurance authorization.  Follow Up Recommendations  Can patient physically be transported by private vehicle: No    Assistance Recommended at Discharge Intermittent Supervision/Assistance  Patient can return home with the following Assist for transportation;Assistance with cooking/housework;Help with stairs or ramp for entrance;A little help with walking and/or transfers;A little help with bathing/dressing/bathroom   Equipment Recommendations   (pt stated he has a walker)    Recommendations for Other Services       Precautions / Restrictions Precautions Precautions:  None Precaution Comments: back precautions for comfort/L4 L5 Restrictions Weight Bearing Restrictions: No     Mobility  Bed Mobility               General bed mobility comments: oob in recliner    Transfers Overall transfer level: Needs assistance Equipment used: Rolling walker (2 wheels) Transfers: Sit to/from Stand Sit to Stand: Supervision           General transfer comment: Supv for safety. No assist needed.    Ambulation/Gait   Gait Distance (Feet): 115 Feet Assistive device: Rolling walker (2 wheels) Gait Pattern/deviations: Step-through pattern, Decreased stride length       General Gait Details: Supv for safety. No LOB with RW. Pt denied dizziness.   Stairs Stairs: Yes Stairs assistance: Min guard Stair Management: Step to pattern, One rail Right, Forwards Number of Stairs: 6 General stair comments: cues for step to pattern for safety. no assist needed.   Wheelchair Mobility    Modified Rankin (Stroke Patients Only)       Balance Overall balance assessment: Needs assistance         Standing balance support: During functional activity, Reliant on assistive device for balance Standing balance-Leahy Scale: Poor                              Cognition Arousal/Alertness: Awake/alert Behavior During Therapy: WFL for tasks assessed/performed Overall Cognitive Status: Within Functional Limits for tasks assessed                                 General Comments: Pleasant, motivated  Exercises      General Comments        Pertinent Vitals/Pain Pain Assessment Pain Assessment: Faces Faces Pain Scale: Hurts little more Pain Location: low back, legs Pain Descriptors / Indicators: Discomfort, Sore, Aching Pain Intervention(s): Limited activity within patient's tolerance, Monitored during session    Home Living                          Prior Function            PT Goals (current goals  can now be found in the care plan section) Progress towards PT goals: Progressing toward goals    Frequency    Min 1X/week      PT Plan Discharge plan needs to be updated    Co-evaluation              AM-PAC PT "6 Clicks" Mobility   Outcome Measure  Help needed turning from your back to your side while in a flat bed without using bedrails?: None Help needed moving from lying on your back to sitting on the side of a flat bed without using bedrails?: A Little Help needed moving to and from a bed to a chair (including a wheelchair)?: A Little Help needed standing up from a chair using your arms (e.g., wheelchair or bedside chair)?: A Little Help needed to walk in hospital room?: A Little Help needed climbing 3-5 steps with a railing? : A Little 6 Click Score: 19    End of Session Equipment Utilized During Treatment: Gait belt Activity Tolerance: Patient tolerated treatment well Patient left: in chair;with call bell/phone within reach   PT Visit Diagnosis: Pain;Difficulty in walking, not elsewhere classified (R26.2);History of falling (Z91.81);Repeated falls (R29.6)     Time: 1610-9604 PT Time Calculation (min) (ACUTE ONLY): 12 min  Charges:  $Gait Training: 8-22 mins                         Faye Ramsay, PT Acute Rehabilitation  Office: [phone]

## 2022-06-17 ENCOUNTER — Telehealth: Admit: 2022-06-17 | Payer: PRIVATE HEALTH INSURANCE | Attending: Internal Medicine | Primary: Internal Medicine

## 2022-06-17 SURGERY — LUMBAR LAMINECTOMY/DECOMPRESSION MICRODISCECTOMY 1 LEVEL
Anesthesia: General

## 2022-06-19 ENCOUNTER — Ambulatory Visit: Admit: 2022-06-19 | Payer: MEDICARE | Primary: Internal Medicine

## 2022-06-19 ENCOUNTER — Inpatient Hospital Stay
Admit: 2022-06-19 | Discharge: 2022-06-22 | Payer: MEDICARE | Attending: Internal Medicine | Admitting: Internal Medicine

## 2022-06-19 ENCOUNTER — Inpatient Hospital Stay: Admit: 2022-06-19 | Payer: MEDICARE

## 2022-06-19 DIAGNOSIS — M4800 Spinal stenosis, site unspecified: Secondary | ICD-10-CM

## 2022-06-19 LAB — CBC WITH AUTO DIFFERENTIAL
BKR WAM ABSOLUTE IMMATURE GRANULOCYTES.: 0.04 x 1000/ÂµL (ref 0.00–0.30)
BKR WAM ABSOLUTE LYMPHOCYTE COUNT.: 1.3 x 1000/ÂµL (ref 0.60–3.70)
BKR WAM ABSOLUTE NRBC (2 DEC): 0 x 1000/ÂµL (ref 0.00–1.00)
BKR WAM ANALYZER ANC: 7.85 x 1000/ÂµL — ABNORMAL HIGH (ref 2.00–7.60)
BKR WAM BASOPHIL ABSOLUTE COUNT.: 0.06 x 1000/ÂµL (ref 0.00–1.00)
BKR WAM BASOPHILS: 0.6 % (ref 0.0–1.4)
BKR WAM EOSINOPHIL ABSOLUTE COUNT.: 0.06 x 1000/ÂµL (ref 0.00–1.00)
BKR WAM EOSINOPHILS: 0.6 % (ref 0.0–5.0)
BKR WAM HEMATOCRIT (2 DEC): 46.6 % (ref 38.50–50.00)
BKR WAM HEMOGLOBIN: 15.5 g/dL (ref 13.2–17.1)
BKR WAM IMMATURE GRANULOCYTES: 0.4 % (ref 0.0–1.0)
BKR WAM LYMPHOCYTES: 12.7 % — ABNORMAL LOW (ref 17.0–50.0)
BKR WAM MCH (PG): 34.2 pg — ABNORMAL HIGH (ref 27.0–33.0)
BKR WAM MCHC: 33.3 g/dL (ref 31.0–36.0)
BKR WAM MCV: 102.9 fL — ABNORMAL HIGH (ref 80.0–100.0)
BKR WAM MONOCYTE ABSOLUTE COUNT.: 0.95 x 1000/ÂµL (ref 0.00–1.00)
BKR WAM MONOCYTES: 9.3 % (ref 4.0–12.0)
BKR WAM MPV: 11.1 fL (ref 8.0–12.0)
BKR WAM NEUTROPHILS: 76.4 % — ABNORMAL HIGH (ref 39.0–72.0)
BKR WAM NUCLEATED RED BLOOD CELLS: 0 % (ref 0.0–1.0)
BKR WAM PLATELETS: 150 x1000/ÂµL (ref 150–420)
BKR WAM RDW-CV: 13.3 % (ref 11.0–15.0)
BKR WAM RED BLOOD CELL COUNT.: 4.53 M/ÂµL (ref 4.00–6.00)
BKR WAM WHITE BLOOD CELL COUNT: 10.3 x1000/ÂµL (ref 4.0–11.0)

## 2022-06-19 LAB — COMPREHENSIVE METABOLIC PANEL
BKR A/G RATIO: 0.9 — ABNORMAL LOW (ref 1.0–2.2)
BKR ALANINE AMINOTRANSFERASE (ALT): 20 U/L (ref 14–63)
BKR ALBUMIN: 2.8 g/dL — ABNORMAL LOW (ref 3.4–5.0)
BKR ALKALINE PHOSPHATASE: 54 U/L (ref 46–116)
BKR ANION GAP: 9 (ref 5–16)
BKR ASPARTATE AMINOTRANSFERASE (AST): 18 U/L (ref 10–42)
BKR AST/ALT RATIO: 0.9
BKR BILIRUBIN TOTAL: 0.4 mg/dL (ref 0.2–1.2)
BKR BLOOD UREA NITROGEN: 25 mg/dL — ABNORMAL HIGH (ref 7–18)
BKR BUN / CREAT RATIO: 15.3 (ref 8.0–23.0)
BKR CALCIUM: 8.2 mg/dL — ABNORMAL LOW (ref 8.5–10.5)
BKR CHLORIDE: 104 mmol/L (ref 98–107)
BKR CO2: 26 mmol/L (ref 21–32)
BKR CREATININE: 1.63 mg/dL — ABNORMAL HIGH (ref 0.80–1.30)
BKR EGFR, CREATININE (CKD-EPI 2021): 42 mL/min/{1.73_m2} — ABNORMAL LOW (ref >=60)
BKR GLOBULIN: 3.1 g/dL (ref 2.3–3.5)
BKR GLUCOSE: 92 mg/dL (ref 70–100)
BKR POTASSIUM: 4.3 mmol/L (ref 3.5–5.1)
BKR PROTEIN TOTAL: 5.9 g/dL — ABNORMAL LOW (ref 6.0–8.0)
BKR SODIUM: 139 mmol/L (ref 136–145)

## 2022-06-19 LAB — URINALYSIS WITH CULTURE REFLEX      (BH LMW YH)
BKR BILIRUBIN, UA: NEGATIVE
BKR BLOOD, UA: NEGATIVE
BKR GLUCOSE, UA: NEGATIVE
BKR LEUKOCYTE ESTERASE, UA: NEGATIVE
BKR NITRITE, UA: NEGATIVE
BKR PH, UA: 6 (ref 5.5–7.5)
BKR SPECIFIC GRAVITY, UA: 1.025 (ref 1.005–1.030)
BKR UROBILINOGEN, UA (MG/DL): <2 mg/dL (ref <=2.0)

## 2022-06-19 LAB — PROTIME AND INR
BKR INR: 1.08 — ABNORMAL HIGH (ref 0.93–1.07)
BKR PROTHROMBIN TIME: 10.9 s (ref 9.5–12.1)

## 2022-06-19 LAB — UA REFLEX CULTURE

## 2022-06-19 LAB — MAGNESIUM: BKR MAGNESIUM: 2 mg/dL (ref 1.8–2.5)

## 2022-06-19 MED ORDER — SODIUM CHLORIDE 0.9 % (FLUSH) INJECTION SYRINGE
0.9 % | INTRAVENOUS | Status: DC | PRN
Start: 2022-06-19 — End: 2022-06-22

## 2022-06-19 MED ORDER — CEFTRIAXONE IV PUSH 1000 MG VIAL & NS (ADULTS)
INTRAVENOUS | Status: DC
Start: 2022-06-19 — End: 2022-06-21
  Administered 2022-06-20 – 2022-06-21 (×2): 10.000 mL via INTRAVENOUS

## 2022-06-19 MED ORDER — TIOTROPIUM 2.5 MCG-OLODATEROL 2.5 MCG/ACTUATION MIST FOR INHALATION
2.5-2.5 mcg/actuation | Freq: Every day | RESPIRATORY_TRACT | Status: DC
Start: 2022-06-19 — End: 2022-06-22
  Administered 2022-06-20 – 2022-06-22 (×3): via RESPIRATORY_TRACT

## 2022-06-19 MED ORDER — SODIUM CHLORIDE 0.9 % BOLUS (NEW BAG)
0.9 % | Freq: Once | INTRAVENOUS | Status: CP
Start: 2022-06-19 — End: ?
  Administered 2022-06-19: 22:00:00 0.9 mL/h via INTRAVENOUS

## 2022-06-19 MED ORDER — ASPIRIN 81 MG TABLET,DELAYED RELEASE
81 mg | Freq: Every day | ORAL | Status: DC
Start: 2022-06-19 — End: 2022-06-22
  Administered 2022-06-20 – 2022-06-22 (×3): 81 mg via ORAL

## 2022-06-19 MED ORDER — ACETAMINOPHEN 325 MG TABLET
325 mg | Freq: Four times a day (QID) | ORAL | Status: DC | PRN
Start: 2022-06-19 — End: 2022-06-22

## 2022-06-19 MED ORDER — IPRATROPIUM 0.5 MG-ALBUTEROL 3 MG (2.5 MG BASE)/3 ML NEBULIZATION SOLN
0.5-32.53 mg-3 mg(2.5 mg base)/3 mL | RESPIRATORY_TRACT | Status: DC | PRN
Start: 2022-06-19 — End: 2022-06-21

## 2022-06-19 MED ORDER — LOSARTAN 50 MG TABLET
50 mg | Freq: Every day | ORAL | Status: DC
Start: 2022-06-19 — End: 2022-06-22

## 2022-06-19 MED ORDER — PREDNISONE 20 MG TABLET
20 mg | Freq: Every day | ORAL | Status: DC
Start: 2022-06-19 — End: 2022-06-22
  Administered 2022-06-20 – 2022-06-22 (×4): 20 mg via ORAL

## 2022-06-19 MED ORDER — SODIUM CHLORIDE 0.9 % (FLUSH) INJECTION SYRINGE
0.9 % | Freq: Three times a day (TID) | INTRAVENOUS | Status: DC
Start: 2022-06-19 — End: 2022-06-22
  Administered 2022-06-21 – 2022-06-22 (×4): 0.9 mL via INTRAVENOUS

## 2022-06-19 MED ORDER — ONDANSETRON 4 MG DISINTEGRATING TABLET
4 mg | Freq: Four times a day (QID) | ORAL | Status: DC | PRN
Start: 2022-06-19 — End: 2022-06-22

## 2022-06-19 MED ORDER — PANTOPRAZOLE 40 MG TABLET,DELAYED RELEASE
40 mg | Freq: Every day | ORAL | Status: DC
Start: 2022-06-19 — End: 2022-06-22
  Administered 2022-06-20 – 2022-06-22 (×3): 40 mg via ORAL

## 2022-06-19 MED ORDER — ONDANSETRON HCL (PF) 4 MG/2 ML INJECTION SOLUTION
42 mg/2 mL | Freq: Four times a day (QID) | INTRAVENOUS | Status: DC | PRN
Start: 2022-06-19 — End: 2022-06-22

## 2022-06-19 MED ORDER — GABAPENTIN 100 MG CAPSULE
100 mg | Freq: Every evening | ORAL | Status: DC
Start: 2022-06-19 — End: 2022-06-22
  Administered 2022-06-20 – 2022-06-22 (×3): 100 mg via ORAL

## 2022-06-19 MED ORDER — SODIUM CHLORIDE 0.9 % INTRAVENOUS SOLUTION
INTRAVENOUS | Status: DC
Start: 2022-06-19 — End: 2022-06-21
  Administered 2022-06-20 – 2022-06-21 (×3): via INTRAVENOUS

## 2022-06-19 MED ORDER — MORPHINE 2 MG/ML INJECTION SYRINGE
2 mg/mL | SUBCUTANEOUS | Status: DC | PRN
Start: 2022-06-19 — End: 2022-06-21

## 2022-06-19 MED ORDER — MAGNESIUM OXIDE 400 MG (241.3 MG MAGNESIUM) TABLET
400241.3 mg (241.3 mg magnesium) | Freq: Every day | ORAL | Status: DC
Start: 2022-06-19 — End: 2022-06-22
  Administered 2022-06-20 – 2022-06-22 (×3): 400 mg (241.3 mg magnesium) via ORAL

## 2022-06-19 MED ORDER — HYDROMORPHONE 2 MG/ML INJECTION SOLUTION
2 mg/mL | SUBCUTANEOUS | Status: DC | PRN
Start: 2022-06-19 — End: 2022-06-21

## 2022-06-19 MED ORDER — OXYCODONE-ACETAMINOPHEN 5 MG-325 MG TABLET
5-325 mg | Freq: Once | ORAL | Status: CP
Start: 2022-06-19 — End: ?
  Administered 2022-06-19: 19:00:00 5-325 mg via ORAL

## 2022-06-19 MED ORDER — ENOXAPARIN 40 MG/0.4 ML SUBCUTANEOUS SYRINGE
400.4 mg/0.4 mL | SUBCUTANEOUS | Status: DC
Start: 2022-06-19 — End: 2022-06-22
  Administered 2022-06-20 – 2022-06-22 (×3): 40 mg/0.4 mL via SUBCUTANEOUS

## 2022-06-19 MED ORDER — ROSUVASTATIN 40 MG TABLET
40 mg | Freq: Every day | ORAL | Status: DC
Start: 2022-06-19 — End: 2022-06-22
  Administered 2022-06-20 – 2022-06-22 (×3): 40 mg via ORAL

## 2022-06-19 NOTE — ED Provider Notes
Chief Complaint Patient presents with ? Back Pain   Recently in [place] and had syncopal episode where he sustained a spine injury at that time. Pt was evaluated at a hospital there and then again when he flew home to the [place]. Pt now is staying here w/his other son and is having mobility issues. Pt went to orthopedic walk-in and was referred here after x-rays were performed. Pt states he had some urinary retention but that has since resolved. ? Urinary Retention   Recently in [place] and had syncopal episode where he sustained a spine injury at that time. Pt was evaluated at a hospital there and then again when he flew home to the [place]. Pt now is staying here w/his other son and is having mobility issues. Pt went to orthopedic walk-in and was referred here after x-rays were performed. Pt states he had some urinary retention but that has since resolved. HPI/PE:82 yo male PMH: CAD s/p CABG 4v 2010, asthma, copd, HTN dyslipidemia, prostate CA. He arrives with his son who he resides with. In April the patient went to [place]. He was at lunch and started sneezing uncontrollably he then had a syncopal episode falling out of his chair onto jagged rocks. He reports he immediately awakened and did not have pain. He was able to walk afterward. He returned to the [place] in [place] and went to [facility] in [place] where he was admitted on 06/10/22 for worsening leg weakness, falls and low back pain and inability to walk discharged on 06/15/22. 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 Orthopedic surgery and spine surgery were consulted to evaluate. A possible procedure was considered to decompress the area after 48 hours patient symptoms the patient's symptoms rapidly improved on regimen of prednisone and pain medication he was then able to ambulate back to baseline. The patient reports that since the medications have been completed has has not been able to walk again at home. His son is concerned for his father's safety at home as he and his wife both work. His son took him to orthopedic walk in who advised him to come to the ED for admission, possible medications and referral to ST rehab. The patient reports he was admitted to [facility] in the past when he had COVID and he felt they did a good job. Of note patient no longer has urinary retention. Re: urinary retention: pt had cystoscopy with dilation of stricture and Foley catheter placement during his last admission in [place].Past Surgical History:2010: CORONARY ARTERY BYPASS GRAFT  Physical ExamED Triage Vitals [06/19/22 1133]BP: 128/80Pulse: 82Pulse from  O2 sat: n/aResp: 16Temp: 98.2 ?F (36.8 ?C)Temp src: OralSpO2: 95 % BP 128/80  - Pulse 82  - Temp 98.2 ?F (36.8 ?C) (Oral)  - Resp 16  - Ht 5' 11 (1.803 m)  - Wt 80.3 kg (177 lb)  - SpO2 95%  - BMI 24.69 kg/m? Physical ExamVitals and nursing note reviewed. Constitutional:     General: He is not in acute distress.   Appearance: Normal appearance. HENT:    Head: Normocephalic and atraumatic.    Nose: Nose normal.    Mouth/Throat:    Mouth: Mucous membranes are moist. Eyes:    General: No scleral icterus.   Pupils: Pupils are equal, round, and reactive to light. Cardiovascular:    Rate and Rhythm: Normal rate and regular rhythm.    Pulses: Normal pulses.    Heart  sounds: Normal heart sounds. Pulmonary:    Effort: Pulmonary effort is normal.    Breath sounds: Normal breath sounds. Abdominal:    Tenderness: There is no abdominal tenderness. Musculoskeletal:       General: Normal range of motion.    Cervical back: Normal range of motion and neck supple.    Right lower leg: No edema.    Left lower leg: No edema. Skin:   General: Skin is warm and dry.    Capillary Refill: Capillary refill takes less than 2 seconds. Neurological: General: No focal deficit present.    Mental Status: He is alert and oriented to person, place, and time.    Cranial Nerves: No cranial nerve deficit.    Sensory: No sensory deficit.    Motor: No weakness. Psychiatric:       Mood and Affect: Mood normal.       Behavior: Behavior normal.  ProceduresAttestation/Critical CarePatient Reevaluation: EKG: NSRNo leukocytosisCT requested by hospitalist prior to admissionCT Lumbar Spine wo IV Contrast Final Result  No acute osseous abnormality of the bony pelvis or lumbar spine.    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     [facility] Notify System Classification: Routine. (accession [id number]), Routine. (accession [id number])    Report initiated by:  Lesia Hausen, MD    Reported and signed by: Vivianne Spence, MD      [facility] Radiology and Biomedical Imaging   [place] Hip Right wo IV Contrast Final Result  No acute osseous abnormality of the bony pelvis or lumbar spine.    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     [facility] Notify System Classification: Routine. (accession [id number]), Routine. (accession [id number])    Report initiated by:  Lesia Hausen, MD    Reported and signed by: Vivianne Spence, MD      [facility] Radiology and Biomedical Imaging   Clinical Impressions as of 06/19/22 1945 Spinal stenosis  ED DispositionObservation Zollie Pee, APRN05/08/24 1940

## 2022-06-20 LAB — COMPREHENSIVE METABOLIC PANEL
BKR A/G RATIO: 0.9 — ABNORMAL LOW (ref 1.0–2.2)
BKR ALANINE AMINOTRANSFERASE (ALT): 22 U/L (ref 14–63)
BKR ALBUMIN: 2.7 g/dL — ABNORMAL LOW (ref 3.4–5.0)
BKR ALKALINE PHOSPHATASE: 55 U/L (ref 46–116)
BKR ANION GAP: 9 (ref 5–16)
BKR ASPARTATE AMINOTRANSFERASE (AST): 20 U/L (ref 10–42)
BKR AST/ALT RATIO: 0.9
BKR BLOOD UREA NITROGEN: 27 mg/dL — ABNORMAL HIGH (ref 7–18)
BKR BUN / CREAT RATIO: 19.7 (ref 8.0–23.0)
BKR CHLORIDE: 102 mmol/L (ref 98–107)
BKR CREATININE: 1.37 mg/dL — ABNORMAL HIGH (ref 0.80–1.30)
BKR EGFR, CREATININE (CKD-EPI 2021): 52 mL/min/{1.73_m2} — ABNORMAL LOW (ref >=60)
BKR GLOBULIN: 3.1 g/dL (ref 2.3–3.5)
BKR GLUCOSE: 150 mg/dL — ABNORMAL HIGH (ref 70–100)
BKR POTASSIUM: 4.7 mmol/L (ref 3.5–5.1)
BKR PROTEIN TOTAL: 5.8 g/dL — ABNORMAL LOW (ref 6.0–8.0)
BKR SODIUM: 134 mmol/L — ABNORMAL LOW (ref 136–145)
BKR WAM EOSINOPHIL ABSOLUTE COUNT.: 0.9 x 1000/??L (ref 0.00–1.00)
BKR WAM EOSINOPHILS: 2.7 g/dL — ABNORMAL LOW (ref 3.4–5.0)

## 2022-06-20 LAB — CBC WITH AUTO DIFFERENTIAL
BKR BILIRUBIN TOTAL: 0 % (ref 0.0–1.4)
BKR CALCIUM: 83.6 % — ABNORMAL HIGH (ref 39.0–72.0)
BKR CO2: 34.3 pg — ABNORMAL HIGH (ref 27.0–33.0)
BKR WAM ABSOLUTE IMMATURE GRANULOCYTES.: 0.02 x 1000/??L (ref 0.00–0.30)
BKR WAM ABSOLUTE LYMPHOCYTE COUNT.: 0.79 x 1000/ÂµL (ref 0.60–3.70)
BKR WAM ABSOLUTE NRBC (2 DEC): 0 x 1000/??L (ref 0.00–1.00)
BKR WAM ANALYZER ANC: 4.93 x 1000/ÂµL (ref 2.00–7.60)
BKR WAM BASOPHIL ABSOLUTE COUNT.: 0 x 1000/ÂµL (ref 0.00–1.00)
BKR WAM BASOPHILS: 0 % (ref 0.0–1.4)
BKR WAM HEMATOCRIT (2 DEC): 43.8 % (ref 38.50–50.00)
BKR WAM HEMOGLOBIN: 14.8 g/dL — ABNORMAL LOW (ref 13.2–17.1)
BKR WAM IMMATURE GRANULOCYTES: 0.3 % (ref 0.0–1.0)
BKR WAM LYMPHOCYTES: 13.4 % — ABNORMAL LOW (ref 17.0–50.0)
BKR WAM MCH (PG): 34.3 pg — ABNORMAL HIGH (ref 27.0–33.0)
BKR WAM MCHC: 33.8 g/dL (ref 31.0–36.0)
BKR WAM MCV: 101.4 fL — ABNORMAL HIGH (ref 80.0–100.0)
BKR WAM MONOCYTE ABSOLUTE COUNT.: 0.16 x 1000/ÂµL (ref 0.00–1.00)
BKR WAM MONOCYTES: 2.7 % — ABNORMAL LOW (ref 4.0–12.0)
BKR WAM MPV: 11.7 fL — ABNORMAL HIGH (ref 8.0–12.0)
BKR WAM NEUTROPHILS: 83.6 % — ABNORMAL HIGH (ref 39.0–72.0)
BKR WAM NUCLEATED RED BLOOD CELLS: 0 % (ref 0.0–1.0)
BKR WAM PLATELETS: 143 x1000/ÂµL — ABNORMAL LOW (ref 150–420)
BKR WAM RDW-CV: 12.9 % (ref 11.0–15.0)
BKR WAM RED BLOOD CELL COUNT.: 4.32 M/ÂµL (ref 4.00–6.00)
BKR WAM WHITE BLOOD CELL COUNT: 5.9 x1000/ÂµL (ref 4.0–11.0)

## 2022-06-20 LAB — LEGIONELLA AND S. PNEUMONIAE ANTIGEN, URINE     (BH GH LMW YH)
BKR LEGIONELLA ANTIGEN: NEGATIVE
BKR S. PNEUMONIAE URINE ANTIGEN: NEGATIVE

## 2022-06-20 LAB — MAGNESIUM: BKR MAGNESIUM: 1.9 mg/dL (ref 1.8–2.5)

## 2022-06-20 LAB — PROCALCITONIN     (BH GH LMW Q YH): BKR PROCALCITONIN: 0.09 ng/mL (ref 0.00–1.00)

## 2022-06-20 MED ORDER — PREDNISONE 5 MG TABLETS IN A DOSE PACK
5 mg | ORAL | Status: AC
Start: 2022-06-20 — End: 2022-06-22

## 2022-06-20 MED ORDER — ACETAMINOPHEN 500 MG TABLET
500 mg | Freq: Three times a day (TID) | ORAL | Status: DC
Start: 2022-06-20 — End: 2022-06-22
  Administered 2022-06-20 – 2022-06-22 (×5): 500 mg via ORAL

## 2022-06-20 MED ORDER — KETOROLAC 30 MG/ML (1 ML) INJECTION SOLUTION
301 mg/mL (1 mL) | Freq: Four times a day (QID) | INTRAVENOUS | Status: DC | PRN
Start: 2022-06-20 — End: 2022-06-20

## 2022-06-20 MED ORDER — ALBUTEROL SULFATE HFA 90 MCG/ACTUATION AEROSOL INHALER
90 mcg/actuation | Freq: Four times a day (QID) | RESPIRATORY_TRACT | Status: AC | PRN
Start: 2022-06-20 — End: ?

## 2022-06-20 MED ORDER — OXYCODONE IMMEDIATE RELEASE 5 MG TABLET
5 mg | Freq: Four times a day (QID) | ORAL | Status: AC | PRN
Start: 2022-06-20 — End: ?

## 2022-06-20 MED ORDER — GABAPENTIN 100 MG CAPSULE
100 mg | Freq: Every evening | ORAL | Status: AC
Start: 2022-06-20 — End: ?

## 2022-06-20 NOTE — Plan of Care
Plan of Care Overview/ Patient Status    Inpatient Physical Therapy Evaluation IP Adult PT Eval/Treat - 06/20/22 1105    Date of Visit / Treatment  Date of Visit / Treatment 06/20/22   1/4  Note Type Evaluation   Start Time 1041   End Time 1105   Total Treatment Time 24    General Information  Pertinent History Of Current Problem Per chart, patient is an  82 y.o. male with asthma COPD not on home O2 hypertension hyperlipidemia coronary artery disease status post four-vessel CABG prostate cancer status post radiation and recent admission and discharge for severe spinal stenosis who comes to the ED complaining of worsening back pain since discharge and so bad he can not walk.  No bowel or bladder incontinence or decreased sensation.  No chest pain and no shortness a breath.  Of note unless admission at other hospital Orthopedics and spine surgery were consulted and the plan was for a procedure but the patient improved symptomatically steroids and was able to walk and thus was discharged.  He was discharged on a steroid taper patient is unclear how much he is taking since his family members are the ones giving him his medication.  Earlier today he went to orthopedic walk-in and they did an x-ray and told him to come to the ED. [place] did show redemonstration of severe L4-L5 stenosis secondary to disc bulge.   Subjective I want to walk out of here.   Other Providers rehab tech   General Observations RN Alden Server cleared patient for participation. Patient received reclined in bed, in NAD, agreeable to PT intervention.   Precautions/Limitations Fall Precautions;Oxygen therapy device   Precautions/Limitations Comment 1L O2 via [place]   Fall History yes, in the past month    Weight Bearing Status  Weight Bearing Status Comments WBAT    Prior Level of Functioning/Social History  Prior Level of Function independent with mobility   Patient resides with: Adult Child(ren)   Type of Home 2 story house   Home Setup Steps to enter with rail(s)   5 stairs inside with rail  Equipment Utilized Prior to Admission/Treatment No device    Vital Signs and Orthostatic Vital Signs  Vital Signs Free text On 1L at rest, SpO2 95%. Patient ambulated on RA, SpO2 86% which increased to 90% once placed back on supplemental O2 and cued for PLB.    Pain/Comfort  Location #1 - PreTreatment Rating (Numbers Scale) 2/10   Posttreatment Rating (Numbers Scale) 2/10   Pain Location - Orientation lower   Pain Location back   Pain Comment (Pre/Post Treatment Pain) RN aware.    Patient Coping  Observed Emotional State accepting;calm;cooperative    Cognition  Overall Cognitive Status WFL   Orientation Level Oriented X4    Range of Motion  Range of Motion Examination bilateral lower extremity ROM was [facility]    Manual Muscle Testing  Manual Muscle Testing Comments BLE strength at least 3/5 as demonstrated by mobility/function.    Skin Assessment  Skin Assessment See Nursing Documentation    Balance  Sitting Balance: Static  FAIR+     Maintains static position without assist or device, may require Supervision or Verbal Cues (>2 minutes)   Sitting Balance: Dynamic  FAIR      Performs dynamic activities through 75% range Contact Guard or partial range (50-75%) with Supervision   Standing Balance: Static FAIR       Maintains static position without assist or device, may require Supervision  or Verbal Cues (<2 minutes)   Standing Balance: Dynamic  FAIR-     Performs dynamic activities through partial range (50-75%) with Contact Guard   Balance Assist Device Rolling walker;Gait belt    Bed Mobility  Supine-to-Sit Independence/Assistance Level Stand-by assist;Verbal cues   Supine-to-Sit Assist Device Bed rails;Head of bed elevated   Sit-to-Supine Independence/Assistance Level Stand-by assist   Sit-to-Supine Assist Device Bed rails   Bed Mobility, Impairments pain   Bed mobility was limited by: pain    Sit-Stand Transfer Training  Sit-to-Stand Transfer Independence/Assistance Level Contact guard;Assist of 1;Verbal cues;Nonverbal cues   Sit-to-Stand Transfer Assist Device Rolling walker;Gait belt   Stand-to-Sit Transfer Independence/Assistance Level Contact guard;Assist of 1;Verbal cues   Stand-to-Sit Transfer Assist Device Rolling walker;Gait belt   Transfer Safety Analysis Concerns cues for hand placement;decreased step length;decreased sequencing ability   Transfer Safety Analysis Impairments impaired balance;pain;decreased strength   Transfers were limited by: impaired motor function;pain   Sit-Stand Transfer Comments Transfer training performed with patient. Education and VC provided for sequencing and strategy to maximize safety and complete with proper form using RW.    Gait Training  Independence/Assistance Level  Contact guard;Assist of 1;Verbal cues   Assistive Device  Rolling walker;Gait belt   Gait Distance 100 feet   Gait Pattern Analysis step through gait   Gait Analysis Deviations decreased cadence;decreased step length;decreased stride length;antalgic gait;standing too far/close   increased forward trunk flexion  Gait Analysis Impairments impaired balance;pain;decreased strength;decreased endurance/activity tolerance   Ambulation distance was limited by: impaired motor function;pain   Gait Training Comments Gait training performed with patient. Education and VC for improved RW management provided.    Handoff Documentation  Handoff Bed alarm;Patient instructed to call nursing for mobility;Discussed with nursing    PT- AM-PAC - Basic Mobility Screen- How much help from another person do you currently need.....  Turning from your back to your side while in a a flat bed without using rails? 3 - A Little - Requires a little help (supervision, minimal assistance). Can use assistive devices. Moving from lying on your back to sitting on the side of a flat bed without using bed rails? 3 - A Little - Requires a little help (supervision, minimal assistance). Can use assistive devices.   Moving to and from a bed to a chair (including a wheelchair)? 3 - A Little - Requires a little help (supervision, minimal assistance). Can use assistive devices.   Standing up from a chair using your arms(e.g., wheelchair or bedside chair)? 3 - A Little - Requires a little help (supervision, minimal assistance). Can use assistive devices.   To walk in a hospital room? 3 - A Little - Requires a little help (supervision, minimal assistance). Can use assistive devices.   Climbing 3-5 steps with a railing? 2 - A Lot - Requires a lot of help (maximum to moderate assistance). Can use assistive devices.   AMPAC Mobility Score 17   TARGET Highest Level of Mobility Mobility Level 5, Stand for 1 minute   ACTUAL Highest Level of Mobility Mobility Level 7, Walk 25+ feet    Therapeutic Functional Activity  Therapeutic Functional Activity Comments Educated for role of acute care PT, safety, mobility, sequencing, transfer training, gait training, RW management, differences in PT d/c locations, and POC.    Clinical Impression  Initial Assessment At time of evaluation, patient presents with limitations to mobility secondary to impairments. He was able to perform all tasks with assist x1, however his tolerance to mobility  is limited overall by pain. Additionally, patient with increased WOB and SOB while on RA, desating to  86% with gait. Patient improved quickly when placed back on 1L and cued for PLB. Patient verbalized that with the stairs in his home, in his current physical state he is unable to manage his mobility and complete the tasks he needs to do safely. Recommend d/c to inpatient rehab.   Criteria for Skilled Therapeutic Interventions Met yes;treatment indicated   Functional Limitations in Following Categories (Describe Specific Limitations) mobility;self-care   Rehab Potential good, to achieve stated therapy goals    Patient/Family Stated Goals  Patient/Family Stated Goal(s) return to prior level of functioning;decrease pain    Frequency/Equipment Recommendations  PT Frequency 4x per week   Thurs  What day of week is next treatment expected? Friday   Equipment Needs During Admission/Treatment Rolling walker;Gait belt    PT Recommendations for Inpatient Admission  Activity/Level of Assist ambulate;assist of 1;with rolling walker;gait belt;in hall    PT Discharge Summary  Physical Therapy Disposition Recommendation Inpatient Rehab   Equipment Recommendations for Discharge To be determined pending progress     Problem: Physical Therapy GoalsGoal: Physical Therapy GoalsDescription: PT GOALS1. Patient will perform bed mobility independently2. Patient will perform transfers with least assistive device with modified independence3. Patient will ambulate a minimum of 250 feet with least assistive device with modified independence4. Patient will ascend and descend at least 4 steps with modified independenceOutcome: Initial problem identification Minerva Ends, PT, DPT

## 2022-06-20 NOTE — Significant Event
Looks like aspiration on cxr. Will give ceftriaxone, obtain sputum and legionella

## 2022-06-20 NOTE — Other
[facility]	 Orthopedic Consult Note Patient Data:  Patient Name: Raymond Gardner Age: 82 y.o. DOB: 28-Feb-1940	 MRN: [id number]	 Consult Information: Consultation requested by: Gwen Pounds, MDReason for consultation:  Low back painSource of Information: Patient, Doctor, and EMR/Previous RecordPresentation History: HPI:  Pleasant 82 year old male with multiple recent admission for same low back pain is seen and evaluated.  Past medical history significant for COPD, HTN, HLD, CAD with quadruple bypass, prostate cancer.  Multiple imaging including MRI and [facility] of lumbar and hip have been negative for osseous a musculoskeletal injury.  Patient however complains of pain with axial loading including walking but no pain whatsoever at rest, laying down down or sitting up.  Denies any fever, chills, numbness, tingling, shortness for breath, chest pain.Of note, patient states initial fall happened when he was in [place] in April.Review of Allergies/Medical History/Medications: I have reviewed the patient's allergies, prior to admission meds, past medical/surgical, family and social hx. Inpatient Medications:Current Facility-Administered Medications Medication Dose Route Frequency Last Rate  acetaminophen  1,000 mg Oral Q8H    acetaminophen  650 mg Oral Q6H PRN    aspirin  81 mg Oral Daily    cefTRIAXone  1 g IV Push Q24H     enoxaparin prophylaxis dosing  40 mg Subcutaneous Daily    gabapentin  100 mg Oral Nightly    HYDROmorphone  1 mg Subcutaneous Q4H PRN    ipratropium-albuteroL  3 mL Nebulization Q4H PRN    [Held by provider] losartan  50 mg Oral Daily    magnesium oxide  400 mg Oral Daily    morphine (ADULT)  2 mg Subcutaneous Q4H PRN    ondansetron  4 mg Oral Q6H PRN    Or  ondansetron (ZOFRAN) IV Push  4 mg IV Push Q6H PRN    pantoprazole  40 mg Oral Daily    predniSONE  40 mg Oral Daily    rosuvastatin 40 mg Oral Daily    sodium chloride  3 mL IV Push Q8H    sodium chloride  3 mL IV Push PRN for Line Care    sodium chloride  75 mL/hr Intravenous Continuous 75 mL/hr (06/20/22 0867)  tiotropium-olodateroL  2 Inhalation  Inhalation Daily   Review of Systems: Review of Systems Constitutional: Negative for chills and fever. HENT: Negative for ear discharge, hearing loss and tinnitus.  Eyes: Negative for blurred vision, double vision and photophobia. Respiratory: Negative for cough, shortness of breath and wheezing.  Cardiovascular: Negative for chest pain and palpitations. Gastrointestinal: Negative for abdominal pain, heartburn, nausea and vomiting. Genitourinary: Negative for flank pain. Musculoskeletal:  Positive for falls, low back pain and myalgias. Negative neck pain. Neurological: Negative for dizziness, tingling, sensory change and headaches. Psychiatric/Behavioral: Negative for memory loss.  Physical Exam: Vitals:Vitals:  06/20/22 0535 BP: (!) 142/84 Pulse: (!) 59 Resp: 18 Temp: 97.5 ?F (36.4 ?C) Physical ExamVitals reviewed. Constitutional:     General: He is not in acute distress.   Appearance: He is not ill-appearing or toxic-appearing. HENT:    Head: Normocephalic and atraumatic. Pulmonary:    Effort: Pulmonary effort is normal. Musculoskeletal:    Cervical back: Normal range of motion. No rigidity or tenderness.    Lumbar back: No lacerations or bony tenderness. Normal range of motion. Negative right straight leg raise test and negative left straight leg raise test.    Right hip: Normal.    Left hip: Normal.    Right upper leg: Normal.    Left upper leg:  Normal.    Right knee: Normal.    Left knee: Normal.    Comments: Negative FABER test.  Able to straight leg raise bilateral lower extremities. Skin:   General: Skin is warm.    Capillary Refill: Capillary refill takes less than 2 seconds. Findings: No erythema, lesion or rash. Neurological:    General: No focal deficit present.    Sensory: No sensory deficit.    Motor: No weakness. Psychiatric:       Mood and Affect: Mood normal.       Thought Content: Thought content normal. Review of Labs/Diagnostics: Lab Review:Recent Results (from the past 24 hour(s)) EKG  Collection Time: 06/19/22 12:04 PM Result Value Ref Range  Heart Rate 81 bpm  QRS Interval 104 ms  QT Interval 388 ms  QTC Interval 451 ms  P Axis 56 deg  QRS Axis 128 deg  T Wave Axis 59 deg  P-R Interval 160 msec  SEVERITY Otherwise Normal ECG severity PT/INR  Collection Time: 06/19/22  2:35 PM Result Value Ref Range  Prothrombin Time 10.9 9.5 - 12.1 seconds  INR 1.08 (H) 0.93 - 1.07 Mag  Collection Time: 06/19/22  2:35 PM Result Value Ref Range  Magnesium 2.0 1.8 - 2.5 mg/dL CBC auto differential  Collection Time: 06/19/22  2:35 PM Result Value Ref Range  WBC 10.3 4.0 - 11.0 x1000/?L  RBC 4.53 4.00 - 6.00 M/?L  Hemoglobin 15.5 13.2 - 17.1 g/dL  Hematocrit 53.66 44.03 - 50.00 %  MCV 102.9 (H) 80.0 - 100.0 fL  MCH 34.2 (H) 27.0 - 33.0 pg  MCHC 33.3 31.0 - 36.0 g/dL  RDW-CV 47.4 25.9 - 56.3 %  Platelets 150 150 - 420 x1000/?L  MPV 11.1 8.0 - 12.0 fL  Neutrophils 76.4 (H) 39.0 - 72.0 %  Lymphocytes 12.7 (L) 17.0 - 50.0 %  Monocytes 9.3 4.0 - 12.0 %  Eosinophils 0.6 0.0 - 5.0 %  Basophil 0.6 0.0 - 1.4 %  Immature Granulocytes 0.4 0.0 - 1.0 %  nRBC 0.0 0.0 - 1.0 %  ANC(Abs Neutrophil Count) 7.85 (H) 2.00 - 7.60 x 1000/?L  Absolute Lymphocyte Count 1.30 0.60 - 3.70 x 1000/?L  Monocyte Absolute Count 0.95 0.00 - 1.00 x 1000/?L  Eosinophil Absolute Count 0.06 0.00 - 1.00 x 1000/?L  Basophil Absolute Count 0.06 0.00 - 1.00 x 1000/?L  Absolute Immature Granulocyte Count 0.04 0.00 - 0.30 x 1000/?L  Absolute nRBC 0.00 0.00 - 1.00 x 1000/?L Comprehensive metabolic panel Collection Time: 06/19/22  2:35 PM Result Value Ref Range  Sodium 139 136 - 145 mmol/L  Potassium 4.3 3.5 - 5.1 mmol/L  Chloride 104 98 - 107 mmol/L  CO2 26 21 - 32 mmol/L  Anion Gap 9 5 - 16  Glucose 92 70 - 100 mg/dL  BUN 25 (H) 7 - 18 mg/dL  Creatinine 8.75 (H) 6.43 - 1.30 mg/dL  Calcium 8.2 (L) 8.5 - 10.5 mg/dL  BUN/Creatinine Ratio 32.9 8.0 - 23.0  Total Protein 5.9 (L) 6.0 - 8.0 g/dL  Albumin 2.8 (L) 3.4 - 5.0 g/dL  Total Bilirubin 0.4 0.2 - 1.2 mg/dL  Alkaline Phosphatase 54 46 - 116 U/L  Alanine Aminotransferase (ALT) 20 14 - 63 U/L  Aspartate Aminotransferase (AST) 18 10 - 42 U/L  Globulin 3.1 2.3 - 3.5 g/dL  A/G Ratio 0.9 (L) 1.0 - 2.2  AST/ALT Ratio 0.9 Reference Range Not Established  eGFR (Creatinine) 42 (L) >=60 mL/min/1.2m2 UA reflex to culture  Collection Time: 06/19/22  4:51  PM  Specimen: Clean Catch (Voided); Urine Result Value Ref Range  Reflex Urine Culture See Comment  Urinalysis with culture reflex     (BH LMW YH)  Collection Time: 06/19/22  4:51 PM  Specimen: Clean Catch (Voided); Urine Result Value Ref Range  Clarity, UA Clear Clear  Color, UA Yellow Yellow, Colorless  Specific Gravity, UA 1.025 1.005 - 1.030  pH, UA 6.0 5.5 - 7.5  Protein, UA Trace Negative-Trace  Glucose, UA Negative Negative  Ketones, UA 2+ (A) Negative  Blood, UA Negative Negative  Bilirubin, UA Negative Negative  Leukocytes, UA Negative Negative  Nitrite, UA Negative Negative  Urobilinogen, UA <2.0 <=2.0 mg/dL Magnesium  Collection Time: 06/20/22  6:15 AM Result Value Ref Range  Magnesium 1.9 1.8 - 2.5 mg/dL CBC auto differential  Collection Time: 06/20/22  6:15 AM Result Value Ref Range  WBC 5.9 4.0 - 11.0 x1000/?L  RBC 4.32 4.00 - 6.00 M/?L  Hemoglobin 14.8 13.2 - 17.1 g/dL  Hematocrit 16.10 96.04 - 50.00 %  MCV 101.4 (H) 80.0 - 100.0 fL  MCH 34.3 (H) 27.0 - 33.0 pg  MCHC 33.8 31.0 - 36.0 g/dL  RDW-CV 54.0 98.1 - 19.1 %  Platelets 143 (L) 150 - 420 x1000/?L  MPV 11.7 8.0 - 12.0 fL  Neutrophils 83.6 (H) 39.0 - 72.0 %  Lymphocytes 13.4 (L) 17.0 - 50.0 %  Monocytes 2.7 (L) 4.0 - 12.0 %  Eosinophils 0.0 0.0 - 5.0 %  Basophil 0.0 0.0 - 1.4 %  Immature Granulocytes 0.3 0.0 - 1.0 %  nRBC 0.0 0.0 - 1.0 %  ANC(Abs Neutrophil Count) 4.93 2.00 - 7.60 x 1000/?L  Absolute Lymphocyte Count 0.79 0.60 - 3.70 x 1000/?L  Monocyte Absolute Count 0.16 0.00 - 1.00 x 1000/?L  Eosinophil Absolute Count 0.00 0.00 - 1.00 x 1000/?L  Basophil Absolute Count 0.00 0.00 - 1.00 x 1000/?L  Absolute Immature Granulocyte Count 0.02 0.00 - 0.30 x 1000/?L  Absolute nRBC 0.00 0.00 - 1.00 x 1000/?L Comprehensive metabolic panel  Collection Time: 06/20/22  6:15 AM Result Value Ref Range  Sodium 134 (L) 136 - 145 mmol/L  Potassium 4.7 3.5 - 5.1 mmol/L  Chloride 102 98 - 107 mmol/L  CO2 23 21 - 32 mmol/L  Anion Gap 9 5 - 16  Glucose 150 (H) 70 - 100 mg/dL  BUN 27 (H) 7 - 18 mg/dL  Creatinine 4.78 (H) 2.95 - 1.30 mg/dL  Calcium 8.0 (L) 8.5 - 10.5 mg/dL  BUN/Creatinine Ratio 62.1 8.0 - 23.0  Total Protein 5.8 (L) 6.0 - 8.0 g/dL  Albumin 2.7 (L) 3.4 - 5.0 g/dL  Total Bilirubin 0.5 0.2 - 1.2 mg/dL  Alkaline Phosphatase 55 46 - 116 U/L  Alanine Aminotransferase (ALT) 22 14 - 63 U/L  Aspartate Aminotransferase (AST) 20 10 - 42 U/L  Globulin 3.1 2.3 - 3.5 g/dL  A/G Ratio 0.9 (L) 1.0 - 2.2  AST/ALT Ratio 0.9 Reference Range Not Established  eGFR (Creatinine) 52 (L) >=60 mL/min/1.75m2 Diagnostic Review:XR Chest PA or APResult Date: 5/8/2024XR CHEST PA OR AP Date: 06/19/2022 8:35 PM INDICATION: Hypoxia COMPARISON: CTA CHEST (PE) W IV CONTRAST 2022-02-07 FINDINGS: Lungs and Pleura: Loops of bowel in a hiatal hernia filled with air project over the right lower lobe. No pleural effusion or pneumothorax. Left basilar consolidation. Heart and Mediastinum:  Cardiomediastinal contours are unchanged.  Left basilar consolidation which may represent aspiration. Loops of bowel within the large hiatal hernia are dilated and air-filled and project over the right  lung base. [facility] Radiology Communication Center: Routine. Reported and signed by: Leilani Merl, MD  [facility] Radiology and Biomedical Imaging [place] Hip Right wo IV ContrastResult Date: 5/8/2024CT LUMBAR SPINE WO IV CONTRAST, [place] HIP RIGHT WO IV CONTRAST HISTORY: increased pain unable to walk COMPARISON: [place] HIP RIGHT WO IV CONTRAST 2022-06-19 18:28:23.000; CTA CHEST (PE) W IV CONTRAST 2022-02-07 (accession [id number]), NONE (accession [id number]) TECHNIQUE: [place] images of the right hip and lumbar spine were obtained without contrast. FINDINGS: BOWEL: Scattered colonic diverticulosis without evidence of acute diverticulitis. 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 PERITONEUM: Unremarkable. LYMPH NODES: Unremarkable. VESSELS: Limited noncontrast evaluation of the vessels demonstrates severe atherosclerotic calcifications. 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 BONES & SOFT TISSUE: Superior compression deformity of L5 and anterior compression deformity of T12, favored to be chronic. Additional multilevel degenerative changes of the lumbar spine with grade 1 anterolisthesis of L4 on L5. Mild retrolisthesis of L5 on S1. No acute traumatic listhesis. 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 There is also at least moderate bilateral neural foraminal narrowing at L4-L5 and L5-S1. There is at least moderate spinal stenosis at L3-L4. Left fat-containing direct inguinal hernia. No acute osseous abnormality of the bony pelvis or lumbar spine. 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 [place] Radiology Notify System Classification: Routine. (accession [id number]), Routine. (accession [id number]) Report initiated by:  Lesia Hausen, MD Reported and signed by: Vivianne Spence, MD  [facility] Radiology and Biomedical Imaging [place] Lumbar Spine wo IV ContrastResult Date: 5/8/2024CT LUMBAR SPINE WO IV CONTRAST, [place] HIP RIGHT WO IV CONTRAST HISTORY: increased pain unable to walk COMPARISON: [place] HIP RIGHT WO IV CONTRAST 2022-06-19 18:28:23.000; CTA CHEST (PE) W IV CONTRAST 2022-02-07 (accession [id number]), NONE (accession [id number]) TECHNIQUE:  images of the right hip and lumbar spine were obtained without contrast. FINDINGS: BOWEL: Scattered colonic diverticulosis without evidence of acute diverticulitis. 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 PERITONEUM: Unremarkable. LYMPH NODES: Unremarkable. VESSELS: Limited noncontrast evaluation of the vessels demonstrates severe atherosclerotic calcifications. 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 BONES & SOFT TISSUE: Superior compression deformity of L5 and anterior compression deformity of T12, favored to be chronic. Additional multilevel degenerative changes of the lumbar spine with grade 1 anterolisthesis of L4 on L5. Mild retrolisthesis of L5 on S1. No acute traumatic listhesis. 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 There is also at least moderate bilateral neural foraminal narrowing at L4-L5 and L5-S1. There is at least moderate spinal stenosis at L3-L4. Left fat-containing direct inguinal hernia. No acute osseous abnormality of the bony pelvis or lumbar spine. 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  Radiology Notify System Classification: Routine. (accession [id number]), Routine. (accession [id number]) Report initiated by:  Lesia Hausen, MD Reported and signed by: Vivianne Spence, MD  [facility] Radiology and Biomedical Imaging Echo 2D Complete w Doppler and CFI if Ind Image Enhancement 3D and or bubblesResult Date: 4/30/2024This result has an attachment that is not available.    ECHOCARDIOGRAM REPORT   Patient Name:   Gerlad Pelzel  Montez Hageman. Date of Exam: 06/11/2022 Medical Rec #:  [id number]          Height:       71.0 in Accession #:    [id number]         Weight:       171.0 lb Date of Birth:  [birth year]          BSA:          1.973 m? Patient Age:    82 years           BP:           149/75 mmHg Patient Gender: M                  HR:           59 bpm. Exam Location:  Inpatient Procedure: 2D Echo, Cardiac Doppler and Color Doppler Indications:    syncope History:        Patient has no prior history of Echocardiogram examinations.                 COPD; Risk Factors:Hypertension and Dyslipidemia. Sonographer:    Mike Gip Referring Phys: [id number] TOCHUKWU AGBATA IMPRESSIONS  1. Left ventricular ejection fraction, by estimation, is 55 to 60%. The left ventricle has normal function. The left ventricle has no regional wall motion abnormalities. Left ventricular diastolic function could not be evaluated.  2. Right ventricular systolic function is normal. The right ventricular size is normal. There is mildly elevated pulmonary artery systolic pressure. The estimated right ventricular systolic pressure is 38.8 mmHg.  3. The mitral valve is degenerative. No evidence of mitral valve regurgitation. No evidence of mitral stenosis. Moderate mitral annular calcification.  4. Tricuspid valve regurgitation is moderate.  5. 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 The aortic valve is calcified. Aortic valve regurgitation is mild. Aortic valve area, by VTI measures 0.79 cm?Marland Kitchen Aortic valve  mean gradient measures 16.0 mmHg. FINDINGS  Left Ventricle: Left ventricular ejection fraction, by estimation, is 55 to 60%. The left ventricle has normal function. The left ventricle has no regional wall motion abnormalities. The left ventricular internal cavity size was normal in size. There is  no left ventricular hypertrophy. Left ventricular diastolic function could not be evaluated due to mitral annular calcification (moderate or greater). Left ventricular diastolic function could not be evaluated. Right Ventricle: The right ventricular size is normal. No increase in right ventricular wall thickness. Right ventricular systolic function is normal. There is mildly elevated pulmonary artery systolic pressure. The tricuspid regurgitant velocity is 2.99  m/s, and with an assumed right atrial pressure of 3 mmHg, the estimated right ventricular systolic pressure is 38.8 mmHg. Left Atrium: Left atrial size was normal in size. Right Atrium: Right atrial size was normal in size. Pericardium: There is no evidence of pericardial effusion. Mitral Valve: The mitral valve is degenerative in appearance. Moderate mitral annular calcification. No evidence of mitral valve regurgitation. No evidence of mitral valve stenosis. Tricuspid Valve: The tricuspid valve is normal in structure. Tricuspid valve regurgitation is moderate . No evidence of tricuspid stenosis. Aortic Valve: 2D AVA 1.88 cm2. 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 The aortic valve is calcified. Aortic valve regurgitation is mild. Aortic regurgitation PHT measures 400 msec. Aortic valve mean gradient measures 16.0 mmHg. Aortic valve peak gradient measures 24.3 mmHg. Aortic valve  area, by VTI measures 0.79 cm?. Pulmonic Valve: The pulmonic valve was not well visualized. Pulmonic valve regurgitation is mild. No evidence of pulmonic stenosis. Aorta: The aortic root and ascending aorta are structurally normal, with no evidence of dilitation. IAS/Shunts: No atrial level shunt detected by color flow Doppler. LEFT VENTRICLE PLAX 2D LVIDd:         4.10 cm     Diastology LVIDs:         2.90 cm     LV e' medial:    5.55 cm/s LV PW:         1.00 cm     LV E/e' medial:  14.1 LV IVS:        1.00 cm     LV e' lateral:   8.27 cm/s LVOT diam:     1.80 cm     LV E/e' lateral: 9.5 LV SV:         49 LV SV Index:   25 LVOT Area:     2.54 cm? LV Volumes (MOD) LV vol d, MOD A2C: 90.0 ml LV vol d, MOD A4C: 82.1 ml LV vol s, MOD A2C: 39.1 ml LV vol s, MOD A4C: 31.3 ml LV SV MOD A2C:     50.9 ml LV SV MOD A4C:     82.1 ml LV SV MOD BP:      51.2 ml RIGHT VENTRICLE             IVC RV Basal diam:  3.40 cm     IVC diam: 1.10 cm RV S prime:     11.90 cm/s TAPSE (M-mode): 1.8 cm LEFT ATRIUM             Index        RIGHT ATRIUM           Index LA diam:        2.80 cm 1.42 cm/m?   RA Area:     13.80 cm? LA Vol (A2C):   40.1 ml 20.33 ml/m?  RA Volume:   32.20 ml  16.32 ml/m? LA Vol (A4C):   57.6 ml 29.20 ml/m? LA Biplane Vol: 51.9 ml 26.31 ml/m?  AORTIC VALVE                     PULMONIC VALVE AV Area (Vmax):    0.83 cm?      PR End Diast Vel: 3.76 msec AV Area (Vmean):   0.76 cm? AV Area (VTI):     0.79 cm? AV Vmax:           246.33 cm/s AV Vmean:          177.667 cm/s AV VTI:            0.616 m AV Peak Grad:      24.3 mmHg AV Mean Grad:      16.0 mmHg LVOT Vmax:         80.20 cm/s LVOT Vmean:        53.100 cm/s LVOT VTI:          0.192 m LVOT/AV VTI ratio: 0.31 AI PHT:            400 msec AORTA Ao Root diam: 3.50 cm Ao Asc diam:  3.30 cm MITRAL VALVE               TRICUSPID VALVE MV Area (PHT): 3.39 cm?    TR Peak grad:   35.8 mmHgMV Decel Time: 224 msec  TR Vmax:        299.00 cm/s MV E velocity: 78.40 cm/s MV A velocity: 58.30 cm/s  SHUNTS MV E/A ratio:  1.34        Systemic VTI:  0.19 m                            Systemic Diam: 1.80 cm Riley Lam MD Electronically signed by Riley Lam MD Signature Date/Time: 06/11/2022/1:28:01 PM   Final  MRI Hip Right without IV ContrastResult Date: 4/29/2024CLINICAL DATA:  Hip trauma, fracture suspected, xray done EXAM: MR OF THE RIGHT HIP WITHOUT CONTRAST TECHNIQUE: Multiplanar, multisequence MR imaging was performed. No intravenous contrast was administered. COMPARISON:  [place] 06/07/2022 FINDINGS: Bones: No acute fracture. No dislocation. No femoral head avascular necrosis. Bony pelvis intact without diastasis. SI joints and pubic symphysis within normal limits. No bone marrow edema. No marrow replacing bone lesion. Articular cartilage and labrum Articular cartilage: Areas of moderate chondral thinning and surface irregularity, most pronounced at the anterosuperior aspect of the hip joint. No subchondral marrow signal changes. Labrum: Degenerated, not well assessed in the absence of intra-articular contrast. No paralabral cyst. Joint or bursal effusion Joint effusion:  None. Bursae: No abnormal bursal fluid collection. Muscles and tendons Muscles and tendons: Mild tendinosis of the bilateral gluteus medius and minimus tendons. The hamstring, iliopsoas, rectus femoris, and adductor tendons appear intact without tear or significant tendinosis. Normal muscle bulk without atrophy or fatty infiltration. Other findings Miscellaneous: Mild generalized subcutaneous edema, nonspecific. No fluid collection. No inguinal lymphadenopathy. Trabeculated urinary bladder wall. Colonic diverticulosis. IMPRESSION: 1. No acute osseous abnormality of the right hip. 2. Moderate osteoarthritis of the right hip. 3. Mild tendinosis of the bilateral gluteus medius and minimus tendons. Electronically Signed   By: Duanne Guess D.O.   On: 06/10/2022 18:49MRI Lumbar Spine without IV ContrastResult Date: 4/29/2024CLINICAL DATA:  Low back pain, increased fracture risk EXAM: MRI LUMBAR SPINE WITHOUT CONTRAST TECHNIQUE: Multiplanar, multisequence MR imaging of the lumbar spine was performed. No intravenous contrast was administered. COMPARISON:  None Available. FINDINGS: Segmentation:  Standard. Alignment: Grade 1 anterolisthesis of L4 on L5. Slight retrolisthesis at L5-S1. Mild lumbar levocurvature. Vertebrae: No fracture, evidence of discitis, or bone lesion. Discogenic endplate marrow changes are most pronounced at L2-3 on the right and L5-S1 on the left. Chronic superior endplate Schmorl's node at L5 without associated marrow edema. Conus medullaris and cauda equina: Conus extends to the L1 level. Conus and cauda equina appear normal. Paraspinal and other soft tissues: Urinary bladder is moderately distended with trabeculated wall. No acute abnormality. Disc levels: T12-L1: No significant disc protrusion, foraminal stenosis, or canal stenosis. L1-L2: Minimal disc bulge and mild bilateral facet arthropathy. Borderline-mild canal stenosis. No significant foraminal stenosis. L2-L3: Disc bulge and endplate spurring, eccentric to the right. Moderate bilateral facet arthropathy with ligamentum flavum buckling. Moderate canal stenosis with right-sided subarticular recess stenosis. Mild right foraminal stenosis. L3-L4: Mild annular disc bulge with advanced bilateral facet arthropathy and ligamentum flavum buckling. Moderate canal stenosis and mild bilateral foraminal stenosis. L4-L5: Disc uncovering with diffuse disc bulge. Severe bilateral facet arthropathy with ligamentum flavum buckling and thin synovial cyst at the midline. There is severe canal stenosis with bilateral subarticular recess stenosis. Moderate left foraminal stenosis. L5-S1: Disc bulge and endplate spurring with mild-to-moderate bilateral facet arthropathy. No canal stenosis or foraminal stenosis, however there is mass effect upon the exited bilateral L5 nerve roots by osteophytic endplate ridging. IMPRESSION:  1. 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 Moderate left foraminal stenosis at this level. 2. Moderate canal stenosis at L2-3 and L3-4. 3. Moderately distended urinary bladder with trabeculated wall, which may be secondary to chronic bladder outlet obstruction versus neurogenic bladder. Electronically Signed   By: Duanne Guess D.O.   On: 06/10/2022 18:40CT Head wo IV ContrastResult Date: 4/29/2024CLINICAL DATA:  Trauma, fall EXAM: [place] HEAD WITHOUT CONTRAST TECHNIQUE: Contiguous axial images were obtained from the base of the skull through the vertex without intravenous contrast. RADIATION DOSE REDUCTION: This exam was performed according to the departmental dose-optimization program which includes automated exposure control, adjustment of the mA and/or kV according to patient size and/or use of iterative reconstruction technique. COMPARISON:  None Available. FINDINGS: Brain: No acute intracranial findings are seen. There are no signs of bleeding within the cranium. There is no focal mass effect. Cortical sulci are prominent. There is decreased density in periventricular white matter. Scattered arterial calcifications are seen. Vascular: Scattered arterial calcifications are seen. Skull: No acute findings are seen. Sinuses/Orbits: There is mucosal thickening and possible small air-fluid level in right maxillary sinus. Other: None. IMPRESSION: No acute intracranial findings are seen. Atrophy. 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 Electronically Signed   By: Ernie Avena M.D.   On: 06/10/2022 15:11XR Chest AP Portable ([facility] LM WH)Result Date: 4/29/2024CLINICAL DATA:  Weakness EXAM: PORTABLE CHEST 1 VIEW COMPARISON:  04/10/2020 FINDINGS: Sternal wires. Normal cardiopericardial silhouette. No pneumothorax or effusion. There is a density along the right side of the mediastinum inferiorly consistent with known history of a hiatal hernia. No edema or consolidation. Overlapping cardiac leads. IMPRESSION: Postop chest.  Hiatal hernia Electronically Signed   By: Karen Kays M.D.   On: 06/10/2022 13:58VAS [place] LOWER EXTREMITY VENOUS (DVT) (7a-7p)Result Date: 4/27/2024This result has an attachment that is not available.  Lower Venous DVT Study Patient Name:  Robie Mcniel.  Date of Exam:   06/07/2022 Medical Rec #: [id number]           Accession #:    [id number] Date of Birth: 05-27-1940           Patient Gender: M Patient Age:   55 years Exam Location:  [facility] Procedure:      VAS [place] LOWER EXTREMITY VENOUS (DVT) Referring Phys: HALEY SAGE -------------------------------------------------------------------------------- Indications: Pain. Risk Factors: Trauma. Comparison Study: No prior studies. Performing Technologist: Chanda Busing RVT Examination Guidelines: A complete evaluation includes B-mode imaging, spectral Doppler, color Doppler, and power Doppler as needed of all accessible portions of each vessel. Bilateral testing is considered an integral part of a complete examination. Limited examinations for reoccurring indications may be performed as noted. The reflux portion of the exam is performed with the patient in reverse Trendelenburg. +---------+---------------+---------+-----------+----------+--------------+ -RIGHT    -Compressibility-Phasicity-Spontaneity-Properties-Thrombus Aging- +---------+---------------+---------+-----------+----------+--------------+ -CFV      -Full           -Yes      -Yes        -          -              - +---------+---------------+---------+-----------+----------+--------------+ -SFJ      -Full           -         -           -          -              - +---------+---------------+---------+-----------+----------+--------------+ -  FV Prox  -Full           -         -           -          -              - +---------+---------------+---------+-----------+----------+--------------+ -FV Mid   -Full           -         -           -          -              - +---------+---------------+---------+-----------+----------+--------------+ -FV Distal-Full           -         -           -          -              - +---------+---------------+---------+-----------+----------+--------------+ -PFV      -Full           -         -           -          -              - +---------+---------------+---------+-----------+----------+--------------+ -POP      -Full           -Yes      -Yes        -          -              - +---------+---------------+---------+-----------+----------+--------------+ -PTV      -Full           -         -           -          -              - +---------+---------------+---------+-----------+----------+--------------+ -PERO     -Full           -         -           -          -              - +---------+---------------+---------+-----------+----------+--------------+ +----+---------------+---------+-----------+----------+--------------+ -LEFT-Compressibility-Phasicity-Spontaneity-Properties-Thrombus Aging- +----+---------------+---------+-----------+----------+--------------+ -CFV -Full           -Yes      -Yes        -          -              - +----+---------------+---------+-----------+----------+--------------+ Summary: RIGHT: - There is no evidence of deep vein thrombosis in the lower extremity. - No cystic structure found in the popliteal fossa. LEFT: - No evidence of common femoral vein obstruction. *See table(s) above for measurements and observations. Electronically signed by Heath Lark on 06/08/2022 at 1:35:35 PM.   Final  [place] Hip Right wo IV ContrastResult Date: 4/26/2024CLINICAL DATA:  Proximal right leg pain after fall 05/29/2022 in [place]. Decreased range of motion. EXAM: [place] OF THE RIGHT HIP WITHOUT CONTRAST TECHNIQUE: Multidetector [place] imaging of the right hip was performed according to the standard protocol. [place]  image reconstructions were also generated. RADIATION DOSE REDUCTION: This exam was performed according to the departmental dose-optimization  program which includes automated exposure control, adjustment of the mA and/or kV according to patient size and/or use of iterative reconstruction technique. COMPARISON:  Pelvis and right hip radiographs 06/07/2022 FINDINGS: Bones/Joint/Cartilage Mildly decreased bone mineralization. Moderate superomedial right femoroacetabular joint space narrowing with mild superolateral acetabular degenerative osteophytosis.1 No acute fracture is seen. Ligaments Suboptimally assessed by [place]. Muscles and Tendons Normal size and density of the regional musculature. Soft tissues No right hip joint effusion. High-grade atherosclerotic calcifications. Mild-to-moderate right fat containing right inguinal hernia with apparent fluid at the distal aspect. IMPRESSION: 1. No acute fracture is seen. 2. Moderate right femoroacetabular osteoarthritis. 3. Mild-to-moderate right fat containing inguinal hernia. Electronically Signed   By: Neita Garnet M.D.   On: 06/07/2022 15:57DG Hip Unilat W or Wo Pelvis 2-3 Views RightResult Date: 4/26/2024CLINICAL DATA:  Fall, brought in by ambulance for right upper leg pain since fall on 04/17 in [place]. Decreased range of motion. Difficulty walking. EXAM: DG HIP (WITH OR WITHOUT PELVIS) 2-3V RIGHT COMPARISON:  None Available. FINDINGS: There is no evidence of hip fracture or dislocation. Superolateral hip joint space narrowing with marginal spurring. Prominent vascular calcifications. IMPRESSION: 1. No acute fracture or dislocation. MRI examination for further evaluation is recommended if there is high clinical suspicion for a hip fracture. 2. Mild right hip osteoarthritis. Electronically Signed   By: Larose Hires D.O.   On: 06/07/2022 15:11 Impression: No acute osseous, intramuscular hematoma, or musculoskeletal abnormalities on imaging.Principal Problem:  Spinal stenosis  SNOMED [place](R): SPINAL STENOSIS   Recommendations: - PT and OT- pain management:  Rest, ice, Tylenol, lidocaine patch, tramadol- recommend short-term rehab- outpatient follow-up with Orthopedics- remainder of care per primary teamThank you for consultSigned: Everlean Alstrom, PA For questions please page: [id number]/9/20247:46 AM

## 2022-06-20 NOTE — Plan of Care
Plan of Care Overview/ Patient Status    Assumed care of patient 2100-0700. Patient is alert and oriented, and VSS on 1L O2. Patient arrived to ED from home with complaint of back pain, and is admitted with Spinal Stenosis. Complaint of back pain only with movement; states no pain while stationary in seated or lying position. Medications administered as ordered. NS infusing at 75 mL/hr. Hourly rounding maintained. Patient appears to be in a position of comfort, bed in lowest and locked position, and call light within reach.

## 2022-06-20 NOTE — Progress Notes
Inpatient Occupational Therapy Progress NoteDefault Flowsheet Data (most recent)   IP Adult OT Eval/Treat - 06/20/22 0756    Date of Visit / Treatment  Date of Visit / Treatment 06/20/22 (P)     Treatment Deferred  Treatment Deferred Patient not yet ready for participation (P)    Deferred Treatment Comments Awaiting ortho consult. Plan to follow & reattempt as appropriate. (P)      Lilith Solana E Terecia Plaut, OTR/L.

## 2022-06-20 NOTE — Plan of Care
Inpatient Occupational Therapy EvaluationDefault Flowsheet Data (most recent)   IP Adult OT Eval/Treat - 06/20/22 1533    Date of Visit / Treatment  Date of Visit / Treatment 06/20/22   1/4 Th  Note Type Evaluation   Start Time 1501   End Time 1533   Total Treatment Time 32    General Information  Pertinent History Of Current Problem per chart, 82 y.o. male with asthma COPD not on home O2 hypertension hyperlipidemia coronary artery disease status post four-vessel CABG prostate cancer status post radiation and recent admission and discharge for severe spinal stenosis who comes to the ED complaining of worsening back pain.   Subjective Is this October?   General Observations chart reviewed, RN consulted. Pt pre/post in bed, +bed alarm, +O2 1L NC and all needs within reach.   Precautions/Limitations Fall Precautions;Bed alarm;Oxygen therapy device   Precautions/Limitations Comment some confusion at this time   Fall History yes, in the past month    Weight Bearing Status  Weight Bearing Status Comments WBAT    Prior Level of Functioning/Social History  Prior Level of Function independent with mobility;independent with ADLs   Patient resides with: Adult Child(ren)   son  Type of Home 2 story house   Home Setup Steps to enter with rail(s)   Equipment Utilized Prior to Admission/Treatment No device   Additional Comments has a RW at home    Vital Signs and Orthostatic Vital Signs     Pre Treatment SpO2 (%) 97      Pre Treatment O2 Delivery nasal cannula      Pre Treatment O2 Concentration 1L   During Treatment SpO2 (%) 84   During Treatment O2 Delivery room air      Post Treatment SpO2 (%) 97      Post Treatment O2 Delivery nasal cannula      Post Treatment O2 Concentration 1L    Pain/Comfort  Location #1 - PreTreatment Rating (Numbers Scale) 0/10 - no pain   Posttreatment Rating (Numbers Scale) 0/10 - no pain   Pain Location - Orientation lower   Pain Location back   Pain Comment (Pre/Post Treatment Pain) RN aware   Pain Rating at Rest 0/10 - no pain   Pain Rating with Activity 2/10    Patient Coping  Observed Emotional State accepting    Cognition  Overall Cognitive Status Impaired   Orientation Level Oriented to person;Oriented to situation;Disoriented to place;Disoriented to time   knows hospital not name; knows year not month  Level of Consciousness alert   Following Commands Follows one step commands without difficulty   Administrator, arts / Judgment --   pt demo correct use of RN call bell   Vision/ Hearing  Vision Assessment Results No vision deficits noted    Range of Motion  Range of Motion Examination bilateral upper extremity ROM was [facility]    Manual Muscle Testing  Manual Muscle Testing Comments B ue grossly 3+/5    Coordination  Finger to Nose WFL - Within Functional Limits   Opposition WFL - Within Functional Limits    Sensory Assessment  Sensory Assessment Comments intact to light touch    Skin Assessment  Skin Assessment See Nursing Documentation    Balance  Sitting Balance: Static  GOOD-    Maintains static position against minimal resistance with no Assistive Device   Sitting Balance: Dynamic  FAIR+    Performs dynamic activities through full range with Supervision   Standing Balance: Static FAIR  Maintains static position without assist or device, may require Supervision or Verbal Cues (<2 minutes)   Standing Balance: Dynamic  FAIR-     Performs dynamic activities through partial range (50-75%) with Contact Guard   Balance Assist Device Rolling walker    Lower Body Dressing  Independence/Assistance Level Supervision;Set-up required   Clothing Items hospital socks   Lower Body Dressing Comments sitting EOB    Toilet Training  Independence/Assistance Level Stand-by assist;Assist of 1   Hygiene Performance supervision Clothing Management supervision     AM-PAC - Daily Activity IP Short Form  Help needed from another person putting on/taking off regular lower body clothing 3 - A Little   Help needed from another person for bathing (incl. washing, rinsing, drying) 3 - A Little   Help needed from another person for toileting (incl. using toilet, bedpan, urinal) 3 - A Little   Help needed from another person putting on/taking off regular upper body clothing 4 - None   Help needed from another person taking care of personal grooming such as brushing teeth 4 - None   Help needed from another person eating meals 4 - None   AM-PAC Daily Activity Raw Score (Total of rows above) 21   CMS Score (based on Raw Score - with G Code) 21 - 32.79% impaired      (G Code - CJ)   Comments pt c/o of pain with LB tasks and standing   ACTUAL Highest Level of Mobility Mobility Level 5, Stand for 1 minute    Bed Mobility  Supine-to-Sit Independence/Assistance Level Stand-by assist;Assist of 1   Supine-to-Sit Assist Device Hand held assist   Sit-to-Supine Independence/Assistance Level Stand-by assist;Assist of 1   Sit-to-Supine Assist Device Bed rails    Sit-Stand Transfer Training  Sit-to-Stand Transfer Independence/Assistance Level Contact guard;Assist of 1;Verbal cues   Sit-to-Stand Transfer Assist Device Rolling walker   Stand-to-Sit Transfer Independence/Assistance Level Contact guard    Toilet Transfer Training  Symptoms Noted During/After Treatment Set designer) increased pain   Toilet Transfer Independence/Assistance Level Level Contact guard;Assist of 1;Verbal cues   Toilet Transfer Assist Device Rolling walker    Handoff Documentation  Handoff Patient in bed;Bed alarm;Discussed with nursing    Activity Tolerance  Activity Tolerance Comments decreased    Therapeutic Functional Activity  Therapeutic Functional Activity Comments BADLs, mobility, education Clinical Impression  Initial Assessment OT eval and treat completed. Pt presents alert with some confusion noted and decreased endurance and activity tolerance below his baseline. Pt demo good AROM B UE and observed OOB to toilet with CGA x 1 using a RW. pt c/o increased pain standing. pt would benefit from skilled OT to max LOF. Recommend IR when medically stable.   Criteria for Skilled Therapeutic Interventions Met yes   Impairments Found (describe specific impairments) aerobic capacity/endurance;BADL's;balance;coordination - gross motor;mobility;pain   Functional Limitations in Following Categories (Describe Specific Limitations) self-care;mobility   Rehab Potential good, to achieve stated therapy goals    Patient/Family Stated Goals  Patient/Family Stated Goal(s) decrease pain;get stronger;feel better    Frequency/Equipment Recommendations  OT Frequency 4x per week   What day of week is next treatment expected? Friday   Equipment Needs During Admission/Treatment Rolling walker    OT Recommendations for Inpatient Admission  Activity/Level of Assist out of bed;assist of 1;contact guard;with rolling walker   ADL Recommendations assist to bathroom;assist of 1;contact guard;with rolling walker   Other/Comments fall prevention education provided    Planned Treatment / Interventions  Plan for Next Visit increase endurance and participation with activities   General Treatment / Interventions Adaptive Equipment Evaluation / Training;Activities of Daily Living;Aerobic Capacity / Endurance Conditioning;Self Care / Home Management   Training Treatment / Interventions Balance / Gait Training;Cognitive Training;Endurance Training;Functional Warehouse manager / Interventions Patient Education / Training    OT Discharge Summary  Disposition Recommendation Inpatient Rehab   Equipment Recommendations for Discharge Rolling walker Problem: Occupational Therapy GoalsGoal: Occupational Therapy GoalsDescription: Pt will be alert and oriented x 4 no cuesPt will complete toilet/commode transfers with CTG using AE.Pt will complete LB bathing/ dressing with CTG  using AE.Pt will tolerate standing at sink to perform light self care activities with F+ standing balance & safety awareness.Outcome: Initial problem identification Fletcher Anon, MS, OTR/LPlan of Care Overview/ Patient Status

## 2022-06-20 NOTE — Progress Notes
Inpatient Physical Therapy Evaluation Attempt IP Adult PT Eval/Treat - 06/20/22 1601    Treatment Deferred  Treatment Deferred Patient not yet ready for participation   Deferred Treatment Comments Patient currently awaiting Ortho consult. PT to continue to follow and reattempt as able and appropriate.     Minerva Ends, PT, DPT

## 2022-06-20 NOTE — Utilization Review (ED)
Pt meets inpt criteria for [facility], Recent fall with spinal injury in [place]. Severe spinal stenosis.UM Status: Meets [facility]patient [facility] RNED Utilization Coordinator

## 2022-06-20 NOTE — Progress Notes
[facility] CampusHospitalist Progress NoteDonald [phone] Hospital Day: 2 Active IssuesSymptomatic severe spinal stenosis at L4Chronic: history of CAD/CABG 2010, asthma/COPD, HTN, HLD, prostate cancer status post radiation therapyInterval HistoryAVSS, 95% on 1 L Creatinine 1.63 with a baseline of approximately 1.2, labs otherwise reassuring [place] of the L-spine demonstrates multilevel degenerative changes with severe stenosis at L4/L5 (similar to MRI findings from April 29th)Chest x-ray demonstrates a left basilar consolidation which may represent aspirationProcalcitonin and urinary antigens pendingAdmitting UA negativePhysical ExamTemp:  [97.5 ?F (36.4 ?C)-98.2 ?F (36.8 ?C)] 97.5 ?F (36.4 ?C)Pulse:  [59-82] 59Resp:  [16-20] 18BP: (128-142)/(73-84) 142/84SpO2:  [92 %-95 %] 95 %Device (Oxygen Therapy): nasal cannulaO2 Flow (L/min):  [1] 1Gross Totals (Last 24 hours) at 06/20/2022 0735Last data filed at 06/20/2022 0642Intake 952.67 ml Output 700 ml Net 252.67 ml General: Well-developed, well-nourished, pleasant/NADHEENT: NCAT, EOMI, MMM, no oral lesionsNeck: Supple, no JVD, no thyromegalyHeart: RRR; no rubs, gallops or murmursLungs: Clear to auscultation bilaterally; fair inspiratory effortAbdomen: Soft, nontender, nondistended, normoactive bowel soundsExtremities: Warm and well-perfused, no CCESkin: Normal texture, turgor and colorNeuro: Alert and oriented ?3; cranial nerves II through XII grossly intact; strength and sensation grossly intact; active SLRs bilaterally without pain or apparent weaknessPsych: Mood and affect appropriate and unremarkableAssessment/Plan55 year old male discharged 5 days ago from a community hospital in [facility] after treatment for severe stenosis at L4 presents with worsening back pain and inability to ambulate; history of CAD/CABG 2010, asthma/COPD, HTN, HLD, prostate cancer status post radiation therapy:Severe spinal stenosis at L4: Imaging appears unchanged No red flag symptomsOrthopedics to see Currently on prednisone 40 mg daily Start scheduled Tylenol Hold on Toradol for now given AKI and steroid therapySubcu narcotic analgesia for severe pain only PT/OTConcern for aspiration pneumonitis versus pneumonia: Placed on Rocephin overnight Check procalcitonin, urinary antigens Unasyn an option depending on clinical course and resultsAKI: Holding PTA losartan Continue gentle IV hydrationOver 35 minutes spent on the care of this patient today; over 50% of that time was spent in counseling and coordination of care.Gwen Pounds MD5/9/2024Hospitalist Service [facility] 	Inpatient MedsCurrent Facility-Administered Medications Medication Dose Route Frequency Last Rate  acetaminophen  1,000 mg Oral Q8H    acetaminophen  650 mg Oral Q6H PRN    aspirin  81 mg Oral Daily    cefTRIAXone  1 g IV Push Q24H     enoxaparin prophylaxis dosing  40 mg Subcutaneous Daily    gabapentin  100 mg Oral Nightly    HYDROmorphone  1 mg Subcutaneous Q4H PRN    ipratropium-albuteroL  3 mL Nebulization Q4H PRN    ketorolac  15 mg IV Push Q6H PRN    [Held by provider] losartan  50 mg Oral Daily    magnesium oxide  400 mg Oral Daily    morphine (ADULT)  2 mg Subcutaneous Q4H PRN    ondansetron  4 mg Oral Q6H PRN    Or  ondansetron (ZOFRAN) IV Push  4 mg IV Push Q6H PRN    pantoprazole  40 mg Oral Daily    predniSONE  40 mg Oral Daily    rosuvastatin  40 mg Oral Daily    sodium chloride  3 mL IV Push Q8H    sodium chloride  3 mL IV Push PRN for Line Care    sodium chloride  75 mL/hr Intravenous Continuous 75 mL/hr (06/20/22 0642)  tiotropium-olodateroL  2 Inhalation  Inhalation Daily   Reviewed DataRecent Labs Lab 05/08/241435 05/09/240615 WBC 10.3 5.9 HGB 15.5 14.8 HCT 46.60 43.80 PLT  150 143* MCV 102.9* 101.4* INR 1.08*  --  Recent Labs   05/08/241435 NA 139 K 4.3 CL 104 CO2 26 BUN 25* CREATININE 1.63* ANIONGAP 9 CALCIUM 8.2* MG 2.0 AST 18 ALT 20 ALKPHOS 54 ALBUMIN 2.8* GLOB 3.1 No results for input(s): POCLAC, LACTATE in the last 168 hours.No results for input(s): TROPONINI in the last 168 hours.@Lab  Results Component Value Date  CHOL 176 02/15/2021  HDL 77 02/15/2021  TRIG 71 02/15/2021 Recent Labs Lab 05/08/241651 SPECGRAV 1.025 PHUR 6.0 LEUKOCYTESUR Negative NITRITE Negative PROTEINUA Trace GLUCOSEU Negative KETONESU 2+* BILIRUBINUR Negative UROBILINOGEN <2.0 No results for input(s): LABBLOO in the last 168 hours.No results for input(s): LABURIN in the last 168 hours.No results for input(s): LOWERRESPIRA in the last 168 hours.XR Chest PA or APResult Date: 06/19/2022 Left basilar consolidation which may represent aspiration. 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 [facility] Radiology Communication Center: Routine. Reported and signed by: Leilani Merl, MD  [facility] Radiology and Biomedical Imaging [place] Hip Right wo IV ContrastResult Date: 5/8/2024No acute osseous abnormality of the bony pelvis or lumbar spine. 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 [place] [facility] Notify System Classification: Routine. (accession [id number]), Routine. (accession [id number]) Report initiated by:  Lesia Hausen, MD Reported and signed by: Vivianne Spence, MD  [facility] Radiology and Biomedical Imaging [place] Lumbar Spine wo IV ContrastResult Date: 5/8/2024No acute osseous abnormality of the bony pelvis or lumbar spine. 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 [place] [facility] Notify System Classification: Routine. (accession [id number]), Routine. (accession [id number]) Report initiated by:  Lesia Hausen, MD Reported and signed by: Vivianne Spence, MD  [facility] Radiology and Biomedical Imaging  XR Chest PA or APResult Date: 06/19/2022 Left basilar consolidation which may represent aspiration. 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 [facility] Radiology Communication Center: Routine. Reported and signed by: Leilani Merl, MD  [facility] Radiology and Biomedical Imaging [place] Hip Right wo IV ContrastResult Date: 5/8/2024No acute osseous abnormality of the bony pelvis or lumbar spine. 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 [place] [facility] Notify System Classification: Routine. (accession [id number]), Routine. (accession [id number]) Report initiated by:  Lesia Hausen, MD Reported and signed by: Vivianne Spence, MD  [facility] Radiology and Biomedical Imaging [place] Lumbar Spine wo IV ContrastResult Date: 5/8/2024No acute osseous abnormality of the bony pelvis or lumbar spine. 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 [place] [facility] Notify System Classification: Routine. (accession [id number]), Routine. (accession [id number]) Report initiated by:  Lesia Hausen, MD Reported and signed by: Vivianne Spence, MD  [facility] Radiology and Biomedical Imaging  Results for orders placed or performed during the hospital encounter of 06/19/22 EKG Result Value Ref Range  Heart Rate 81 bpm  QRS Interval 104 ms  QT Interval 388 ms  QTC Interval 451 ms  P Axis 56 deg  QRS Axis 128 deg  T Wave Axis 59 deg  P-R Interval 160 msec  SEVERITY Otherwise Normal ECG severity Results for orders placed or performed during the hospital encounter of 03/14/22 Echo 2D Complete w Doppler and CFI if Ind Image Enhancement 3D and or bubbles Result Value Ref Range  Reported Biplane EF% 58 %  Narrative  ~ *  Normal left ventricular size, wall thickness, systolic function and wall motion. LVEF calculated by biplane Simpson's was 58%.  Normal diastolic function and filling pressures.* Normal right ventricular cavity size and systolic function.* Moderate aortic valve calcification.  Mild aortic stenosis.  Peak aortic velocity is 2.5 m/s with a calculated peak aortic gradient of 26 mmHg.  Mean gradient is 13 mmHg.  Aortic valve area is 1.4 cm2.  Dimensionless index is 0.44.  Mild aortic regurgitation.* Normal mitral valve leaflets.  Mild posterior mitral annular calcification.  Trace mitral regurgitation.* Mild tricuspid regurgitation with normal right heart pressures.* No significant pericardial effusion.* No prior study available for comparison.

## 2022-06-20 NOTE — ED Notes
8:24 PM ED to Floor HandoffAdmission Dx:      Spinal Stenosis        Telemetry: 	[x]  Yes		[]  NoOxygen Tank on Stretcher >1000 PSI:  []  YesCode Status:   [x]  Full		[]  Partial		[]  No CodeIsolation: 	[x]  None	[]  Contact	[]  Droplet	[]  AirborneSafety Precautions: []  None	[]  Sitter   []  Restraints	[]  Suicidal	[]  Fall Risk	Other (specify):           Mentation/Orientation:    A&O (person, place, time and situation) x          	 		Disoriented to:          Deficits: []  Hearing impaired	[]  Blind  	[]  Nonverbal		 []  ID/DD (intelectual disability/developmental disability)Ambulation: []  Ambulatory	[]  Non-Ambulatory  Diet: [x]  OK to eat	[]  NPO	Other (specify):     cardiac      IV Access: [x]  Yes   []  No     Vital signs Charted in the last hour: [x] Yes    []  No         Other (specify)            Skin Alteration:[]  Pressure Injury	[]  Wound	[x]  None	[]  Skin Not AssessedDiarrhea/Loose stool : []  1x within 24h  []  2x within 24h  []  3x within 24h  [x]  None      C.Diff Order:  []  Ordered- needs to be collected    []  Collected-sent to lab    []  Resulted - Negative C.Diff  []  Resulted - Positive C.Diff    []  Not Ordered   	[]  N/APatient Belongings:Does the patient have belongings going to the floor?   [x] Yes    []  NoAre the belongings documented?          [x] Yes    []  NoIs someone taking belongings home?   [] Yes    [x]  No   Who (specify):           _________Noel Donatello Kleve, RNPlease call the ED RN if you have any questions            [facility] ED [facility] Emergency Department

## 2022-06-20 NOTE — Plan of Care
[facility] CampusSpiritual Care NoteAssessment:  Religion:NoneInformation Obtained From: Medical RecordTypes of Relational Support: UTAQuality of Relational Support: UTAObservation or Mood of Visit: Patient Asleep; UTAIssues Raised by Patient: Other (Comment) (UTA)Issues Raised by Family: Other (Comment) (UTA)Helpful Religious Practices: Other (Comment) (UTA)Spiritual Resources: Other (Comment) (UTA) Intervention:  Referral Source: Chaplain InitiatedResponding Chaplain: Chaplain Resident; Unit ChaplainConsulted With: Unit Secretary/BA/HUCKLanguage or Special Accommodation Rendered?: NoVisit and Intervention Type: Initial; Spiritual Visit Spiritual care interventions provided: Prayer, -- (UTA), Patient was Unavailable When Tried to Visit Outcome: With the help of the chaplain, patient/loved one(s):   Plan: Permission Given by the Patient/Representative to Notify their Outside [employer] or Faith Liason: NoFollow-Up Visit Needed: NoMichelle MotaChaplain ResidentDepartment of Spiritual Care and EducationBridgeport Hospital[address] [place], [place] [phone]- On Call: [phone] 06/20/2022 12:37 PMPlan of Care Overview/ Patient Status

## 2022-06-20 NOTE — Plan of Care
Problem: Adult Inpatient Plan of CareGoal: Readiness for Transition of CareOutcome: Initial problem identification Plan of Care Overview/ Patient Status    Case Management Screening  Flowsheet Row Most Recent Value Case Management Screening: Chart review completed. If YES to any question below then proceed to CM Eval/Plan  Is there a change in their cognitive function No Do you anticipate that the pt will have any discharge needs requiring CM intervention? Yes Has there been a readmission within the last 30 days No Were there services prior to admission ( Examples: Assisted Living, HD, Homecare, Extended Care Facility, Methadone, SNF, Outpatient Infusion Center) No Negative/Positive Screen Positive Screening: Complete CM Evaluation and Plan Case Manager Attestation  I have reviewed the medical record and completed the above screen. CM staff will follow patient's progress and discuss the plan of care with the Treatment Team. Yes  Case Management Evaluation  Flowsheet Row Most Recent Value Case Management Evaluation and Plan  Arrived from prior to admission home/apartment/condo Do you have a caregiver, or do you anticipate the need for a caregiver given the change in your physicial function? Yes  Lives With Child(ren), Adult Services Prior to Admission none Patient Requires Care Coordination Intervention Due To discharge planning needs/concerns, change in physical function Prior to Hospitalization: Assistance Needed/DME being used Ambulation Ambulation Assistance/DME: straight cane Documented Insurance Accurate Yes Any financial concerns related to anticipated discharge needs No Patient's home address verified Yes Patient's PCP of record verified Yes Last Date Seen by PCP 0-3 months Living Environment   Lives With Child(ren), Adult Current Living Arrangements home/apartment/condo Source of Clinical History  Patient's clinical history has been reviewed and source of Information is: Patient, Medical record Case Manager Attestation  I have reviewed the medical record and completed the above evaluation with the following recommendations. Yes Discharge Planning Coordination Recommendations  Discharge Planning Coordination Recommendations Needs not determined at this time Case Manager reviewed plan of care/ continuum of care need's with  Patient, Interdisciplinary Team  82 year old male discharged 5 days ago from a community hospital in [facility] after treatment for severe stenosis at L4 presents with worsening back pain and inability to ambulate; PMH - CAD/CABG 2010, asthma/COPD, HTN, HLD, prostate cancer status post radiation therapy: This patient has been identified as meeting the COPD IP pathway program's criteria and will be followed by the Care Management Department throughout the hospitalization.Has social determinates or any potential barriers to a successful D/C transition been identified during initial assessment? : No,     Does patient have a Pulmonologist?: No Is patient appropriate for the Meds to Beds program? ([facility] and [facility] ONLY): N/AMet with patient who states he has been staying with his son and DIL. Patient is independent in mobility and states he choosers not to drive.Back pain started after an episode in [place] where he passed out sitting in a chair and landed on the floor.Patient states he has not has any home or outpatient therapy.Patient states if rehab is recommended he would go back to [employer]. Assessment screening completed. Continue to follow patient's progress and discuss plan of care with treatment team.  Discharge needs not determined at this time. Care Management will continue to follow with team. Nicolette Bang RN [facility] ManagementMHB [phone]

## 2022-06-20 NOTE — H&P
[facility] Patient Name: Raymond Gardner of Birth: 02-17-1942MRN: [id number] SUBJECTIVE Chief Complaint: Low back painHistory of Present Illness: Raymond Gardner is a 82 y.o. male with asthma COPD not on home O2 hypertension hyperlipidemia coronary artery disease status post four-vessel CABG prostate cancer status post radiation and recent admission and discharge for severe spinal stenosis who comes to the ED complaining of worsening back pain since discharge and so bad he can not walk.  No bowel or bladder incontinence or decreased sensation.  No chest pain and no shortness a breath.  Of note unless admission at other hospital Orthopedics and spine surgery were consulted and the plan was for a procedure but the patient improved symptomatically steroids and was able to walk and thus was discharged.  He was discharged on a steroid taper patient is unclear how much he is taking since his family members are the ones giving him his medication.  Earlier today he went to orthopedic walk-in and they did an x-ray and told him to come to the ED.In the ED vital signs stable but then later in the evening he became mildly hypoxic and put on nasal cannula CBC baseline AKI creatinine 1.63 coags normal magnesium normal UA negative.  [place] hip and L-spine showed no acute change from recent MRI but did show redemonstration of severe L4-L5 stenosis secondary to disc bulge.  He received oxycodone and 1 L bolus.Past Medical History: Diagnosis Date  Coronary artery disease   COVID-19 02/08/2022  Dysphagia   Essential hypertension   Hx of CABG   Hyperlipidemia   Premature ventricular contractions   Pulmonary emphysema (HC Code)   Past Surgical History: Procedure Laterality Date  CORONARY ARTERY BYPASS GRAFT  2010  Social History Tobacco Use  Smoking status: Former   Current packs/day: 0.00   Types: Cigarettes   Quit date: 1987   Years since quitting: 37.3  Smokeless tobacco: Never Substance Use Topics  Alcohol use: Yes   Comment: 7 glasses of wine per week, 1 per day  Family History Problem Relation Age of Onset  COPD Brother   Coronary Artery Disease Brother   No Known AllergiesPrior to Admission Medications   Medication Sig Last Dose Dispense Doc. Provider  aspirin 81 mg EC delayed release tablet every 24 hours. -- -- Provider, Historical  ezetimibe (ZETIA) 10 mg tablet Take 1 tablet (10 mg total) by mouth daily. -- 90 tablet Rosinski, Anitra Lauth, MD  magnesium oxide (MAG-OX) 400 mg (241.3 mg magnesium) tablet Take 1 tablet (400 mg total) by mouth daily. -- 90 tablet Sharen Hones, MD  montelukast (SINGULAIR) 10 mg tablet TAKE 1 TABLET(10 MG) BY MOUTH EVERY NIGHT -- 30 tablet Vornovitsky, Earl Lites, MD  omeprazole (PRILOSEC) 20 mg capsule TAKE 1 CAPSULE(20 MG) BY MOUTH DAILY -- 30 capsule Rosinski, Anitra Lauth, MD  omeprazole (PRILOSEC) 20 mg capsule Take 1 capsule (20 mg total) by mouth 2 (two) times daily. -- 180 capsule Aslanian, Carl Best, MD  rosuvastatin (CRESTOR) 40 mg tablet Take 1 tablet (40 mg total) by mouth daily. -- 90 tablet Sharen Hones, MD  sertraline (ZOLOFT) 50 mg tablet sertraline 50 mg tablet TAKE 1 TABLET BY MOUTH ONCE DAILY -- -- Provider, Historical  STIOLTO RESPIMAT 2.5-2.5 mcg/actuation mist for inhalation INHALE 2 INHALATION INTO THE LUNGS DAILY -- 4 g Rosinski, Aleksandra, MD  valsartan (DIOVAN) 80 mg tablet TAKE 1 TABLET(80 MG) BY MOUTH DAILY -- 30 tablet Dagmar Hait, MD  REVIEW OF SYSTEMS Review of Systems Musculoskeletal:  Positive for back  pain. All other systems reviewed and are negative.INPATIENT MEDICATIONS No current facility-administered medications for this encounter. PRN Meds:OBJECTIVE Vital signs in last 24 hours:Temp:  [98.2 ?F (36.8 ?C)] 98.2 ?F (36.8 ?C)Pulse:  [82] 82Resp:  [16] 16BP: (128)/(80) 128/80SpO2:  [95 %] 95 %I/O's:Gross Totals (Last 24 hours) at 06/19/2022 1956Last data filed at 06/19/2022 1932Intake 250 ml Output -- Net 250 ml APPEARANCE: Appropriate for age. No Acute Distress. AOx3 with appropriate affect. HEENT: normocephalic, atraumatic. PERRLA, anicteric. Oropharynx mucosa moist, without swelling, erythema, or exudate.NECK: Supple. No evidence of thyromegaly. No JVD. No Bruits.CHEST:  Decreased air entry upper lobesCARDIOVASCULAR:  Sternotomy scar RRR, S1/S2,  no S3/S4,  no M/G/R. ABDOMEN: Abdomen soft, non-tender, non-distended with no masses, hepatosplenomegaly or hernias.  No CVA tenderness. Normoactive bowel sounds. EXTREMITIES: Peripheral pulses including radial, DP, and PT 2+ and equal B/L. No lower extremity varicosities. No peripheral edema. Brisk capillary refill <2 seconds B/L. No clubbing or cyanosis.LYMPHATICS: Cervical, supraclavicular, axillary, and inguinal nodes without adenopathy.MUSCULOSKELETAL:  Upper extremities full range of motion lower extremity range of motion not tested due to pain SKIN: No rashes, lesions, or subcutaneous nodules. NEUROLOGIC:  Cranial nerves intact, gait not tested due to patient inability secondary to painRECENT LABSRecent Labs   05/08/241435 WBC 10.3 HGB 15.5 HCT 46.60 PLT 150 MCV 102.9* NEUTROPHILS 76.4*  Recent Labs   05/08/241435 NA 139 K 4.3 CL 104 CO2 26 BUN 25* CREATININE 1.63* GLU 92 ANIONGAP 9 CALCIUM 8.2* MG 2.0  Recent Labs   05/08/241435 AST 18 ALT 20 ALKPHOS 54 ALBUMIN 2.8* PROT 5.9* BILITOT 0.4  Recent Labs   05/08/241435 LABPROT 10.9 INR 1.08*  No results for input(s): TROPONINI in the last 72 hours.Invalid input(s): PROBNP No results for input(s): CORTISOL, HGBA1C, TSH, FREET4, LDL, HDL, CHOL, TRIG in the last 72 hours. Recent Labs Lab 05/08/241651 SPECGRAV 1.025 PHUR 6.0 LEUKOCYTESUR Negative NITRITE Negative PROTEINUA Trace GLUCOSEU Negative KETONESU 2+* BILIRUBINUR Negative UROBILINOGEN <2.0 IMAGINGCT Hip Right wo IV ContrastResult Date: 5/8/2024No acute osseous abnormality of the bony pelvis or lumbar spine. 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 [place] [facility] Notify System Classification: Routine. (accession [id number]), Routine. (accession [id number]) Report initiated by:  Lesia Hausen, MD Reported and signed by: Vivianne Spence, MD  [facility] Radiology and Biomedical Imaging [place] Lumbar Spine wo IV ContrastResult Date: 5/8/2024No acute osseous abnormality of the bony pelvis or lumbar spine. 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 [place] [facility] Notify System Classification: Routine. (accession [id number]), Routine. (accession [id number]) Report initiated by:  Lesia Hausen, MD Reported and signed by: Vivianne Spence, MD  [facility] Radiology and Biomedical Imaging  [facility] for orders placed or performed during the hospital encounter of 03/14/22 Echo 2D Complete w Doppler and CFI if Ind Image Enhancement 3D and or bubbles Result Value Ref Range  Reported Biplane EF% 58 %  Narrative  ~ * Normal left ventricular size, wall thickness, systolic function and wall motion. LVEF calculated by biplane Simpson's was 58%.  Normal diastolic function and filling pressures.* Normal right ventricular cavity size and systolic function.* Moderate aortic valve calcification.  Mild aortic stenosis.  Peak aortic velocity is 2.5 m/s with a calculated peak aortic gradient of 26 mmHg.  Mean gradient is 13 mmHg.  Aortic valve area is 1.4 cm2.  Dimensionless index is 0.44.  Mild aortic regurgitation.* Normal mitral valve leaflets.  Mild posterior mitral annular calcification.  Trace mitral regurgitation.*  Mild tricuspid regurgitation with normal right heart pressures.* No significant pericardial effusion.* No prior study available for comparison. EKGResults for orders placed or performed during the hospital encounter of 06/19/22 EKG Result Value Ref Range  Heart Rate 81 bpm  QRS Interval 104 ms  QT Interval 388 ms  QTC Interval 451 ms  P Axis 56 deg  QRS Axis 128 deg  T Wave Axis 59 deg  P-R Interval 160 msec  SEVERITY Otherwise Normal ECG severity MICROBIOLOGY Blood CulturesNo results for input(s): LABBLOO in the last 168 hours. Urine CulturesNo results for input(s): LABURIN in the last 168 hours.Respiratory Cultures No results for input(s): LOWERRESPIRA in the last 168 hours. ASSESSMENT AND PLAN 82 y.o. male admitted with the following active issues:Impression: Intractable back pain secondary to severe spinal stenosis HypoxiaAKIPlan: Intractable back pain secondary to severe spinal stenosis:Imaging unchanged from a few days ago PT OT consult Pain control Ortho consultSteroidsHypoxia:  His last vital sign check oxygen was 92%.  Does have history of asthma COPDChest x-rayO2 and DuoNebs as neededAKI: Hold losartanIV fluidsDiet: Diet Cardiac Consistent CarbohydrateVTE Prophylaxis: Phamocologic thromboprophylaxis initiatedCode Status: FULL CODE/ACLS PROTOCOL.Signed:Keri Veale, MDDepartment of Medicine5/09/2022

## 2022-06-20 NOTE — Plan of Care
Plan of Care Overview/ Patient Status    Assumed care at 0700, patient is alert and oriented x4, able to make needs knownPt denies pain/SOB on this shiftUnable to titrate to RA on this shift, O2 90% RA, goal rate of 93%, 94% on 1LIVF continued, tolerating wellUpon initial assessment, pt noted to have medication from home, medications moved to locked cabinet in back pyxisBed locked in lowest position, call bell within reach, will continue POC

## 2022-06-20 NOTE — ED Notes
7:14 PM Received pt on bed, awake and responsive. Awaiting Castine scan result.IVF on going infusing. Pt eating food. Watched for any unusualities7:33 PM Above IVF consumed.7:46 PM Pt for admission, awaiting hospitalist for eval.8:31 PM Pt seen and examined by Dr Gaye Alken, with order.8:38 PM Called report to floor. Pt ready for transport.

## 2022-06-21 LAB — CBC WITH AUTO DIFFERENTIAL
BKR CHLORIDE: 11.7 x1000/??L — ABNORMAL HIGH (ref 4.0–11.0)
BKR CREATININE: 34.6 pg — ABNORMAL HIGH (ref 27.0–33.0)
BKR WAM ABSOLUTE IMMATURE GRANULOCYTES.: 0.05 x 1000/ÂµL (ref 0.00–0.30)
BKR WAM ABSOLUTE LYMPHOCYTE COUNT.: 1.36 x 1000/ÂµL (ref 0.60–3.70)
BKR WAM ABSOLUTE NRBC (2 DEC): 0 x 1000/ÂµL (ref 0.00–1.00)
BKR WAM ANALYZER ANC: 9.29 x 1000/ÂµL — ABNORMAL HIGH (ref 2.00–7.60)
BKR WAM BASOPHIL ABSOLUTE COUNT.: 0.01 x 1000/ÂµL (ref 0.00–1.00)
BKR WAM BASOPHILS: 0.1 % (ref 0.0–1.4)
BKR WAM EOSINOPHIL ABSOLUTE COUNT.: 0.02 x 1000/ÂµL (ref 0.00–1.00)
BKR WAM EOSINOPHILS: 0.2 % (ref 0.0–5.0)
BKR WAM HEMATOCRIT (2 DEC): 40 % (ref 38.50–50.00)
BKR WAM HEMOGLOBIN: 13.6 g/dL (ref 13.2–17.1)
BKR WAM IMMATURE GRANULOCYTES: 0.4 % (ref 0.0–1.0)
BKR WAM LYMPHOCYTES: 11.6 % — ABNORMAL LOW (ref 17.0–50.0)
BKR WAM MCH (PG): 34.6 pg — ABNORMAL HIGH (ref 27.0–33.0)
BKR WAM MCHC: 34 g/dL (ref 31.0–36.0)
BKR WAM MCV: 101.8 fL — ABNORMAL HIGH (ref 80.0–100.0)
BKR WAM MONOCYTE ABSOLUTE COUNT.: 0.97 x 1000/ÂµL (ref 0.00–1.00)
BKR WAM MONOCYTES: 8.3 % (ref 4.0–12.0)
BKR WAM MPV: 11.4 fL (ref 8.0–12.0)
BKR WAM NEUTROPHILS: 79.4 % — ABNORMAL HIGH (ref 39.0–72.0)
BKR WAM NUCLEATED RED BLOOD CELLS: 0 % (ref 0.0–1.0)
BKR WAM PLATELETS: 129 x1000/ÂµL — ABNORMAL LOW (ref 150–420)
BKR WAM RDW-CV: 13 % (ref 11.0–15.0)
BKR WAM RED BLOOD CELL COUNT.: 3.93 M/ÂµL — ABNORMAL LOW (ref 4.00–6.00)
BKR WAM WHITE BLOOD CELL COUNT: 11.7 x1000/ÂµL — ABNORMAL HIGH (ref 4.0–11.0)

## 2022-06-21 LAB — BASIC METABOLIC PANEL
BKR ANION GAP: 6 (ref 5–16)
BKR BLOOD UREA NITROGEN: 23 mg/dL — ABNORMAL HIGH (ref 7–18)
BKR BUN / CREAT RATIO: 16.5 (ref 8.0–23.0)
BKR CALCIUM: 8.2 mg/dL — ABNORMAL LOW (ref 8.5–10.5)
BKR CO2: 25 mmol/L — ABNORMAL LOW (ref 21–32)
BKR EGFR, CREATININE (CKD-EPI 2021): 51 mL/min/{1.73_m2} — ABNORMAL LOW (ref >=60–420)
BKR GLUCOSE: 119 mg/dL — ABNORMAL HIGH (ref 70–100)
BKR POTASSIUM: 4.4 mmol/L (ref 3.5–5.1)
BKR SODIUM: 137 mmol/L (ref 136–145)

## 2022-06-21 MED ORDER — AMPICILLIN-SULBACTAM (UNASYN) 1.5GM MBP
Freq: Three times a day (TID) | INTRAVENOUS | Status: DC
Start: 2022-06-21 — End: 2022-06-22
  Administered 2022-06-21 – 2022-06-22 (×3): 50.000 mL/h via INTRAVENOUS

## 2022-06-21 MED ORDER — BISACODYL 5 MG TABLET,DELAYED RELEASE
5 mg | Freq: Once | ORAL | Status: CP
Start: 2022-06-21 — End: ?
  Administered 2022-06-21: 13:00:00 5 mg via ORAL

## 2022-06-21 MED ORDER — IPRATROPIUM 0.5 MG-ALBUTEROL 3 MG (2.5 MG BASE)/3 ML NEBULIZATION SOLN
0.5-32.53 mg-3 mg(2.5 mg base)/3 mL | RESPIRATORY_TRACT | Status: DC | PRN
Start: 2022-06-21 — End: 2022-06-22

## 2022-06-21 MED ORDER — SENNOSIDES 8.6 MG TABLET
8.6 mg | Freq: Every evening | ORAL | Status: DC
Start: 2022-06-21 — End: 2022-06-22
  Administered 2022-06-22: 01:00:00 8.6 mg via ORAL

## 2022-06-21 MED ORDER — POLYETHYLENE GLYCOL 3350 17 GRAM ORAL POWDER PACKET
17 gram | Freq: Every day | ORAL | Status: DC
Start: 2022-06-21 — End: 2022-06-22
  Administered 2022-06-21 – 2022-06-22 (×2): 17 gram via ORAL

## 2022-06-21 NOTE — Progress Notes
[facility]ospitalist Progress NoteDonald [phone] Hospital Day: 3 Active IssuesSymptomatic severe spinal stenosis at L4Aspiration pneumonitis versus pneumoniaChronic: history of CAD/CABG 2010, asthma/COPD, HTN, HLD, prostate cancer status post radiation therapyInterval HistoryAfebrile, hemodynamically stable, 97% on air Labwork stable, white count 11 Comfortable on room air but sounds congested with a hacking cough which remains unproductive Procalcitonin lowNo back pain, no shortness of breath, chest pain or palpitationsHas not been out of bed yet Physical ExamTemp:  [97.6 ?F (36.4 ?C)-97.7 ?F (36.5 ?C)] 97.6 ?F (36.4 ?C)Pulse:  [61-64] 63Resp:  [16-18] 18BP: (131-144)/(73-78) 131/73SpO2:  [93 %-97 %] 95 %Device (Oxygen Therapy): room airGross Totals (Last 24 hours) at 06/21/2022 1122Last data filed at 06/21/2022 0905Intake 1716.8 ml Output 2000 ml Net -283.2 ml General: Well-developed and well-nourished elderly male, pleasant/NADHEENT: NCAT, EOMI, PERRLA, MMMNeck: Supple, no JVD, no thyromegalyHeart: RRR; no rubs, gallops or murmursLungs:  Breath sounds are coarse, rhonchi in the left midlung field and base, paroxysmal coughing with deep breathing; good inspiratory effort and good air movementAbdomen: Soft, nontender, nondistended, normoactive bowel soundsExtremities: Warm and well-perfused, no CCESkin: Normal texture, turgor and colorNeuro: Alert and oriented ?3; cranial nerves II through XII grossly intact; strength and sensation grossly intact; active SLRs bilaterally remain without pain or apparent weaknessPsych: Mood and affect appropriate and unremarkableAssessment/Plan79 year old male discharged [facility] days ago from a community hospital in [facility] after treatment for severe stenosis at L4 presents with worsening back pain and inability to ambulate; history of CAD/CABG 2010, asthma/COPD, HTN, HLD, prostate cancer status post radiation therapy:Severe spinal stenosis at L4: Imaging appears unchanged No red flag symptomsAssistance from Orthopedics appreciated; will arrange outpatient follow-upContinue a short course of low-dose prednisoneScheduled TylenolHold on Toradol for now given AKI and steroid therapyNot requiring narcotic analgesiaMobilize, PT/OTWill need short-term rehabConcern for aspiration pneumonitis versus pneumonia: On Rocephin empirically Worsening congestion and left-sided rhonchi on exam Switch antibiotic coverage to Unasyn Procalcitonin low-- Check a respiratory viral panel AKI: Holding PTA losartan (BP remains well controlled here)A review of Epic shows creatinine is roughly at baseline Good urinary output Hep-Lock IV fluids and encourage oral hydrationOver 35 minutes spent on the care of this patient today; over 50% of that time was spent in counseling and coordination of care.Gwen Pounds MD5/9/2024Hospitalist Service [name] 	Inpatient MedsCurrent Facility-Administered Medications Medication Dose Route Frequency Last Rate  acetaminophen  1,000 mg Oral Q8H    acetaminophen  650 mg Oral Q6H PRN    ampicillin-sulbactam  1.5 g Intravenous Q8H    aspirin  81 mg Oral Daily     enoxaparin prophylaxis dosing  40 mg Subcutaneous Daily    gabapentin  100 mg Oral Nightly    ipratropium-albuteroL  3 mL Nebulization Q4H PRN    [Held by provider] losartan  50 mg Oral Daily    magnesium oxide  400 mg Oral Daily    ondansetron  4 mg Oral Q6H PRN    Or  ondansetron (ZOFRAN) IV Push  4 mg IV Push Q6H PRN    pantoprazole  40 mg Oral Daily    polyethylene glycol  17 g Oral Daily    predniSONE  40 mg Oral Daily    rosuvastatin  40 mg Oral Daily    senna  1 tablet Oral Nightly    sodium chloride  3 mL IV Push Q8H    sodium chloride  3 mL IV Push PRN for Line Care    sodium chloride  75 mL/hr Intravenous Continuous 75 mL/hr (06/21/22 0611)  tiotropium-olodateroL  2 Inhalation  Inhalation Daily   Reviewed DataRecent Labs Lab 05/08/241435 05/09/240615 05/10/240729 WBC 10.3 5.9 11.7* HGB 15.5 14.8 13.6 HCT 46.60 43.80 40.00 PLT 150 143* 129* MCV 102.9* 101.4* 101.8* INR 1.08*  --   --  Recent Labs   05/08/241435 05/09/240615 05/10/240729 NA 139 134* 137 K 4.3 4.7 4.4 CL 104 102 106 CO2 26 23 25  BUN 25* 27* 23* CREATININE 1.63* 1.37* 1.39* ANIONGAP 9 9 6  CALCIUM 8.2* 8.0* 8.2* MG 2.0 1.9  --  AST 18 20  --  ALT 20 22  --  ALKPHOS 54 55  --  ALBUMIN 2.8* 2.7*  --  GLOB 3.1 3.1  --  No results for input(s): POCLAC, LACTATE in the last 168 hours.No results for input(s): TROPONINI in the last 168 hours.@Lab  Results Component Value Date  CHOL 176 02/15/2021  HDL 77 02/15/2021  TRIG 71 02/15/2021 Recent Labs Lab 05/08/241651 SPECGRAV 1.025 PHUR 6.0 LEUKOCYTESUR Negative NITRITE Negative PROTEINUA Trace GLUCOSEU Negative KETONESU 2+* BILIRUBINUR Negative UROBILINOGEN <2.0 No results for input(s): LABBLOO in the last 168 hours.No results for input(s): LABURIN in the last 168 hours.No results for input(s): LOWERRESPIRA in the last 168 hours.XR Chest PA or APResult Date: 06/19/2022 Left basilar consolidation which may represent aspiration. 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 [facility] Radiology Communication Center: Routine. Reported and signed by: Leilani Merl, MD  [facility] Radiology and Biomedical Imaging [place] Hip Right wo IV ContrastResult Date: 5/8/2024No acute osseous abnormality of the bony pelvis or lumbar spine. 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 [place] [facility] Notify System Classification: Routine. (accession [id number]), Routine. (accession [id number]) Report initiated by:  Lesia Hausen, MD Reported and signed by: Vivianne Spence, MD  [facility] Radiology and Biomedical Imaging [place] Lumbar Spine wo IV ContrastResult Date: 5/8/2024No acute osseous abnormality of the bony pelvis or lumbar spine. 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 [place] [facility] Notify System Classification: Routine. (accession [id number]), Routine. (accession [id number]) Report initiated by:  Lesia Hausen, MD Reported and signed by: Vivianne Spence, MD  [facility] Radiology and Biomedical Imaging  No results found.Results for orders placed or performed during the hospital encounter of 06/19/22 EKG Result Value Ref Range  Heart Rate 81 bpm  QRS Interval 104 ms  QT Interval 388 ms  QTC Interval 451 ms  P Axis 56 deg  QRS Axis 128 deg  T Wave Axis 59 deg  P-R Interval 160 msec  SEVERITY Otherwise Normal ECG severity Results for orders placed or performed during the hospital encounter of 03/14/22 Echo 2D Complete w Doppler and CFI if Ind Image Enhancement 3D and or bubbles Result Value Ref Range  Reported Biplane EF% 58 %  Narrative  ~ * Normal left ventricular size, wall thickness, systolic function and wall motion. LVEF calculated by biplane Simpson's was 58%.  Normal diastolic function and filling pressures.* Normal right ventricular cavity size and systolic function.* Moderate aortic valve calcification.  Mild aortic stenosis.  Peak aortic velocity is 2.5 m/s with a calculated peak aortic gradient of 26 mmHg.  Mean gradient is 13 mmHg.  Aortic valve area is 1.4 cm2.  Dimensionless index is 0.44.  Mild aortic regurgitation.* Normal mitral valve leaflets.  Mild posterior mitral annular calcification.  Trace mitral regurgitation.* Mild tricuspid regurgitation with normal right heart pressures.* No significant pericardial effusion.* No  prior study available for comparison.

## 2022-06-21 NOTE — Plan of Care
Plan of Care Overview/ Patient Status    SOCIAL WORK SCREENINGPatient Name: Raymond Gardner Record Number: [id number] Date of Birth: 23-Apr-1942SW Admission Screening  Flowsheet Row Most Recent Value SW Admission Screening  Concern for current abuse/neglect/interpersonal violence/sexual assault  No Concern for current homelessness No Current behavioral health concerns No Concern for active substance use No Incomplete emergency contact or next of kin on record No Patient identity unknown No Group home or residential placement No Guardianship or conservatorship No  SOCIAL WORK ASSESSMENTPatient Name: Raymond Gardner Record Number: [id number] Date of Birth: [birth year]Medical Social Work Assessment Adult  Loss adjuster, chartered Most Recent Value Rendered Accommodations (Leave blank if none rendered or patient/family supplied their own hearing devices/glasses)  Other language interpreter used (non-ASL)? No Admission Information  Document Type Clinical Assessment - Able to Assess (For Inpatient/ED Only) Prior psychosocial assessment has been documented within this hospitalization No (For Inpatient/ED Only) Prior psychosocial assessment has been documented within 30 days of this hospitalization No Reason for Current Social Work Involvement Support/Coping, Goals of Care Source of Information Patient Record Reviewed Yes Level of Care Inpatient Assessment has been completed within 30 days of this encounter  (For Inpatient/ED Only) Prior psychosocial assessment has been documented within 30 days of this hospitalization No Relationships  Marital Status Widowed Lives With Child(ren), Adult Family circumstances Raymond Gardner has been living with his son Raymond Gardner), his daughter-in-law Raymond Gardner) and their four children. Raymond Gardner's wife passed away three years ago. Informal Supports (family, friends, church, Catering manager) Raymond Gardner reported having a son in [place] as well as a son here in [place]) that he has close relationships with. Formal Supports (current community resources and providers) None were discussed at time of assessment. Pertinent spiritual or cultural factors None were discussed at time of assessment. Abuse Screen (yes response referral indicated)  Able to respond to abuse questions Yes Is there anyone in your life that is hurting or threatening you in anyway? no Safety Plan/Comments Not applicable at this time. Physical Indicators of Abuse No evidence of physical abuse Education Provided (Read to Patient and Parent)? no Would it be ok if we add this resource to your DC paperwork? no IF Assessment has been completed within 30 days of this encounter  Update to Adult Abuse Screen: No changes to this area occurred since the last assessment Language needed None, Patient Speaks English Literacy Read/write independently Special Needs None reported at time of assessment. Social Determinants of Health  Financial Concerns None Vocational and employment status and history Raymond Gardner is retired. What is your living situation today? I have a steady place to live Where have you been sleeping? staying with others or in a hotel Think about the place you live. Do you have problems with any of the following? None In the past 12 months has the electric, gas, oil, or water company threatened to shut off services in your home? No Within the past 12 months, you worried that your food would run out before you got the money to buy more. Never true Within the past 12 months, the food you bought just didn't last and you didn't have money to get more. Never true In the past 12 months, has lack of transportation kept you from medical appointments or from getting medications? Yes In the past 12 months, has lack of transportation kept you from meetings, work, or from getting things needed for daily living? Yes Housing/Transportation/Environmental Comment (use soup kitchens, food pantries, eviction pending, unable to afford) Raymond Gardner has been living family. Mental Status  Mental  Status Able to Assess Appearance well-groomed, developmentally appropriate Attitude/Demeanor/Rapport cooperative, crying, grieving, pleasant, appropriate to circumstances Affect (typically observed) sad, adaptable, appropriately reactive, pleasant, stable Orientation no deficits recognized Insight Fair Health Insight/Judgment no deficits recognized Reaction to Event/Health Status Adjusting, Difficulty accepting, Motivated, Realistic Suicide Risk Assessment  Reason for Assessment Utilizing SAFE-T and C-SSRS (Check all that apply) Social Work Consult/Assessment C-SSRS Able to Assess Screening for suicidal ideation within Past Month In the past month have you wished you were dead or wished you could go to sleep and not wake up? no In the past month have you actually had any thoughts of killing yourself? no Have you ever done anything, started to do anything, or prepared to do anything to end your life? no [place] Suicide Risk Level low risk Specific Questions about Thoughts, Plans, Suicidal Intent (SAFE-T) Negative responses above do not indicate a need for SAFE-T assessment Risk Assessment  Access to Firearms? No Concern for patient utilizing another lethal method of self-harm, suicide, or harm to others No Risk Assessment Able to Assess Risk to Self Able to Assess Risk to Self - Self-Injurious Behavior Patient states no current harm to self Attitudes regarding Self-Injury Patient states no current urges to harm self Imminent Risk for Self-Injury in Community Low Imminent Risk for Self-Injury in Facility Low Risk to Others Able to Assess Risk to Others Patient states no current harm to others Attitude regarding Aggression / Violence Patient states no current urges to harm others Imminent Risk for Violence in Community Low Imminent Risk for Violence in Facility Low Current and Past Psychiatric Diagnoses Able to Assess Mood Disorder No Anxiety Disorder No Psychotic Disorder No Substance Use Disorder No Post-Traumatic Stress Disorder (PTSD) No Attention Deficit/Hyperactivity Disorder (ADHD) No Traumatic Brain Injury (TBI) No Cluster B Personality Disorders or Traits (i.e. Borderline, Antisocial, Histrionic & Narcissistic) No Conduct Problems (Antisocial Behavior, Aggression, Impulsivity) No Suicide Attempt No Prior Attempts Presenting Symptoms None Family History None reported Precipitants/Stressors Chronic physical pain/Other acute medical, Perceived burden on others Change in Mental Health or Substance Use Disorder Treatment Not receiving treatment General Risk Factors None General Protective Factors cooperative with interview, able to express feelings, able to define needs, able to engage others, positive coping skills, future oriented, positive social supports committed and able to help, motivated for treatment This patient was screened using the [place] Suicide Severity Rating Scale (CSSRS)  Yes I conducted a suicide risk assessment including a suicide inquiry and assessment of risk and protective factors, as recommended by the standard Suicide Assessment Five-Step Evaluation and Triage (SAFE-T) for Mental Health Professionals. No, the C-SSRS did not produce a positive screen Cause for concern None Based on my assessment, the level of risk for this patient to suicide in an inpatient or emergency setting is:  MINIMAL because the patient does not present with suicidal ideation, does not have a history of suicide attempts, and the balance of protective factors outweighs any current risk factors Based on my assessment, the level of risk for this patient to suicide in the community is:  MINIMAL Recommended Next Steps Remain in/Return to Community Remain in/Return to [employer] Patient reports Patient reports No current ideation of suicide, No intent, No plan, No access to means of self-harm, No access to weapons Substance Use  Active substance use Reports none Coping  Reaction to Event/Health Status Adjusting, Difficulty accepting, Motivated, Realistic Medically Ready for Discharge  Is this patient medically ready for discharge? No Expected Discharge Date 06/22/22 Discharge Plan  Expected Discharge  Date 06/22/22 Patient/Patient Representative goals/treatment preferences for discharge are:  DC needs unclear at this time Formulation: Recommendation(s) and Intervention(s) (including for discharge to occur)  Psychosocial issues requiring intervention Social work involved to complete social work admission screen and to provide support due to hospitalization. Psychosocial interventions 30 minutes spent in direct contact with Raymond Gardner. Social Work Admission Screen completed. Jemaine was seen in the emergency room on 06/19/2022, for pain relating to an injury he sustained from falling while on vacation. I introduced myself and my role. Raymond Gardner presented as alert and oriented. Raymond Gardner was willing to engage in conversation regarding safety and needs assessment. I utilized active and empathetic listening while Raymond Gardner shared information about his admission and concerns about his recovery. Raymond Gardner reporting living with his adult son, his daughter in-law and their four children. Raymond Gardner reported no concerns regarding his safety, interpersonal violence, transportation and housing. He reported having a close and supportive relationships with those he lives in the home with as well as with his other son who lives in [place]. Raymond Gardner reported no current or history of suicidal ideations, suicide attempts or self-harming behaviors. When asked, Vyncent reported no concerns for himself related to mental health nor substance abuse. Mirza became tearful when discussing how his current condition limits his mobility and independence. He expressed ?the longer I sit the weaker it feels I get.? Tilford expressed he is willing and committed to following through with any recommendations the treatment team may have in hope to progress in his recovery. I validated his feelings as he discussed the burden he feels on his family at times because he is normally very independent but also understands they can?t be home with him during the day because they work and then also have their children and pets with them at home as well. Raymond Gardner stated he ?didn?t want to be forgotten about? as he stated he ?knows there are other people that need more urgent attention? but that he hopes he can get the attention and care he needs to move forward in his recovery. I reassured Raymond Gardner he would not be forgotten and encouraged him to speak with his treatment team if he had any questions regarding the status of his care. Collaborations None Specific referrals to enhance community supports (include existing and new resources) None Handoff Required? No Next Steps/Plan (including hand-off): Saheed stated he was not in need of any additional support from social work at this time. Social work support will be available throughout hospital stay. Was the authorized person Charity fundraiser, legal guardian, and/or healthcare representative) notified? Spoke in person Signature: Carmel [place], [place] Contact Information: [phone]  Carmel [place], LCSWClinical Social [phone]

## 2022-06-21 NOTE — Care Coordination-Inpatient
Patient agreeable to bed acceptance at [facility]. Discharge planned for this weekend. IM signed and faxed to [employer]. Michaela Corner, TC II Case Management BH8:00-4:30 M-F

## 2022-06-21 NOTE — Plan of Care
Problem: Adult Inpatient Plan of CareGoal: Plan of Care ReviewOutcome: Interventions implemented as appropriateGoal: Patient-Specific Goal (Individualized)Outcome: Interventions implemented as appropriateGoal: Absence of Hospital-Acquired Illness or InjuryOutcome: Interventions implemented as appropriateGoal: Optimal Comfort and WellbeingOutcome: Interventions implemented as appropriateGoal: Readiness for Transition of CareOutcome: Interventions implemented as appropriate Plan of Care Overview/ Patient Status    Vitals:  06/20/22 2157 BP: (!) 143/73 Pulse: 61 Resp: 16 Temp: 97.6 ?F (36.4 ?C) SpO2: 97% Patient alert and oriented, denies any pain or discomfort at this time. All meds administered per MAR. Patient resting in bed comfortably. Call light within reach. Poc continues.

## 2022-06-21 NOTE — Plan of Care
Plan of Care Overview/ Patient Status    Assumed care at 0700, patient is alert and oriented x4, able to make needs knownPt denies pain/SOB on this shiftPt tolerating RA above goal rate of 93%IVF continued, tolerating wellPt reported feelings of constipation, last reported BM on 5/7, MD Benton made aware, scheduled miralax and one time dulcolax ordered and administeredBed locked in lowest position, call bell within reach, will continue POC

## 2022-06-21 NOTE — Plan of Care
Plan of Care Overview/ Patient Status    Pasrr faxed to [facility]. Myan Locatelli RN Case Manager

## 2022-06-22 DIAGNOSIS — I10 Essential (primary) hypertension: Secondary | ICD-10-CM

## 2022-06-22 DIAGNOSIS — Z8546 Personal history of malignant neoplasm of prostate: Secondary | ICD-10-CM

## 2022-06-22 DIAGNOSIS — I2510 Atherosclerotic heart disease of native coronary artery without angina pectoris: Secondary | ICD-10-CM

## 2022-06-22 DIAGNOSIS — N179 Acute kidney failure, unspecified: Secondary | ICD-10-CM

## 2022-06-22 DIAGNOSIS — Z87891 Personal history of nicotine dependence: Secondary | ICD-10-CM

## 2022-06-22 DIAGNOSIS — Z951 Presence of aortocoronary bypass graft: Secondary | ICD-10-CM

## 2022-06-22 DIAGNOSIS — Z79899 Other long term (current) drug therapy: Secondary | ICD-10-CM

## 2022-06-22 DIAGNOSIS — Z8616 Personal history of COVID-19: Secondary | ICD-10-CM

## 2022-06-22 DIAGNOSIS — E785 Hyperlipidemia, unspecified: Secondary | ICD-10-CM

## 2022-06-22 DIAGNOSIS — R0902 Hypoxemia: Secondary | ICD-10-CM

## 2022-06-22 DIAGNOSIS — J439 Emphysema, unspecified: Secondary | ICD-10-CM

## 2022-06-22 DIAGNOSIS — Z923 Personal history of irradiation: Secondary | ICD-10-CM

## 2022-06-22 DIAGNOSIS — M48061 Spinal stenosis, lumbar region without neurogenic claudication: Secondary | ICD-10-CM

## 2022-06-22 LAB — CBC WITH AUTO DIFFERENTIAL
BKR CALCIUM: 34.6 g/dL — ABNORMAL LOW (ref 31.0–36.0)
BKR WAM ABSOLUTE IMMATURE GRANULOCYTES.: 0.05 x 1000/ÂµL (ref 0.00–0.30)
BKR WAM ABSOLUTE LYMPHOCYTE COUNT.: 1.26 x 1000/??L (ref 0.60–3.70)
BKR WAM ABSOLUTE NRBC (2 DEC): 0 x 1000/??L (ref 0.00–1.00)
BKR WAM ANALYZER ANC: 9.1 x 1000/ÂµL — ABNORMAL HIGH (ref 2.00–7.60)
BKR WAM BASOPHIL ABSOLUTE COUNT.: 0.01 x 1000/ÂµL (ref 0.00–1.00)
BKR WAM BASOPHILS: 0.1 % (ref 0.0–1.4)
BKR WAM EOSINOPHIL ABSOLUTE COUNT.: 0 x 1000/??L (ref 0.00–1.00)
BKR WAM EOSINOPHILS: 0 % (ref 0.0–5.0)
BKR WAM HEMATOCRIT (2 DEC): 38.4 % — ABNORMAL LOW (ref 38.50–50.00)
BKR WAM HEMOGLOBIN: 13.3 g/dL (ref 13.2–17.1)
BKR WAM IMMATURE GRANULOCYTES: 0.4 % (ref 0.0–1.0)
BKR WAM LYMPHOCYTES: 11.3 % — ABNORMAL LOW (ref 17.0–50.0)
BKR WAM MCH (PG): 34.8 pg — ABNORMAL HIGH (ref 27.0–33.0)
BKR WAM MCHC: 34.6 g/dL (ref 31.0–36.0)
BKR WAM MCV: 100.5 fL — ABNORMAL HIGH (ref 80.0–100.0)
BKR WAM MONOCYTE ABSOLUTE COUNT.: 0.71 x 1000/??L (ref 0.00–1.00)
BKR WAM MONOCYTES: 6.4 % (ref 4.0–12.0)
BKR WAM MPV: 12.2 fL — ABNORMAL HIGH (ref 8.0–12.0)
BKR WAM NEUTROPHILS: 81.8 % — ABNORMAL HIGH (ref 39.0–72.0)
BKR WAM NUCLEATED RED BLOOD CELLS: 0 % (ref 0.0–1.0)
BKR WAM PLATELETS: 127 x1000/ÂµL — ABNORMAL LOW (ref 150–420)
BKR WAM RDW-CV: 13.1 % (ref 11.0–15.0)
BKR WAM RED BLOOD CELL COUNT.: 3.82 M/ÂµL — ABNORMAL LOW (ref 4.00–6.00)
BKR WAM WHITE BLOOD CELL COUNT: 11.1 x1000/ÂµL — ABNORMAL HIGH (ref 4.0–11.0)

## 2022-06-22 LAB — RESPIRATORY VIRUS PCR PANEL     (BH GH LMW Q YH)
BKR ADENOVIRUS: NOT DETECTED % (ref 4.0–12.0)
BKR HUMAN METAPNEUMOVIRUS (HMPV): NOT DETECTED
BKR INFLUENZA A H1: NOT DETECTED
BKR INFLUENZA A H3: NOT DETECTED % (ref 0.0–1.0)
BKR INFLUENZA A: NOT DETECTED
BKR INFLUENZA B: NOT DETECTED
BKR PARAINFLUENZA VIRUS 2: NOT DETECTED
BKR PARAINFLUENZA VIRUS 4: NOT DETECTED
BKR RESPIRATORY SYNCYTIAL VIRUS A: NOT DETECTED
BKR RESPIRATORY SYNCYTIAL VIRUS B: NOT DETECTED
BKR RHINOVIRUS: NOT DETECTED
BKR SARS-COV-2 RNA (COVID-19) (YH): NEGATIVE
BKR WAM BASOPHIL ABSOLUTE COUNT.: NOT DETECTED x 1000/??L (ref 0.00–1.00)

## 2022-06-22 LAB — BASIC METABOLIC PANEL
BKR ANION GAP: 6 (ref 5–16)
BKR BLOOD UREA NITROGEN: 20 mg/dL — ABNORMAL HIGH (ref 7–18)
BKR BUN / CREAT RATIO: 16 % (ref 8.0–23.0)
BKR CHLORIDE: 105 mmol/L — ABNORMAL HIGH (ref 98–107)
BKR CO2: 27 mmol/L (ref 21–32)
BKR CREATININE: 1.25 mg/dL — ABNORMAL HIGH (ref 0.80–1.30)
BKR EGFR, CREATININE (CKD-EPI 2021): 57 mL/min/{1.73_m2} — ABNORMAL LOW (ref >=60–420)
BKR GLUCOSE: 115 mg/dL — ABNORMAL HIGH (ref 70–100)
BKR POTASSIUM: 4.2 mmol/L (ref 3.5–5.1)
BKR SODIUM: 138 mmol/L (ref 136–145)

## 2022-06-22 MED ORDER — ACETAMINOPHEN 500 MG TABLET
500 mg | ORAL_TABLET | Freq: Three times a day (TID) | ORAL | 1 refills | Status: AC
Start: 2022-06-22 — End: ?

## 2022-06-22 MED ORDER — POLYETHYLENE GLYCOL 3350 17 GRAM ORAL POWDER PACKET
17 gram | Freq: Every day | ORAL | 3 refills | Status: AC
Start: 2022-06-22 — End: ?

## 2022-06-22 MED ORDER — AMOXICILLIN 875 MG-POTASSIUM CLAVULANATE 125 MG TABLET
875-125 mg | ORAL_TABLET | Freq: Two times a day (BID) | ORAL | 1 refills | Status: AC
Start: 2022-06-22 — End: ?

## 2022-06-22 MED ORDER — SENNOSIDES 8.6 MG TABLET
8.6 mg | ORAL_TABLET | Freq: Every evening | ORAL | 12 refills | Status: AC
Start: 2022-06-22 — End: ?

## 2022-06-22 MED ORDER — PREDNISONE 20 MG TABLET
20 mg | ORAL | Status: AC
Start: 2022-06-22 — End: ?

## 2022-06-22 NOTE — Other
Raymond Gardner was discharged via [facility] Medical illustrator). Written discharge instructions provided. Denies any further questions. Printed W-10, AVS and Discharge Summary placed in the discharge folder. IV access removed, no active bleeding observed. All belongings packed. W-10 faxed to [facility]. Verbal report given to [employer]. Vital signs    Vitals:  06/21/22 0905 06/21/22 1336 06/21/22 2037 06/22/22 0511 BP:  (!) 158/87 (!) 154/83 (!) 156/74 Pulse:  72 62 (!) 47 Resp:  20 20 18  Temp:  98.1 ?F (36.7 ?C) 97.8 ?F (36.6 ?C) 97.3 ?F (36.3 ?C) TempSrc:  Oral Oral Oral SpO2: 95% 95% 96% 97% Weight:     Height:

## 2022-06-22 NOTE — Plan of Care
Plan of Care Overview/ Patient Status    SOCIAL WORK NOTEPatient Name: Raymond Gardner Record Number: [id number] Date of Birth: 1942/10/07Medical Social Work Follow Up  [employer] Most Recent Value Admission Information  Document Type Progress Note (For Inpatient/ED Only) Prior psychosocial assessment has been documented within this hospitalization No (For Inpatient/ED Only) Prior psychosocial assessment has been documented within 30 days of this hospitalization No Reason for Current Social Work Involvement Support/Coping, Goals of Care Source of Information Patient Record Reviewed Yes Level of Care Inpatient Psychosocial issues requiring intervention Social work involvement to conduct follow-up to provide support due to hospitalization. Psychosocial interventions 10 minutes spent in direct contact with Raymond Gardner as a follow-up check-in from social work?s previous interaction with him on 06/20/2022. Raymond Gardner presented as alert and oriented. Raymond Gardner presented in a much more positive and optimistic mood evident by his smiling and laughter during the conversation. He expressed being appreciative of the care he has received while at [facility] and thankful for the follow-up. He reported he was transitioning to a rehab facility as his discharge plan and stated although this is ?not home? that it is still ?one step closer.? Raymond Gardner reported no current suicidal/homicidal ideations, suicide attempts, self-harming behaviors or any other safety concerns.  Raymond Gardner denied any other social work related needs at this time and is pleased with his discharge plan at this time. Collaborations None Specific referrals to enhance community supports (include existing and new resources) None Handoff Required? No Next Steps/Plan (including hand-off): Social work support will be available throughout hospital stay. Was the authorized person Charity fundraiser, legal guardian, and/or healthcare representative) notified? Spoke in person Signature: Carmel [place], [place] Contact Information: [phone]   Carmel [place], LCSWClinical Social [phone]

## 2022-06-22 NOTE — Plan of Care
Plan of Care Overview/ Patient Status    Uneventful shift.  Patient is very pleasant and cooperative with care.  Very thankful and feels that he is taken care of well at this hospital.  Denies any pain except for when he is out of bed and ambulating with his walker.  POC discussed with patient, verbal understanding expressed.  Unable to swallow Tylenol pills.  Pill cutter obtained from Pharmacy, pills cut in half for patient as requested.

## 2022-06-22 NOTE — Discharge Summary
[facility], [facility] CampusDis[facility] Summary							Raymond FlemingMRN: [id number] AGE: 82 y.o.DOB: 1942-01-30Admit date: 5/8/2024Discharge date: 5/11/2024Discharge Attending Physician: Gwen Pounds, MD  PCP: Elease Etienne, MDPrincipal Diagnosis: Spinal stenosisOther Diagnosis: Spinal stenosisDischarge Condition: stableDisposition: Skilled Nursing Facility for Short Term Rehab. Call made to SNF Provider by Care Team YesNo Known AllergiesPast Medical History: Diagnosis Date  Coronary artery disease   COVID-19 02/08/2022  Dysphagia   Essential hypertension   Hx of CABG   Hyperlipidemia   Premature ventricular contractions   Pulmonary emphysema (HC Code)  Past Surgical History: Procedure Laterality Date  CORONARY ARTERY BYPASS GRAFT  2010 Social History Tobacco Use  Smoking status: Former   Current packs/day: 0.00   Types: Cigarettes   Quit date: 1987   Years since quitting: 37.3  Smokeless tobacco: Never Substance Use Topics  Alcohol use: Yes   Comment: 7 glasses of wine per week, 1 per day Family History Problem Relation Age of Onset  COPD Brother   Coronary Artery Disease Brother  Hospital CourseThe patient is an 82 year old male who was discharged 5 days before presentation from a community hospital in [facility] [place] [facility] after treatment for severe stenosis at L4 who presented with worsening back pain and an inability to ambulate.  He has a history of CAD/CABG in 2010, asthma, COPD, HTN, HLD and prostate cancer status post radiation therapy.  A mild AKI was also treated.In the ED, [employer] of the L-spine demonstrated multilevel degenerative changes with severe stenosis at L4/L5.  These were similar to MRI findings from April 29th.  A chest x-ray demonstrated a left basilar consolidation concerning for aspiration.Admitting creatinine was 1.63 with a baseline of approximately 1.2.  Labwork was otherwise reassuring.  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  Scheduled Tylenol was also started.  Subcu narcotic analgesia was provided for severe pain.  Exam was reassuring with normal passive and active straight leg raises bilaterally.  Physical therapy and occupational therapy followed the patient throughout his hospital course and short-term rehab was recommended.  AKI: Gentle IV hydration was started and prior to admission Cozaar was held.  Creatinine gradually improved and was normal at 1.25 on day of discharge.  PTA Cozaar was resumed on discharge.  Aspiration pneumonitis versus pneumonia: This event was presumed to have occurred prior to admission.  He passed a bedside swallow evaluation readily on admission and tolerated a regular diet for the remainder of his hospital course.Rocephin was started empirically and this was later changed to Unasyn.  Procalcitonin returned low.  Urinary antigens returned negative.  The patient received 3 full days of IV antibiotics in-house and will be discharged on an additional 2 days of Augmentin to complete a total antibiotic course of 5 days.  The patient was discharged to short-term rehab on hospital day 4 in stable condition.  Outpatient follow-up with orthopedics and primary care.Lab and Test ResultsRecent Labs Lab 05/08/241435 05/09/240615 05/10/240729 05/11/240501 WBC 10.3 5.9 11.7* 11.1* HGB 15.5 14.8 13.6 13.3 PLT 150 143* 129* 127* MCV 102.9* 101.4* 101.8* 100.5*  Recent Labs Lab 05/08/241435 LABPROT 10.9 INR 1.08*  Recent Labs Lab 05/08/241435 05/09/240615 05/10/240729 05/11/240501 NA 139 134* 137 138 K 4.3 4.7 4.4 4.2 CL 104 102 106 105 CO2 26 23 25 27  ANIONGAP 9 9 6 6  CALCIUM 8.2* 8.0* 8.2* 8.3* MG 2.0 1.9  --   --  BUN 25* 27* 23* 20* CREATININE 1.63* 1.37* 1.39* 1.25 Estimated Creatinine Clearance: 49 mL/min (by C-G formula based on SCr of 1.25  mg/dL). No results for input(s): TROPONINI, CKTOTAL, CKMB, CKMBINDEX, BNPPRO in the last 168 hours. Recent Labs Lab 05/08/241435 05/09/240615 AST 18 20 ALT 20 22 ALKPHOS 54 55 ALBUMIN 2.8* 2.7* PROT 5.9* 5.8* BILITOT 0.4 0.5  No results for input(s): CORTISOL, HGBA1C, TSH, FREET4, T3TOTAL, LDL, HDL, CHOL, TRIG, VITAMIND25HY in the last 168 hours. Recent Labs   05/08/241435 05/09/240615 05/10/240729 05/11/240501 GLU 92 150* 119* 115* Recent Labs Lab 05/08/241651 SPECGRAV 1.025 PHUR 6.0 LEUKOCYTESUR Negative NITRITE Negative PROTEINUA Trace GLUCOSEU Negative KETONESU 2+* BILIRUBINUR Negative UROBILINOGEN <2.0 Results for orders placed or performed during the hospital encounter of 06/19/22 EKG Result Value Ref Range  Heart Rate 81 bpm  QRS Interval 104 ms  QT Interval 388 ms  QTC Interval 451 ms  P Axis 56 deg  QRS Axis 128 deg  T Wave Axis 59 deg  P-R Interval 160 msec  SEVERITY Otherwise Normal ECG severity XR Chest PA or APResult Date: 5/8/2024XR CHEST PA OR AP Date: 06/19/2022 8:35 PM INDICATION: Hypoxia COMPARISON: CTA CHEST (PE) W IV CONTRAST 2022-02-07 FINDINGS: Lungs and Pleura: Loops of bowel in a hiatal hernia filled with air project over the right lower lobe. No pleural effusion or pneumothorax. Left basilar consolidation. Heart and Mediastinum:  Cardiomediastinal contours are unchanged.  Left basilar consolidation which may represent aspiration. 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 [facility] Radiology Communication Center: Routine. Reported and signed by: Leilani Merl, MD  [facility] Radiology and Biomedical Imaging [place] Hip Right wo IV ContrastResult Date: 5/8/2024CT LUMBAR SPINE WO IV CONTRAST, [place] HIP RIGHT WO IV CONTRAST HISTORY: increased pain unable to walk COMPARISON: [place] HIP RIGHT WO IV CONTRAST 2022-06-19 18:28:23.000; CTA CHEST (PE) W IV CONTRAST 2022-02-07 (accession [id number]), NONE (accession [id number]) TECHNIQUE: [place] images of the right hip and lumbar spine were obtained without contrast. FINDINGS: BOWEL: Scattered colonic diverticulosis without evidence of acute diverticulitis. 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 PERITONEUM: Unremarkable. LYMPH NODES: Unremarkable. VESSELS: Limited noncontrast evaluation of the vessels demonstrates severe atherosclerotic calcifications. 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 BONES & SOFT TISSUE: Superior compression deformity of L5 and anterior compression deformity of T12, favored to be chronic. Additional multilevel degenerative changes of the lumbar spine with grade 1 anterolisthesis of L4 on L5. Mild retrolisthesis of L5 on S1. No acute traumatic listhesis. 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 There is also at least moderate bilateral neural foraminal narrowing at L4-L5 and L5-S1. There is at least moderate spinal stenosis at L3-L4. Left fat-containing direct inguinal hernia. No acute osseous abnormality of the bony pelvis or lumbar spine. 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 [place] Radiology Notify System Classification: Routine. (accession [id number]), Routine. (accession [id number]) Report initiated by:  Lesia Hausen, MD Reported and signed by: Vivianne Spence, MD  [facility] Radiology and Biomedical Imaging [place] Lumbar Spine wo IV ContrastResult Date: 5/8/2024CT LUMBAR SPINE WO IV CONTRAST, [place] HIP RIGHT WO IV CONTRAST HISTORY: increased pain unable to walk COMPARISON: [place] HIP RIGHT WO IV CONTRAST 2022-06-19 18:28:23.000; CTA CHEST (PE) W IV CONTRAST 2022-02-07 (accession [id number]), NONE (accession [id number]) TECHNIQUE: [place] images of the right hip and lumbar spine were obtained without contrast. FINDINGS: BOWEL: Scattered colonic diverticulosis without  evidence of acute diverticulitis. 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 PERITONEUM: Unremarkable. LYMPH NODES: Unremarkable. VESSELS: Limited noncontrast evaluation of the vessels demonstrates severe atherosclerotic calcifications. 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 BONES & SOFT TISSUE: Superior compression deformity of L5 and anterior compression deformity of T12, favored to be chronic. Additional multilevel degenerative changes of the lumbar spine with grade 1 anterolisthesis of L4 on L5. Mild retrolisthesis of L5 on S1. No acute traumatic listhesis. 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 There is also at least moderate bilateral neural foraminal narrowing at L4-L5 and L5-S1. There is at least moderate spinal stenosis at L3-L4. Left fat-containing direct inguinal hernia. No acute osseous abnormality of the bony pelvis or lumbar spine. 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 [place] Radiology Notify System Classification: Routine. (accession [id number]), Routine. (accession [id number]) Report initiated by:  Lesia Hausen, MD Reported and signed by: Vivianne Spence, MD  [facility] Radiology and Biomedical Imaging Discharge Physical ExamTemp:  [97.3 ?F (36.3 ?C)-98.1 ?F (36.7 ?C)] 97.3 ?F (36.3 ?C)Pulse:  [47-72] 47Resp:  [18-20] 18BP: (154-158)/(74-87) 156/74SpO2:  [95 %-97 %] 97 %SpO2:  [95 %-97 %] 97 % (05/11 0511) General: Well-developed, well-nourished, appears stated age, pleasant/appropriate/NAD HEENT: NCAT, EOMI, PERRLA, MMM, no oral lesionsNeck: Supple, no bruits, no LAD, no thyromegalyHeart: RRR; no rubs, gallops or murmursLungs: Clear to auscultation bilaterally; respiratory excursions are unlabored and symmetricalAbdomen: Soft, nontender, nondistended, normoactive bowel sounds, no HSM or palpable massesExtremities: Warm and well-perfused, no CCESkin: Normal texture, turgor and colorNeuro: Alert and oriented ?3; cranial nerves II through XII grossly intact; strength and sensation grossly intactPsych: Mood and affect are appropriate and unremarkableDischarge Instructions1. Please take all medications as instructed.  2. Please follow-up with orthopedic surgery and primary care.    Current Discharge Medication List  START taking these medications  Details acetaminophen (TYLENOL) 500 mg tablet Take 2 tablets (1,000 mg total) by mouth every 8 (eight) hours for 5 days.Qty: 30 tablet, Refills: 0Start date: 06/22/2022, End date: 06/27/2022  amoxicillin-clavulanate (AUGMENTIN) 875-125 mg per tablet Take 1 tablet by mouth every 12 (twelve) hours for 2 days. Starting tomorrowQty: 4 tablet, Refills: 0Start date: 06/22/2022, End date: 06/24/2022  polyethylene glycol (MIRALAX) 17 gram packet Take 1 packet (17 g total) by mouth daily. Mix in 8 ounces of water, juice, soda, coffee or tea prior to taking.Qty: 14 each, Refills: 2Start date: 06/23/2022  predniSONE (DELTASONE) 20 mg tablet Take 2 tablets (40 mg total) by mouth daily for 1 day, THEN 1 tablet (20 mg total) daily for 2 days, THEN 0.5 tablets (10 mg total) daily for 2 days. Take with food..Start date: 06/23/2022, End date: 06/28/2022  senna (SENOKOT) 8.6 mg tablet Take 1 tablet (8.6 mg total) by mouth nightly.Qty: 30 tablet, Refills: 11Start date: 06/22/2022   CONTINUE these medications which have NOT CHANGED  Details albuterol sulfate 90 mcg/actuation HFA aerosol inhaler Inhale 1 puff into the lungs every 6 (six) hours as needed.  aspirin 81 mg EC delayed release tablet every 24 hours.  gabapentin (NEURONTIN) 100 mg capsule Take 1 capsule (100 mg total) by mouth at bedtime.  magnesium oxide (MAG-OX) 400 mg (241.3 mg magnesium) tablet Take 1 tablet (400 mg total) by mouth daily.Qty: 90 tablet, Refills: 3  montelukast (SINGULAIR)  10 mg tablet TAKE 1 TABLET(10 MG) BY MOUTH EVERY NIGHTQty: 30 tablet, Refills: 0  omeprazole (PRILOSEC) 20 mg capsule Take 1 capsule (20 mg total) by mouth 2 (two) times daily.Qty: 180 capsule, Refills: 3  oxyCODONE (ROXICODONE) 5 mg Immediate Release tablet Take 1 tablet (5 mg total) by mouth every 6 (six) hours as needed for pain.  rosuvastatin (CRESTOR) 40 mg tablet Take 1 tablet (40 mg total) by mouth daily.Qty: 90 tablet, Refills: 3  STIOLTO RESPIMAT 2.5-2.5 mcg/actuation mist for inhalation INHALE 2 INHALATION INTO THE LUNGS DAILYQty: 4 g, Refills: 11  valsartan (DIOVAN) 80 mg tablet TAKE 1 TABLET(80 MG) BY MOUTH DAILYQty: 30 tablet, Refills: 0   STOP taking these medications   ezetimibe (ZETIA) 10 mg tablet    predniSONE (DELTASONE) 5 mg tablet dose pack    sertraline (ZOLOFT) 50 mg tablet    Follow-up Information									[facility] [facility] [phone], Wilhemina Bonito, MD[address] [place] [phone] upPlease call for an appointmentRosinski, [place], MD[address] [place] [phone] an appointment as soon as possible for a visit Time spent on discharge 40 minutes.Electronically Signed:Kriston Pasquarello Alpha [place], MD 06/22/2022; 11:14 AM

## 2022-06-22 NOTE — Care Coordination-Inpatient
Patient will be discharged today at 12:00pm via PP ChaircarPatient was notified of PP costMD/RN aware of discharge plan

## 2022-06-22 NOTE — Plan of Care
Problem: Adult Inpatient Plan of CareGoal: Plan of Care ReviewOutcome: Interventions implemented as appropriate Problem: Fall Injury RiskGoal: Absence of Fall and Fall-Related InjuryOutcome: Interventions implemented as appropriate Problem: InfectionGoal: Absence of Infection Signs and SymptomsOutcome: Interventions implemented as appropriate Plan of Care Overview/ Patient Status    Assumed care @0700 .Patient aox4, pleasant. Denies SOB on RA, denies chest pain or any discomfort at all. Medications given whole with water and well tolerated. Ax1 OOB with RW. Safety maintained. Call light and personal belongings within reach. Frequent rounding continues.

## 2022-06-23 ENCOUNTER — Encounter: Admit: 2022-06-23 | Payer: PRIVATE HEALTH INSURANCE | Primary: Internal Medicine

## 2022-07-23 ENCOUNTER — Encounter: Admit: 2022-07-23 | Payer: PRIVATE HEALTH INSURANCE | Primary: Internal Medicine

## 2022-07-23 NOTE — Telephone Encounter
Please Advise

## 2022-07-23 NOTE — Progress Notes
[facility] Care Navigation:  Transition of Care Note Care Navigation spoke with: Family memberSon/PatDischarging Hospital[facility]: [facility]24Hospital Discharge Date: 06/22/2022  SNF Admission Date: 5/11/2024SNF Discharge Date: 6/10/2024Diagnosis: Severe spinal stenosis L4Discharge location: Home with ServicesHOSPITALIZATION:Reason patient admitted: Presents w/back painDiagnosis at discharge: Severe spinal stenosis L4CURRENT STATE:Since discharge patient reports feeling: SameNew symptoms reported include: Patient discharged from SNF on 07/22/22:  Spoke to son/Pat: He is doing ok, but not great; patient still has a lot of back pain some days better than others; denies any numbness/tingling to lower extremities; no longer taking opiate pain medication; ambulates w/walker; has fair appetite; patient lives w/son/Pat; patient was seen by VNS on 07/22/22; PT to be scheduled; denies any medication questions; denies any new medical concerns; scheduled to see Ortho on 07/24/22 to develop POC going forward; was appreciative for the outreach call. Patient cared for by: FamilyLives w/son  REVIEW OF AFTER VISIT SUMMARY DOCUMENT:Patient specific questions regarding discharge: DeniesW10 not in chart at time of call MEDICATION CHANGES:Validated NEW medications to take: N/AValidated Changed medications to take: N/AValidated Stopped medications to NOT take: N/AIssues obtaining prescriptions: NoAdditional medication related notes: Per son, no medication changes to his daily meds as far as he knowsPatient no longer taking Oxycodone or GabapentinFOLLOW-UP APPOINTMENTS and TRANSPORTATION:Patient aware of scheduled appointments: YesOrtho 6/12Awareness and assistance with appointments needing to be scheduled: NoTransportation concerns for follow-up appointment: NoDME and HOME HEALTH SERVICES:Durable medical equipment received: YesContact has been made with home care agency: Lillette Boxer established for follow up labs/tests: NoOther follow up items: NAADDITIONAL PATIENT NEEDS:Identified Social Determinants of Health opportunities: Denies Additional patient needs addressed: Denies  Transitional Care Management EligibleQualifies for TCM visit to be completed by:  6/24/24Has Transition of Care appointment scheduled on:  TBD

## 2022-07-30 ENCOUNTER — Encounter
Admit: 2022-07-30 | Payer: PRIVATE HEALTH INSURANCE | Attending: Vascular and Interventional Radiology | Primary: Internal Medicine

## 2022-07-31 ENCOUNTER — Telehealth: Admit: 2022-07-31 | Payer: PRIVATE HEALTH INSURANCE | Attending: Internal Medicine | Primary: Internal Medicine

## 2022-08-01 NOTE — Unmapped
Pt scheduled  

## 2022-08-08 ENCOUNTER — Encounter
Admit: 2022-08-08 | Payer: PRIVATE HEALTH INSURANCE | Attending: Vascular and Interventional Radiology | Primary: Internal Medicine

## 2022-08-20 ENCOUNTER — Encounter: Admit: 2022-08-20 | Payer: MEDICARE | Attending: Internal Medicine | Primary: Internal Medicine

## 2022-08-21 ENCOUNTER — Encounter: Admit: 2022-08-21 | Payer: PRIVATE HEALTH INSURANCE | Attending: Internal Medicine | Primary: Internal Medicine

## 2022-08-23 ENCOUNTER — Encounter: Admit: 2022-08-23 | Payer: PRIVATE HEALTH INSURANCE | Attending: Internal Medicine | Primary: Internal Medicine

## 2022-08-23 ENCOUNTER — Ambulatory Visit: Admit: 2022-08-23 | Payer: MEDICARE | Attending: Internal Medicine | Primary: Internal Medicine

## 2022-08-23 MED ORDER — MAGNESIUM OXIDE 400 MG (241.3 MG MAGNESIUM) TABLET
400 | ORAL_TABLET | Freq: Every day | ORAL | 4 refills | 90.00000 days | Status: AC
Start: 2022-08-23 — End: ?

## 2022-09-12 ENCOUNTER — Encounter
Admit: 2022-09-12 | Payer: PRIVATE HEALTH INSURANCE | Attending: Vascular and Interventional Radiology | Primary: Internal Medicine

## 2022-10-23 ENCOUNTER — Encounter: Admit: 2022-10-23 | Payer: MEDICARE | Primary: Internal Medicine

## 2022-11-12 ENCOUNTER — Encounter: Admit: 2022-11-12 | Payer: PRIVATE HEALTH INSURANCE | Primary: Internal Medicine

## 2022-11-12 MED ORDER — EZETIMIBE 10 MG TABLET
10 | ORAL_TABLET | Freq: Every day | ORAL | 5 refills | 90.00000 days | Status: AC
Start: 2022-11-12 — End: ?

## 2022-12-19 ENCOUNTER — Inpatient Hospital Stay
Admit: 2022-12-19 | Discharge: 2022-12-22 | Payer: MEDICARE | Attending: Student in an Organized Health Care Education/Training Program | Admitting: Internal Medicine | Primary: Internal Medicine

## 2022-12-19 ENCOUNTER — Encounter: Admit: 2022-12-19 | Payer: PRIVATE HEALTH INSURANCE | Primary: Internal Medicine

## 2022-12-19 ENCOUNTER — Ambulatory Visit
Admit: 2022-12-19 | Payer: MEDICARE | Attending: Student in an Organized Health Care Education/Training Program | Primary: Internal Medicine

## 2022-12-19 ENCOUNTER — Emergency Department: Admit: 2022-12-19 | Payer: MEDICARE | Primary: Internal Medicine

## 2022-12-19 ENCOUNTER — Inpatient Hospital Stay: Admit: 2022-12-19 | Discharge: 2022-12-19 | Payer: MEDICARE | Attending: Emergency Medicine

## 2022-12-19 ENCOUNTER — Encounter: Admit: 2022-12-19 | Payer: PRIVATE HEALTH INSURANCE | Attending: Emergency Medicine | Primary: Internal Medicine

## 2022-12-19 DIAGNOSIS — R1310 Dysphagia, unspecified: Secondary | ICD-10-CM

## 2022-12-19 DIAGNOSIS — W44F3XA Food entering into or through a natural orifice, initial encounter: Secondary | ICD-10-CM

## 2022-12-19 DIAGNOSIS — T18128A Food in esophagus causing other injury, initial encounter: Secondary | ICD-10-CM

## 2022-12-19 DIAGNOSIS — Z951 Presence of aortocoronary bypass graft: Secondary | ICD-10-CM

## 2022-12-19 DIAGNOSIS — J439 Emphysema, unspecified: Secondary | ICD-10-CM

## 2022-12-19 DIAGNOSIS — E785 Hyperlipidemia, unspecified: Secondary | ICD-10-CM

## 2022-12-19 DIAGNOSIS — I493 Ventricular premature depolarization: Secondary | ICD-10-CM

## 2022-12-19 DIAGNOSIS — I2510 Atherosclerotic heart disease of native coronary artery without angina pectoris: Secondary | ICD-10-CM

## 2022-12-19 DIAGNOSIS — I10 Essential (primary) hypertension: Secondary | ICD-10-CM

## 2022-12-19 DIAGNOSIS — U071 COVID-19: Secondary | ICD-10-CM

## 2022-12-19 LAB — COMPREHENSIVE METABOLIC PANEL
BKR A/G RATIO: 1 (ref 1.0–2.2)
BKR ALANINE AMINOTRANSFERASE (ALT): 19 U/L (ref 14–63)
BKR ALBUMIN: 3.6 g/dL (ref 3.4–5.0)
BKR ALKALINE PHOSPHATASE: 72 U/L (ref 46–116)
BKR ANION GAP: 12 (ref 5–16)
BKR ASPARTATE AMINOTRANSFERASE (AST): 26 U/L (ref 10–42)
BKR AST/ALT RATIO: 1.4
BKR BILIRUBIN TOTAL: 1 mg/dL (ref 0.2–1.2)
BKR BLOOD UREA NITROGEN: 19 mg/dL — ABNORMAL HIGH (ref 7–18)
BKR BUN / CREAT RATIO: 12.8 (ref 8.0–23.0)
BKR CALCIUM: 8.8 mg/dL (ref 8.5–10.5)
BKR CHLORIDE: 104 mmol/L (ref 98–107)
BKR CO2: 26 mmol/L (ref 21–32)
BKR CREATININE: 1.49 mg/dL — ABNORMAL HIGH (ref 0.80–1.30)
BKR EGFR, CREATININE (CKD-EPI 2021): 47 mL/min/{1.73_m2} — ABNORMAL LOW (ref >=60)
BKR GLOBULIN: 3.5 g/dL (ref 2.3–3.5)
BKR GLUCOSE: 105 mg/dL — ABNORMAL HIGH (ref 70–100)
BKR POTASSIUM: 3.8 mmol/L (ref 3.5–5.1)
BKR PROTEIN TOTAL: 7.1 g/dL (ref 6.0–8.0)
BKR SODIUM: 142 mmol/L (ref 136–145)

## 2022-12-19 LAB — CBC WITH AUTO DIFFERENTIAL
BKR WAM ABSOLUTE IMMATURE GRANULOCYTES.: 0.03 x 1000/ÂµL (ref 0.00–0.30)
BKR WAM ABSOLUTE LYMPHOCYTE COUNT.: 1.88 x 1000/ÂµL (ref 0.60–3.70)
BKR WAM ABSOLUTE NRBC (2 DEC): 0 x 1000/ÂµL (ref 0.00–1.00)
BKR WAM ANC (ABSOLUTE NEUTROPHIL COUNT): 6.03 x 1000/ÂµL (ref 2.00–7.60)
BKR WAM BASOPHIL ABSOLUTE COUNT.: 0.09 x 1000/ÂµL (ref 0.00–1.00)
BKR WAM BASOPHILS: 1 % (ref 0.0–1.4)
BKR WAM EOSINOPHIL ABSOLUTE COUNT.: 0.11 x 1000/ÂµL (ref 0.00–1.00)
BKR WAM EOSINOPHILS: 1.2 % (ref 0.0–5.0)
BKR WAM HEMATOCRIT (2 DEC): 46.3 % (ref 38.50–50.00)
BKR WAM HEMOGLOBIN: 16.1 g/dL (ref 13.2–17.1)
BKR WAM IMMATURE GRANULOCYTES: 0.3 % (ref 0.0–1.0)
BKR WAM LYMPHOCYTES: 20.2 % (ref 17.0–50.0)
BKR WAM MCH (PG): 35.2 pg — ABNORMAL HIGH (ref 27.0–33.0)
BKR WAM MCHC: 34.8 g/dL (ref 31.0–36.0)
BKR WAM MCV: 101.1 fL — ABNORMAL HIGH (ref 80.0–100.0)
BKR WAM MONOCYTE ABSOLUTE COUNT.: 1.17 x 1000/ÂµL — ABNORMAL HIGH (ref 0.00–1.00)
BKR WAM MONOCYTES: 12.6 % — ABNORMAL HIGH (ref 4.0–12.0)
BKR WAM MPV: 11.4 fL (ref 8.0–12.0)
BKR WAM NEUTROPHILS: 64.7 % (ref 39.0–72.0)
BKR WAM NUCLEATED RED BLOOD CELLS: 0 % (ref 0.0–1.0)
BKR WAM PLATELETS: 159 x1000/ÂµL (ref 150–420)
BKR WAM RDW-CV: 13.2 % (ref 11.0–15.0)
BKR WAM RED BLOOD CELL COUNT.: 4.58 M/ÂµL (ref 4.00–6.00)
BKR WAM WHITE BLOOD CELL COUNT: 9.3 x1000/ÂµL (ref 4.0–11.0)

## 2022-12-19 MED ORDER — PANTOPRAZOLE IV PUSH 40 MG VIAL & NS (ADULTS)
Freq: Once | INTRAVENOUS | Status: CP
Start: 2022-12-19 — End: ?
  Administered 2022-12-20: 04:00:00 20.000 mL via INTRAVENOUS

## 2022-12-19 MED ORDER — PANTOPRAZOLE 40 MG TABLET,DELAYED RELEASE
40 mg | Freq: Every day | ORAL | Status: DC
Start: 2022-12-19 — End: 2022-12-20

## 2022-12-19 MED ORDER — PANTOPRAZOLE IV PUSH 40 MG VIAL & NS (ADULTS)
Freq: Two times a day (BID) | INTRAVENOUS | Status: DC
Start: 2022-12-19 — End: 2022-12-20

## 2022-12-19 NOTE — ED Notes
 4:19 PM Provider at bedside. 4:26 PM Pt to ED c/o of difficulty swallowing. Pt reports increased difficulty in the last few days and was unable to take medications today. Pt was seen in the ED about 8 months ago for a similar issue. Pt reports the discomfort is in the esophagus. Pt denies difficulty breathing, chest pain, nausea, headache, diarrhea. Pt is A&Ox4 with even chest rise and unlabored respirations. Will continue to monitor and await further orders. 4:49 PM Pt to x-ray. 5:03 PM PIV access established. Lab work collected as ordered. Pt offers no further complaints at this time. POC transfer to [facility] for higher level of care. 5:28 PM Transportation to [place] booked. 5:50 PM Pt resting on chair with even chest rise and unlabored respirations. Pt offers no complaints at this time. Awaiting EMS transport to [place]. 5:56 PM Report given to [facility] ED Triage RN. 6:50 PM Report given to [facility] EMS crew.

## 2022-12-20 ENCOUNTER — Inpatient Hospital Stay: Admit: 2022-12-20 | Payer: MEDICARE | Attending: Student in an Organized Health Care Education/Training Program

## 2022-12-20 ENCOUNTER — Inpatient Hospital Stay: Admit: 2022-12-20 | Payer: MEDICARE

## 2022-12-20 ENCOUNTER — Encounter: Admit: 2022-12-20 | Payer: PRIVATE HEALTH INSURANCE | Attending: Internal Medicine | Primary: Internal Medicine

## 2022-12-20 DIAGNOSIS — I10 Essential (primary) hypertension: Secondary | ICD-10-CM

## 2022-12-20 DIAGNOSIS — I493 Ventricular premature depolarization: Secondary | ICD-10-CM

## 2022-12-20 DIAGNOSIS — R1310 Dysphagia, unspecified: Secondary | ICD-10-CM

## 2022-12-20 DIAGNOSIS — W44F3XA Food entering into or through a natural orifice, initial encounter: Secondary | ICD-10-CM

## 2022-12-20 DIAGNOSIS — E785 Hyperlipidemia, unspecified: Secondary | ICD-10-CM

## 2022-12-20 DIAGNOSIS — U071 COVID-19: Secondary | ICD-10-CM

## 2022-12-20 DIAGNOSIS — J439 Emphysema, unspecified: Secondary | ICD-10-CM

## 2022-12-20 DIAGNOSIS — I2510 Atherosclerotic heart disease of native coronary artery without angina pectoris: Secondary | ICD-10-CM

## 2022-12-20 DIAGNOSIS — Z951 Presence of aortocoronary bypass graft: Secondary | ICD-10-CM

## 2022-12-20 LAB — BASIC METABOLIC PANEL
BKR ANION GAP: 11 (ref 7–17)
BKR BLOOD UREA NITROGEN: 20 mg/dL (ref 8–23)
BKR BUN / CREAT RATIO: 15.2 (ref 8.0–23.0)
BKR CALCIUM: 8.9 mg/dL (ref 8.8–10.2)
BKR CHLORIDE: 109 mmol/L — ABNORMAL HIGH (ref 98–107)
BKR CO2: 23 mmol/L (ref 20–30)
BKR CREATININE: 1.32 mg/dL — ABNORMAL HIGH (ref 0.40–1.30)
BKR EGFR, CREATININE (CKD-EPI 2021): 54 mL/min/{1.73_m2} — ABNORMAL LOW (ref >=60)
BKR GLUCOSE: 90 mg/dL (ref 70–100)
BKR SODIUM: 143 mmol/L (ref 136–144)

## 2022-12-20 LAB — CBC WITH AUTO DIFFERENTIAL
BKR WAM ABSOLUTE IMMATURE GRANULOCYTES.: 0.03 x 1000/ÂµL (ref 0.00–0.30)
BKR WAM ABSOLUTE LYMPHOCYTE COUNT.: 1.92 x 1000/ÂµL (ref 0.60–3.70)
BKR WAM ABSOLUTE NRBC (2 DEC): 0 x 1000/ÂµL (ref 0.00–1.00)
BKR WAM ANC (ABSOLUTE NEUTROPHIL COUNT): 5.66 x 1000/ÂµL (ref 2.00–7.60)
BKR WAM BASOPHIL ABSOLUTE COUNT.: 0.07 x 1000/ÂµL (ref 0.00–1.00)
BKR WAM BASOPHILS: 0.8 % (ref 0.0–1.4)
BKR WAM EOSINOPHIL ABSOLUTE COUNT.: 0.16 x 1000/ÂµL (ref 0.00–1.00)
BKR WAM EOSINOPHILS: 1.8 % (ref 0.0–5.0)
BKR WAM HEMATOCRIT (2 DEC): 43.8 % (ref 38.50–50.00)
BKR WAM HEMOGLOBIN: 14.9 g/dL (ref 13.2–17.1)
BKR WAM IMMATURE GRANULOCYTES: 0.3 % (ref 0.0–1.0)
BKR WAM LYMPHOCYTES: 21.1 % (ref 17.0–50.0)
BKR WAM MCH (PG): 33.9 pg — ABNORMAL HIGH (ref 27.0–33.0)
BKR WAM MCHC: 34 g/dL (ref 31.0–36.0)
BKR WAM MCV: 99.8 fL (ref 80.0–100.0)
BKR WAM MONOCYTE ABSOLUTE COUNT.: 1.27 x 1000/ÂµL — ABNORMAL HIGH (ref 0.00–1.00)
BKR WAM MONOCYTES: 13.9 % — ABNORMAL HIGH (ref 4.0–12.0)
BKR WAM MPV: 11.4 fL (ref 8.0–12.0)
BKR WAM NEUTROPHILS: 62.1 % (ref 39.0–72.0)
BKR WAM NUCLEATED RED BLOOD CELLS: 0 % (ref 0.0–1.0)
BKR WAM PLATELETS: 196 x1000/ÂµL (ref 150–420)
BKR WAM RDW-CV: 13 % (ref 11.0–15.0)
BKR WAM RED BLOOD CELL COUNT.: 4.39 M/ÂµL (ref 4.00–6.00)
BKR WAM WHITE BLOOD CELL COUNT: 9.1 x1000/ÂµL (ref 4.0–11.0)

## 2022-12-20 LAB — PT/INR AND PTT (BH GH L LMW YH)
BKR INR: 1.02 (ref 0.86–1.13)
BKR PARTIAL THROMBOPLASTIN TIME: 25.5 s (ref 23.0–31.0)
BKR PROTHROMBIN TIME: 11.2 s (ref 9.6–12.3)

## 2022-12-20 LAB — MAGNESIUM: BKR MAGNESIUM: 1.9 mg/dL (ref 1.7–2.4)

## 2022-12-20 MED ORDER — FENTANYL (PF) 50 MCG/ML INJECTION SOLUTION
50 mcg/mL | INTRAVENOUS | Status: DC | PRN
Start: 2022-12-20 — End: 2022-12-20
  Administered 2022-12-20: 23:00:00 50 mcg/mL via INTRAVENOUS

## 2022-12-20 MED ORDER — ROCURONIUM 10 MG/ML INTRAVENOUS SOLUTION
10 mg/mL | INTRAVENOUS | Status: DC | PRN
Start: 2022-12-20 — End: 2022-12-20
  Administered 2022-12-20: 23:00:00 10 mg/mL via INTRAVENOUS

## 2022-12-20 MED ORDER — SODIUM CHLORIDE 0.9 % (FLUSH) INJECTION SYRINGE
0.9 % | INTRAVENOUS | Status: DC | PRN
Start: 2022-12-20 — End: 2022-12-22
  Administered 2022-12-20: 11:00:00 0.9 mL via INTRAVENOUS

## 2022-12-20 MED ORDER — MONTELUKAST 10 MG TABLET
10 mg | Freq: Every evening | ORAL | Status: DC
Start: 2022-12-20 — End: 2022-12-22
  Administered 2022-12-21 – 2022-12-22 (×2): 10 mg via ORAL

## 2022-12-20 MED ORDER — HEPARIN (PORCINE) 5,000 UNIT/ML INJECTION SOLUTION
5000 unit/mL | Freq: Three times a day (TID) | SUBCUTANEOUS | Status: DC
Start: 2022-12-20 — End: 2022-12-22
  Administered 2022-12-21 – 2022-12-22 (×3): 5000 mL via SUBCUTANEOUS

## 2022-12-20 MED ORDER — ONDANSETRON HCL (PF) 4 MG/2 ML INJECTION SOLUTION
42 mg/2 mL | INTRAVENOUS | Status: DC | PRN
Start: 2022-12-20 — End: 2022-12-20
  Administered 2022-12-20: 23:00:00 4 mg/2 mL via INTRAVENOUS

## 2022-12-20 MED ORDER — TIOTROPIUM 2.5 MCG-OLODATEROL 2.5 MCG/ACTUATION MIST FOR INHALATION
2.5-2.5 mcg/actuation | Freq: Every day | RESPIRATORY_TRACT | Status: DC
Start: 2022-12-20 — End: 2022-12-22
  Administered 2022-12-21 – 2022-12-22 (×2): via RESPIRATORY_TRACT

## 2022-12-20 MED ORDER — SUGAMMADEX 100 MG/ML INTRAVENOUS SOLUTION
100 mg/mL | INTRAVENOUS | Status: DC | PRN
Start: 2022-12-20 — End: 2022-12-20
  Administered 2022-12-20: 23:00:00 100 mg/mL via INTRAVENOUS

## 2022-12-20 MED ORDER — PROPOFOL 10 MG/ML INTRAVENOUS EMULSION
10 mg/mL | Status: DC | PRN
Start: 2022-12-20 — End: 2022-12-20
  Administered 2022-12-20: 23:00:00 10 mL/h

## 2022-12-20 MED ORDER — SUCCINYLCHOLINE CHLOR 100 MG/5ML (20 MG/ML) IV SYRINGE (WRAPPED ERX)
100520 mg/5 mL (20 mg/mL) | INTRAVENOUS | Status: DC | PRN
Start: 2022-12-20 — End: 2022-12-20
  Administered 2022-12-20: 23:00:00 100 mg/5 mL (20 mg/mL) via INTRAVENOUS

## 2022-12-20 MED ORDER — ROCURONIUM 10 MG/ML INTRAVENOUS SOLUTION
10 | Freq: Once | INTRAVENOUS | Status: DC
Start: 2022-12-20 — End: 2022-12-20

## 2022-12-20 MED ORDER — ALBUTEROL SULFATE HFA 90 MCG/ACTUATION AEROSOL INHALER
90 mcg/actuation | Freq: Four times a day (QID) | RESPIRATORY_TRACT | Status: DC | PRN
Start: 2022-12-20 — End: 2022-12-22

## 2022-12-20 MED ORDER — PROPOFOL 10 MG/ML INTRAVENOUS EMULSION
10 mg/mL | INTRAVENOUS | Status: DC | PRN
Start: 2022-12-20 — End: 2022-12-20
  Administered 2022-12-20: 23:00:00 10 mg/mL via INTRAVENOUS

## 2022-12-20 MED ORDER — ETHYL ALCOHOL 62 % TOPICAL SWAB
62 % | Freq: Two times a day (BID) | NASAL | Status: DC
Start: 2022-12-20 — End: 2022-12-22
  Administered 2022-12-20 – 2022-12-22 (×5): 62 % via NASAL

## 2022-12-20 MED ORDER — PANTOPRAZOLE 40 MG TABLET,DELAYED RELEASE
40 mg | Freq: Two times a day (BID) | ORAL | Status: DC
Start: 2022-12-20 — End: 2022-12-21
  Administered 2022-12-21 (×2): 40 mg via ORAL

## 2022-12-20 MED ORDER — PROPOFOL 1,000 MG/100 ML IV INFUSION
1000 | INTRAVENOUS | Status: DC
Start: 2022-12-20 — End: 2022-12-20

## 2022-12-20 MED ORDER — KETAMINE 50 MG/ML (1 ML) INTRAVENOUS SYRINGE
50 | Freq: Once | INTRAVENOUS | Status: DC
Start: 2022-12-20 — End: 2022-12-20

## 2022-12-20 MED ORDER — PHENYLEPHRINE 1 MG/10 ML (100 MCG/ML) IN 0.9 % SOD.CHLORIDE IV SYRINGE
110100 mg/0 mL (00 mcg/mL) | INTRAVENOUS | Status: DC | PRN
Start: 2022-12-20 — End: 2022-12-20
  Administered 2022-12-20 (×2): 1 mg/0 mL (00 mcg/mL) via INTRAVENOUS

## 2022-12-20 MED ORDER — FENTANYL (PF) 50 MCG/ML INJECTION SOLUTION
50 | Status: CP
Start: 2022-12-20 — End: ?

## 2022-12-20 MED ORDER — LIDOCAINE (PF) 20 MG/ML (2 %) INJECTION SOLUTION
202 mg/mL (2 %) | INTRAVENOUS | Status: DC | PRN
Start: 2022-12-20 — End: 2022-12-20
  Administered 2022-12-20: 23:00:00 20 mg/mL (2 %) via INTRAVENOUS

## 2022-12-20 MED ORDER — SODIUM CHLORIDE 0.9 % (FLUSH) INJECTION SYRINGE
0.9 % | Freq: Three times a day (TID) | INTRAVENOUS | Status: DC
Start: 2022-12-20 — End: 2022-12-22
  Administered 2022-12-21 (×3): 0.9 mL via INTRAVENOUS

## 2022-12-20 MED ORDER — ASPIRIN 81 MG TABLET,DELAYED RELEASE
81 mg | Freq: Every day | ORAL | Status: DC
Start: 2022-12-20 — End: 2022-12-22
  Administered 2022-12-21 – 2022-12-22 (×2): 81 mg via ORAL

## 2022-12-20 MED ORDER — SUGAMMADEX 100 MG/ML INTRAVENOUS SOLUTION
100 | Status: CP
Start: 2022-12-20 — End: ?

## 2022-12-20 NOTE — Other
 Post Anesthesia Transfer of Care NotePatient: Raymond FlemingProcedure(s) Performed: Procedure(s) (LRB):UPPER GI ENDOSCOPY; W/BALLOON DILATION, (< 30 MM DIAMETER) (N/A)Last Vitals: I have reviewed the post-operative vital signs during the handoff as noted in the Epic chart.POSTOP HANDOFF :      Patient Location:  PACU     Level of Consciousness:  Awake, alert and oriented     Oxygen source: maskPatient co-morbidities, intra-operative course, intake & output and antibiotics as per Anesthesia record were discussed with the RN.

## 2022-12-20 NOTE — Anesthesia Post-Procedure Evaluation
 Anesthesia Post-op NotePatient: Raymond FlemingProcedure(s):  Procedure(s) (LRB):UPPER GI ENDOSCOPY; W/BALLOON DILATION, (< 30 MM DIAMETER) (N/A) Last Vitals:  I have reviewed the post-operative vital signs as noted in the Epic chart.POSTOP EVALUATION:      Patient Recovery Location:  PACU     Vital Signs Status:  Stable     Patient Participation:  Patient participated     Mental Status:  Awake, alert and oriented     Respiratory Status:  Acceptable     Airway Patency:  Patent     Cardiovascular/Hydration Status:  Stable     Pain Management:  Satisfactory to patient     Nausea/Vomiting Status:  Satisfactory to patientThere were no known notable events for this encounter.

## 2022-12-20 NOTE — Progress Notes
??  I examined Raymond Gardner reviewed labs and imaging with MICU team. Together, we formulated a comprehensive assessment and treatment plan, which is documented in detail in today?s notes. I agree with the assessment and plan with the following exceptions and additions.Raymond Gardner is a 82 y.o. male with a PMH significant for esophageal stenosis, Barrett's esophagus, CAD s/p CABG who presented with esophageal food/ pill impaction due to severe esophageal stenosis. Events yesterday / overnight:No acute events On 2 L NC with SpO2 0.96Not on vasopressor support.Investigative and management plan for Raymond Gardner: Gastroenterology. For EGD and dilatation with GI in endo suite today.Raymond Gardner, MD11/09/2022 This patient is not critically ill.Total time spent with patient: 30 Minutes

## 2022-12-20 NOTE — Plan of Care
 Problem: Adult Inpatient Plan of CareGoal: Readiness for Transition of CareOutcome: Interventions implemented as appropriate Plan of Care Overview/ Patient Status    Case Management Screening and Evaluation  Flowsheet Row Most Recent Value Case Management Screening: Chart review completed. If YES to any question below then proceed to CM Eval/Plan  Is there a change in their cognitive function No Do you anticipate that the pt will have any discharge needs requiring CM intervention? Yes Has there been an unscheduled readmission within the last 30 days and/or four (4) encounters (encounters include: ED, OBS, Inpatient) within the last six (6) months? No Were there services prior to admission ( Examples: Assisted Living, HD, Homecare, Extended Care Facility, Methadone, SNF, Outpatient Infusion Center) No Negative/Positive Screen Positive Screening: Complete CM Evaluation and Plan Case manager will take lead to arrange post-acute care services and continue to work with the care team as the patient progresses towards discharge Yes Case Manager Attestation  I have reviewed the medical record and completed the above screen. CM staff will follow patient's progress and discuss the plan of care with the Treatment Team. Yes Case Management Evaluation and Plan  Arrived from prior to admission home/apartment/condo Do you have a caregiver, or do you anticipate the need for a caregiver given the change in your physicial function? No  Lives With Child(ren), Adult, Other Relative(s) (Specify)  [lives w/ son and daughter in law and grandchildren] Services Prior to Admission none Patient Requires Care Coordination Intervention Due To change in physical function Prior to Hospitalization: Assistance Needed/DME being used None Documented Insurance Accurate Yes Any financial concerns related to anticipated discharge needs No Patient's home address verified Yes Patient's PCP of record verified Yes Source of Clinical History  Patient's clinical history has been reviewed and source of Information is: Medical record, Child(ren)  [son [name]] Case Psychologist, occupational  I have reviewed the medical record and completed the above evaluation with the following recommendations. Yes Discharge Planning Coordination Recommendations  Discharge Planning Coordination Recommendations Needs not determined at this time Case Manager reviewed plan of care/ continuum of care need's with  Patient, Transitional care rounds, Interdisciplinary Team   Case Management Plan  Flowsheet Row Most Recent Value Discharge Planning  Patient/Patient Representative goals/treatment preferences for discharge are:  return home pending progess Mode of Transportation  Private car  (add comment for special considerations) Patient accompanied by family member   Chart reviewed:  82 y.o. male with severe esophageal stenosis, Barrett's esophagus, CAD s/p CABG on DAPT, COPD who presented with esophageal food/ pill impaction due to severe stenosis. Transferred to MICU for closer monitoring of airway.CM spoke with son [name] over the phone and explained the role of CM. He resides with son [name] ,daughter in law and grandchildren. He is independent with ADLs and ambulation w/o AD, has a cane and RW if needed. Hx [facility] and [facility] for STR in the past. Case Management will take lead to arrange post -acute care services and continue to work with the care team as the patient progresses towards discharge. Travonna Swindle TA-AOKI RN, Printmaker  (NP3 and NP9 MICU )

## 2022-12-20 NOTE — Procedures
 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 No mass lesion noted. - Likely food bolus impaction with food passage into  the stomach with the endoscope. - Dilation not performed as patient reported being on two antiplatelet agents- No specimens collected. * No implants in log *      Prior to Admission Medications Medication Name Sig Taking? Patient Reported   albuterol sulfate 90 mcg/actuation HFA aerosol inhalerLast dose:  --  Inhale 1 puff into the lungs every 6 (six) hours as needed.   Yes   aspirin 81 mg EC delayed release tabletLast dose:  --  every 24 hours.   Yes   ezetimibe (ZETIA) 10 mg tabletLast dose:  --  Take 1 tablet (10 mg total) by mouth daily.       gabapentin (NEURONTIN) 100 mg capsuleLast dose:  --  Take 1 capsule (100 mg total) by mouth at bedtime.   Yes   magnesium oxide (MAG-OX) 400 mg (241.3 mg magnesium) tabletLast dose:  --  Take 1 tablet (400 mg total) by mouth daily.       montelukast (SINGULAIR) 10 mg tabletLast dose:  --  TAKE 1 TABLET(10 MG) BY MOUTH EVERY NIGHT       omeprazole (PRILOSEC) 20 mg capsuleLast dose:  --  Take 1 capsule (20 mg total) by mouth 2 (two) times daily.       oxyCODONE (ROXICODONE) 5 mg Immediate Release tabletLast dose:  --  Take 1 tablet (5 mg total) by mouth every 6 (six) hours as needed for pain.   Yes   polyethylene glycol (MIRALAX) 17 gram packetLast dose:  --  Take 1 packet (17 g total) by mouth daily. Mix in 8 ounces of water, juice, soda, coffee or tea prior to taking.       rosuvastatin (CRESTOR) 40 mg tabletLast dose:  --  Take 1 tablet (40 mg total) by mouth daily. senna (SENOKOT) 8.6 mg tabletLast dose:  --  Take 1 tablet (8.6 mg total) by mouth nightly.       STIOLTO RESPIMAT 2.5-2.5 mcg/actuation mist for inhalationLast dose:  --  INHALE 2 INHALATION INTO THE LUNGS DAILY       valsartan (DIOVAN) 80 mg tabletLast dose:  --  TAKE 1 TABLET(80 MG) BY MOUTH DAILY       Prior to admission medications last reviewed by Eldridge Dace, MD on Thu Dec 19, 2022 1609 Current Facility-Administered Medications Medication Dose Route Frequency Provider Last Rate Last Admin  [Held by provider] heparin (PORCINE) injection 5,000 Units  5,000 Units Subcutaneous Q8H Um, Hyo-Bin Marylene Land), MD     Recommendations:Diet Clear liquid diet tonight, can advance to full liquids in AM, patient to remain on full liquid diet until the time of next EGD and dilationPPI useOmeprazole 40 mg twice daily for 3 months and will likely need this dose indefinitely Systemic anticoagulation/antiplatelets- Resume antiplatelets/anticoagulants as clinically indicatedAntibiotic useN/AOther Recommendations- Return patient to hospital ward - Repeat upper endoscopy in 2 weeks for retreatment. We will need to be off Plavix medication for at least 7 days prior to next endoscopy at which time dilation can be pursued. outpatient RN coordinators  notified via staff message in [place].  Call [phone] for appointments  Final procedure note will be available in Epic under Chart Review -> Procedures? tab once completed by attending with finalized findings and recommendations.Raelene Bott Susana Gripp, MD

## 2022-12-20 NOTE — Anesthesia Pre-Procedure Evaluation
 This is a 82 y.o. male scheduled for UPPER GI ENDOSCOPY; DX, W/WO SPECIMEN COLLECTION, BRUSHING/WASHING (SEP PROC).Review of Systems/ Medical History Patient summary, nursing notes, pre-procedure vitals, height, weight and NPO status reviewed.Anesthesia Evaluation:   Estimated body mass index.12/19/22 : 23.71 kg/m? Last patient weight recorded. 12/19/22 : 77.1 kg Last patient height recorded. 12/19/22 : 5' 11 (1.803 m) CC/HPI: 82 y/o man presenting for urgent upper GI endoscopy with cf/ pill impaction. Past Medical History:No date: Coronary artery disease12/29/2023: COVID-19No date: DysphagiaNo date: Essential hypertensionNo date: Hx of CABGNo date: HyperlipidemiaNo date: Premature ventricular contractionsNo date: Pulmonary emphysema [facility] Code)Past Surgical History:  Past Surgical History:2010: CORONARY ARTERY BYPASS GRAFTCardiovascular: Patient has a history of: hypertension.   -Exercise tolerance: >4 METS -Coronary Artery Disease: CAD, CABG (CABG in 2010) and cardiac stents -Valvular disease: history of valvular problems/murmurs aortic stenosis -Vascular Disease:   carotid artery disease and aortic disease.   -Other Cardiovascular:   Echo 03/14/22~ * Normal left ventricular size, wall thickness, systolic function and wall motion. LVEF calculated by biplane Simpson's was 58%.  Normal diastolic function and filling pressures.~ * Normal right ventricular cavity size and systolic function.~ * Moderate aortic valve calcification.  Mild aortic stenosis.  Peak aortic velocity is 2.5 m/s with a calculated peak aortic gradient of 26 mmHg.  Mean gradient is 13 mmHg.  Aortic valve area is 1.4 cm2.  Dimensionless index is 0.44.  Mild aortic regurgitation.~ * Normal mitral valve leaflets.  Mild posterior mitral annular calcification.  Trace mitral regurgitation.~ * Mild tricuspid regurgitation with normal right heart pressures.~ * No significant pericardial effusion.~ * No prior study available for comparison.Cardiology note 1/30/24Cardiology message 03/25/22 There is no problem doing an endoscopy while the patient has a Holter monitor on.  It was ordered as a 3 day monitor, so it should be finished by 2/14 anyway. Marland Kitchen Respiratory:  Patient has shortness of breath.     -Nicotine Dependence: Nicotine dependence: former smoker.-Lung Disorders: Patient has emphysema.       -COPD:  yes-Comments: [place] chest 12/28/23Impression No evidence of pulmonary embolism or acute thoracic pathology.Large hiatal hernia with intrathoracic stomach and containing nonobstructed portions of the colon.[place] [facility] Notify System Classification: Routine.Neuromuscular: -Muscle disorders: neuromuscular disease Gastrointestinal/Genitourinary: -Gastrointestinal Disorders:  Patient has GERD.Hematological/Lymphatic: -Coagulopathy:  Patient has blood dyscrasia. He has thrombocytopenia.Endocrine/Metabolic: -Thyroid Disorders:  Patient has hypothyroidism.Behavioral/Social/Psychiatric & Syndromes:  Patient has depression.Additional Findings: VITAL SIGNS:Temp:  [36.2 ?C-36.4 ?C] 36.2 ?CPulse:  [64-75] 64Resp:  [16-20] 16BP: (100-147)/(67-82) 100/67SpO2:  [94 %-97 %] 94 %Device (Oxygen Therapy): room airLABS:Lab Results     Component                Value               Date                     WBC                      9.3                 12/19/2022               HGB                      16.1                12/19/2022  HCT                      46.30               12/19/2022               PLT                      159                 12/19/2022               CHOL                     176                 02/15/2021               TRIG                     71                  02/15/2021               HDL                      77                  02/15/2021               LDL 86                  02/15/2021               ALT                      19                  12/19/2022               AST                      26                  12/19/2022               NA                       142                 12/19/2022               K                        3.8                 12/19/2022               CL                       104                 12/19/2022               CREATININE               1.49 (H)  12/19/2022               BUN                      19 (H)              12/19/2022               CO2                      26                  12/19/2022               INR                      1.08 (H)            06/19/2022               GLU                      105 (H)             12/19/2022          ECG: sinus rhythm ECHO:5/24Left Ventricle: Left ventricular ejection fraction, by estimation, is 55 to 60%. The left ventricle has normal function. The left ventricle has no regional wall motion abnormalities. The left ventricular internal cavity size was normal in size. There is  no left ventricular hypertrophy. Left ventricular diastolic function could not be evaluated due to mitral annular calcification (moderate or greater). Left ventricular diastolic function could not be evaluated. Right Ventricle: The right ventricular size is normal. No increase in right ventricular wall thickness. Right ventricular systolic function is normal. There is mildly elevated pulmonary artery systolic pressure. The tricuspid regurgitant velocity is 2.99  m/s, and with an assumed right atrial pressure of 3 mmHg, the estimated right ventricular systolic pressure is 38.8 mmHg. Left Atrium: Left atrial size was normal in size. Right Atrium: Right atrial size was normal in size. Pericardium: There is no evidence of pericardial effusion. Mitral Valve: The mitral valve is degenerative in appearance. Moderate mitral annular calcification. No evidence of mitral valve regurgitation. No evidence of mitral valve stenosis. Tricuspid Valve: The tricuspid valve is normal in structure. Tricuspid valve regurgitation is moderate . No evidence of tricuspid stenosis. Aortic Valve: 2D AVA 1.88 cm2. 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 The aortic valve is calcified. Aortic valve regurgitation is mild. Aortic regurgitation PHT measures 400 msec. Aortic valve mean gradient measures 16.0 mmHg. Aortic valve peak gradient measures 24.3 mmHg. Aortic valve area, by VTI measures 0.79 cm?. Pulmonic Valve: The pulmonic valve was not well visualized. Pulmonic valve regurgitation is mild. No evidence of pulmonic stenosis. Aorta: The aortic root and ascending aorta are structurally normal, with no evidence of dilitation  Anesthesia PlanASA 3 - emergent

## 2022-12-20 NOTE — Other
 [place] Gastroenterology Consult NoteReason for Consult: Concern for pill impactionEvaluation requested by: Attending Provider: Howell Pringle, MD [phone] Presenting History: Raymond Gardner is a 82 y.o. male with PMH notable for dysphagia with esophageal stenosis, Barrett's esophagus, CAD on aspirin, COPD, who was transferred from [facility] for concern for esophageal impaction.Patient reports he has had progressively worsening dysphagia the last 4 days. Around 8am on 11/7, he tried to take his medications but was unable to swallow them. He felt they were stuck in his throat and regurgitated them. Initially he was able to manage his own secretions but had regurgitation with drinking even small amounts of water. Notes he had some regurgitation with drinking fluids the day prior as well but not this severe. On my evaluation, patient had regurgitation with drinking even small amount of water accompanied by retrosternal discomfort. Initially he appeared to be able to manage his own secretions but when I re-evaluated him a few hours later, he endorsed he was starting to spit up his saliva more frequently.Last EGD done in 03/2022 for dysphagia showed multiple esophageal stenoses with the narrowest measuring 8mm requiring XP nasogastric scope to be traversed and long-segment Barrett's esophagus. Repeat upper endoscopy with dilation recommended but never completed.Medical History: Past Medical History: Diagnosis Date  Coronary artery disease   COVID-19 02/08/2022  Dysphagia   Essential hypertension   Hx of CABG   Hyperlipidemia   Premature ventricular contractions   Pulmonary emphysema (HC Code)  No past medical history pertinent negatives. Family History Problem Relation Age of Onset  COPD Brother   Coronary Artery Disease Brother   Past Surgical History: Procedure Laterality Date  CORONARY ARTERY BYPASS GRAFT  2010 No past surgical history pertinent negatives on file. Social History Tobacco Use  Smoking status: Former   Current packs/day: 0.00   Types: Cigarettes   Quit date: 1987   Years since quitting: 37.8  Smokeless tobacco: Never Vaping Use  Vaping Use: Never used Substance Use Topics  Alcohol use: Yes   Comment: 7 glasses of wine per week, 1 per day  Drug use: Never  Outpatient Medications:No current facility-administered medications on file prior to encounter. Current Outpatient Medications on File Prior to Encounter Medication Sig Dispense Refill  albuterol sulfate 90 mcg/actuation HFA aerosol inhaler Inhale 1 puff into the lungs every 6 (six) hours as needed.    aspirin 81 mg EC delayed release tablet every 24 hours.    ezetimibe (ZETIA) 10 mg tablet Take 1 tablet (10 mg total) by mouth daily. 90 tablet 4  gabapentin (NEURONTIN) 100 mg capsule Take 1 capsule (100 mg total) by mouth at bedtime.    magnesium oxide (MAG-OX) 400 mg (241.3 mg magnesium) tablet Take 1 tablet (400 mg total) by mouth daily. 90 tablet 3  montelukast (SINGULAIR) 10 mg tablet TAKE 1 TABLET(10 MG) BY MOUTH EVERY NIGHT 30 tablet 0  omeprazole (PRILOSEC) 20 mg capsule Take 1 capsule (20 mg total) by mouth 2 (two) times daily. 180 capsule 3  oxyCODONE (ROXICODONE) 5 mg Immediate Release tablet Take 1 tablet (5 mg total) by mouth every 6 (six) hours as needed for pain.    polyethylene glycol (MIRALAX) 17 gram packet Take 1 packet (17 g total) by mouth daily. Mix in 8 ounces of water, juice, soda, coffee or tea prior to taking. 14 each 2  rosuvastatin (CRESTOR) 40 mg tablet Take 1 tablet (40 mg total) by mouth daily. 90 tablet 3  senna (SENOKOT) 8.6 mg  tablet Take 1 tablet (8.6 mg total) by mouth nightly. 30 tablet 11  STIOLTO RESPIMAT 2.5-2.5 mcg/actuation mist for inhalation INHALE 2 INHALATION INTO THE LUNGS DAILY 4 g 11  valsartan (DIOVAN) 80 mg tablet TAKE 1 TABLET(80 MG) BY MOUTH DAILY 30 tablet 0 Objective: Vitals:Temp:  [97.5 ?F (36.4 ?C)-97.6 ?F (36.4 ?C)] 97.5 ?F (36.4 ?C)Pulse:  [70-75] 75Resp:  [18-20] 18BP: (125-147)/(77-82) 147/77SpO2:  [95 %-97 %] 95 %Device (Oxygen Therapy): room airPhysical Exam:GENERAL: Awake, cooperative, in no acute distressEYES: No conjunctival icterusRESPIRATORY: No respiratory distress on room airGASTROINTESTINAL: Soft, nondistended, nontenderSKIN: No jaundiceNEURO: Alert, answers questions appropriatelyMedications:SCHEDULED:No current facility-administered medications for this encounter. Labs:Metabolic/Lytes: Recent Labs Lab 11/07/241647 NA 142 K 3.8 CO2 26 CL 104 ANIONGAP 12 BUN 19* CREATININE 1.49* CALCIUM 8.8 CBC, Liver Chemistries, Coags: Recent Labs Lab 11/07/241647 WBC 9.3 HGB 16.1 PLT 159 BILITOT 1.0 ALBUMIN 3.6 AST 26 ALT 19 ALKPHOS 72 Glucose Trend: Recent Labs Lab 11/07/241647 GLU 105* Diagnostics:UPPER ENDOSCOPY (03/27/2022 10:01 AM) Impression & Recommendations: Raymond Gardner is a 82 y.o. male with PMH notable for dysphagia with esophageal stenosis, Barrett's esophagus, CAD on aspirin, COPD, who was transferred from [facility] for concern for esophageal impaction.Patient reports progressively worsening dysphagia over the last four days, culminating in an acute episode around 8am on 11/7 when he felt his medications stuck in his throat. He is now having difficulty managing his own secretions concerning for a partial pill impaction evolving into complete obstruction. Will plan for emergent EGD in the OR.Case discussed with attending Dr. Leeroy Gardner, final attestation to follow. Raymond Gardner, MDYale Gastroenterology and Hepatology FellowBest Contact: [facility]  [id number] after 5pm and weekends, please contact on-call fellow as listed on [place]

## 2022-12-20 NOTE — Other
 [facility] ED Observation Progress NoteAdmit to Observation Status: Obs date: 12/19/2022  8:40 PMCondition:Unchanged27 year old male with a history of CAD status post CABG, PVD, hypertension, hyperlipidemia, dysphagia with esophageal stenosis and Barretts esophagus, who presents as a transfer from [facility] for GI consultation for concern for pill impaction.  Reportedly he had progressively worsening dysphagia over the past 4 days with the sensation that he was unable to swallow his pills, causing him to cough them up.Vital signs: BP (!) 155/80  - Pulse 71  - Temp 98.1 ?F (36.7 ?C) (Oral)  - Resp 16  - SpO2 98% Vital signs reviewed per nursing notesGeneral:  Distress: Appears well, no distressHeart:  normal rateLungs:  Clear to auscultation bilaterally.  Normal respiratory effortNeuro: at baselineAbdomen: Soft, nontender YesSkin: Warm and well perfused YesPlan:- appreciate GI recs- GI for scope in AM- ensure that GI discharges from pacu; patient should NOT return to ED per policyResults: 2:13 AM Received follow up from GI, patient to go to OR at this time. Discussed with Dr. Tad Moore.Lily Lovings, PA-----------------------------------------------------Response to management: unchangedObservation course: transfer to OR with GII discussed the case with supervisee, agree with supervisee E/M note with corrections /additions as noted I did not personally see patient.  Transfer for GI evaluation in setting of subacute dysphagia, protecting airway managing secretions, pending endoscopy in the morning.Seward Speck, MD11/08/24 0009 Loletta Specter, PA11/08/24 0214

## 2022-12-20 NOTE — ED Notes
 7:27 PM  Received report from EMS regarding pt transfer from [facility]. Pt reports the feeling that food is stuck in his throat. I try to swallow things and it just comes back up. I was unable to take my meds today. Pt denies pain and is resting comfortably, A&Ox4, resp even and unlaboredChief Complaint Patient presents with  Foreign Body In Throat   Tx from [facility] ED to see Advanced GI for possible food bolus impaction. Sating in the mid to upper 90s. No resp distress noted.

## 2022-12-20 NOTE — Other
 Pt in pre-op area, surgery canceled. Upgraded to ICU for advanced GI procedure in AM. Vital signs stable.Pt a/ox4. Transferred to ICU with RN on monitor. All belongings sent with patient to ICU

## 2022-12-20 NOTE — ED Notes
 10:53 PM report received, patient awaiting GI scope for the AM. Past Medical History: Diagnosis Date  Coronary artery disease   COVID-19 02/08/2022  Dysphagia   Essential hypertension   Hx of CABG   Hyperlipidemia   Premature ventricular contractions   Pulmonary emphysema (HC Code)  Past Surgical History: Procedure Laterality Date  CORONARY ARTERY BYPASS GRAFT  2010 2:12 AM report given to OR for patient transfer to endo.

## 2022-12-20 NOTE — Other
 Operative Diagnosis:Pre-op:   * No pre-op diagnosis entered * Patient Coded Diagnosis   Pre-op diagnosis: Food impaction of esophagus, subsequent encounter  Post-op diagnosis: Food impaction of esophagus, subsequent encounter  Patient Diagnosis   None    Post-op diagnosis:   * Food impaction of esophagus, subsequent encounter [T18.128D, W44.F3XD]Operative Procedure(s) :Procedure(s) (LRB):UPPER GI ENDOSCOPY; W/BALLOON DILATION, (< 30 MM DIAMETER) (N/A)Post-op Procedure & Diagnosis ConfirmationPost-op Diagnosis: Post-op Diagnosis confirmed (no changes)Post-op Procedure: Post-op Procedure confirmed (no changes)

## 2022-12-20 NOTE — Progress Notes
 [facility] Advanced GI Progress NoteAttending Provider: Trina Ao, MD [phone] History: - Admitted to MICU- Tolerating secretions better than he was last night. Says that they are going down somewhat- Denies abdominal pain, nausea, vomitingObjective: Vitals:Temp:  [97 ?F (36.1 ?C)-98.1 ?F (36.7 ?C)] 97.3 ?F (36.3 ?C)Pulse:  [55-79] 56Resp:  [13-20] 13BP: (100-159)/(66-90) 129/66SpO2:  [92 %-98 %] 96 %Device (Oxygen Therapy): nasal cannulaO2 Flow (L/min):  [2] 2Physical Exam:GENERAL: Awake, cooperative, in no acute distress. Well nourished.HEENT: Normocephalic, atraumaticCARDIOVASCULAR: Regular rate and rhythm, no murmurs, no lower extremity edemaRESPIRATORY: No respiratory distress on RA, Clear to auscultation bilaterally GASTROINTESTINAL: Bowel Sounds Present, Soft, nondistended, nontender without guarding or rebound, no hepatosplenomegalySKIN: Warm and dry. No jaundice, no skin lesions on visualized skin NEURO: Alert and Oriented x 3, no focal deficitsMedications:SCHEDULED:Current Facility-Administered Medications Medication Dose Route Frequency Provider Last Rate Last Admin  ethyl alcohol 62 % nasal swab 1 Application  1 Application Nasal Q12H Um, Hyo-Bin Marylene Land), MD   1 Application at 12/20/22 0917  [Held by provider] heparin (PORCINE) injection 5,000 Units  5,000 Units Subcutaneous Q8H Um, Hyo-Bin Marylene Land), MD      sodium chloride 0.9 % flush 3 mL  3 mL IV Push Q8H Um, Hyo-Bin Marylene Land), MD     IEP:PIRJJO chlorideLabs:Recent Labs Lab 11/07/241647 11/08/240741 WBC 9.3 9.1 HGB 16.1 14.9 HCT 46.30 43.80 PLT 159 196  Recent Labs Lab 11/07/241647 11/08/240741 NEUTROPHILS 64.7 62.1  Recent Labs Lab 11/07/241647 11/08/240741 NA 142 143 K 3.8  --  CL 104 109* CO2 26 23 BUN 19* 20 CREATININE 1.49* 1.32* GLU 105* 90 ANIONGAP 12 11  Recent Labs Lab 11/07/241647 11/08/240741 CALCIUM 8.8 8.9 MG  --  1.9  Recent Labs Lab 11/07/241647 ALT 19 AST 26 ALKPHOS 72 BILITOT 1.0 PROT 7.1 ALBUMIN 3.6  Recent Labs Lab 11/08/240741 PTT 25.5 LABPROT 11.2 INR 1.02  No results found for: IRON, TIBC, FERRITIN Imaging:CXR 11/7/24Impression:Hazy obscuration of the left hemidiaphragm is noted likely representing atelectasis, however aspiration/pneumonia could appear similar. Procedural:UPPER ENDOSCOPY (03/27/2022 10:01 AM) Impression & Recommendations Raymond Gardner is a 82 y.o. male with PMH notable for dysphagia with esophageal stenosis, Barrett's esophagus, CAD on aspirin, COPD, who was transferred from [facility] for concern for esophageal impaction. Patient reports progressively worsening dysphagia for about a week, culminating in an acute episode around 8am on 11/7 when he felt his medications stuck in his throat. He is now having difficulty managing his own secretions concerning for a partial pill impaction evolving into complete obstruction.We will plan for EGD today with possible dilation pending endoscopic findings. Given his severe stenosis in the past, he may require fluoroscopy. Discussed with patient and he was agreeable with the plan.We will continue to follow.Case discussed with Dr. Rosanne Gutting, final attending attestation to follow. Please do not hesitate to contact our team with any questions or concernsLawrence Grant Ruts, MDYale Digestive DiseasesBest Contact: [facility] [phone] after 5pm and weekends, please contact on-call GI fellow as listed on [place]

## 2022-12-20 NOTE — OR Nursing
Sedation given by anesthesia. Bite block in for procedure. EGD performed. Scope passed to duodenum. No interventions. Pt tolerated procedure well. Pt sent to RR. Report given to RN in RR.

## 2022-12-20 NOTE — Plan of Care
 Admission Note Nursing Raymond Gardner is a 82 y.o. male admitted with a chief complaint of difficulty swallowing. Patient arrived from  Patient is    Vitals:  12/20/22 0156 12/20/22 0415 12/20/22 0437 12/20/22 0500 BP: (!) 155/80 (!) 159/90 (!) 144/82  Pulse: 71 72 60  Resp: 16 16 15   Temp: 98.1 ?F (36.7 ?C) 98 ?F (36.7 ?C) 97 ?F (36.1 ?C)  TempSrc: Oral Temporal Oral  SpO2: 98% 95% 94%  Weight:    72.2 kg Height:    5' 11 (1.803 m) Oxygen therapy Oxygen TherapySpO2: 94 %Device (Oxygen Therapy): nasal cannulaO2 Flow (L/min): 2I have reviewed the patient's current medication orders..I have reviewed patient valuables Belongings charted in last 7 days: Patient Valuables   Patient Valuables Flowsheet                    PATIENT VALUABLE(S)       Clothing Disposition At bedside/locker/closet 12/20/22 0227  Denture Disposition At bedside/locker/closet 12/20/22 0227   Cell phone disposition At bedside/locker/closet 12/20/22 0227     Comments: Please see notes belowSee flowsheets, patient education and plan of care for additional information. Plan of Care Overview/ Patient Status    Patient came from PACU - alert/oriented x 4; vital signs stable.. came to icu for EGD by [facility]. Patient is NPO for now and 2 peripheral IVs inserted.

## 2022-12-20 NOTE — Utilization Review (ED)
 UM Status: UR/MD agree on IP status, Dr. Leonette Nutting from [facility] for concern for esophageal impaction. Difficulty managing secretions. Taken to OR for emergent EGD. ICU admit.

## 2022-12-20 NOTE — Plan of Care
 Raymond Gardner					Location: NP [id number] y.o., male				Attending: Trina Ao, MD	Admit Date: 12/19/2022			[id number] LOS: 0 days INITIAL NUTRITION ASSESSMENTDIET ORDER: NPONo Known AllergiesANTHROPOMETRICS:Height: 71, per ptAdmit wt: 72.2kgDosing wt: 72.2kgBMI: 22.2Recent WUJ:WJXB/JYNW Weight Weight Scale Used 12/20/22 0745 72.2 kg In bed scale 12/20/22 0500 72.2 kg Bed Net IO Since Admission: No IO data has been entered for this period [12/20/22 0757].  No edema documented per RN flowsheet.Additional wt information:Past wts over the last 12 months per EPIC, using lowest of each month noted:02/09/22	74.4kg1/30/24		81.6kg2/8/24		80kg3/12/24		82.1kg5/8/24		80.3kg7/12/24		77.2kgStates admit wt is usual, denies recent changes.  Mild depletion to temple, shoulder and interosseous.  Could be reflective of age.SKIN:Per flowsheet documentation:No nutritionally relevant active issues documented.ESTIMATED NUTRITION REQUIREMENTS:Kcal/day: 1805 kcals	(25kcal/kg)Protein/day: 87 g	(1.2gm/kg)Fluids/day: Fluid management per medical team discretion. (27ml/kcal).Needs based on: dosing wt, AKINUTRITION ASSESSMENT:Chart reviewed, RN noted pt with nutrition risk screen trigger of difficulty chewing/swallowing.  H/o esophageal stenosis, barrett's esophagus.  Regular diet at home, mindful of portion sizes and engages in regular physical activity (walking, resistance) to maintain his wt and level of function.  No food allergies or intolerances.  Generally eating well PTA.  No complaints this AM.  NPO for EGD.  K 3.8, Cr 1.49.Cultural/Religious/Ethnic Needs: NoNUTRITION DIAGNOSIS:Inadequate oral intake related to medical status as evidenced by NPO.INTERVENTIONS/RECOMMENDATIONS: Enteral Nutrition:- If TF is needed consider:Jevity 1.2 at 60ml/hr, advance 21ml/hr q6hrs to goal rate 35ml/hrProvides: 1872kcals, 87g protein, free fluidDischarge Planning and Transfer of Nutrition Care:  Nutrition related discharge needs still being determined at this time.GOALS:Diet vs TFGoals to be evaluated at next assessmentMONITORING / EVALUATION:Food/Nutrition-Related History:Enteral and Parenteral Nutrition IntakeFood and nutrient intakeAnthropometric Measurements:Weight Biochemical Data, Medical Tests and Procedures:Electrolyte and renal profileGlucose/endocrine profileNutrition-Focus Physical FindingsOverall findings Will continue to follow, please consult or contact as needed.Electronically signed by Ferne Reus, RD November 8, 2024[facility] Role - Nutrition Service - [facility] M-F Adult Service FPlease note: Nutrition is a consult only service on weekends and holidays.  Please enter a consult in EPIC if assistance is needed on a weekend or holiday or via [facility] Dynamic Role as ?Food & Nutrition Adult Weekend Dietitian [facility]? to contact the covering RD.

## 2022-12-20 NOTE — Anesthesia Pre-Procedure Evaluation
 This is a 82 y.o. male scheduled for UPPER GI ENDOSCOPY; W/BALLOON DILATION, (< 30 MM DIAMETER).Review of Systems/ Medical History Patient summary, nursing notes, pre-procedure vitals, height, weight and NPO status reviewed.Anesthesia Evaluation:   Estimated body mass index.12/20/22 : 22.20 kg/m? Last patient weight recorded. 12/20/22 : 72.2 kg Last patient height recorded. 12/20/22 : 5' 11 (1.803 m) CC/HPI: 82 y/o man presenting for urgent upper GI endoscopy with cf/ pill impaction. Past Medical History:No date: Coronary artery disease12/29/2023: COVID-19No date: DysphagiaNo date: Essential hypertensionNo date: Hx of CABGNo date: HyperlipidemiaNo date: Premature ventricular contractionsNo date: Pulmonary emphysema [facility] Code)Past Surgical History:  Past Surgical History:2010: CORONARY ARTERY BYPASS GRAFTCardiovascular: Patient has a history of: hypertension.   -Exercise tolerance: >4 METS -Coronary Artery Disease: CAD, CABG (CABG in 2010) and cardiac stents -Valvular disease: history of valvular problems/murmurs aortic stenosis -Vascular Disease:   carotid artery disease and aortic disease.   -Other Cardiovascular:   Echo 03/14/22~ * Normal left ventricular size, wall thickness, systolic function and wall motion. LVEF calculated by biplane Simpson's was 58%.  Normal diastolic function and filling pressures.~ * Normal right ventricular cavity size and systolic function.~ * Moderate aortic valve calcification.  Mild aortic stenosis.  Peak aortic velocity is 2.5 m/s with a calculated peak aortic gradient of 26 mmHg.  Mean gradient is 13 mmHg.  Aortic valve area is 1.4 cm2.  Dimensionless index is 0.44.  Mild aortic regurgitation.~ * Normal mitral valve leaflets.  Mild posterior mitral annular calcification.  Trace mitral regurgitation.~ * Mild tricuspid regurgitation with normal right heart pressures.~ * No significant pericardial effusion.~ * No prior study available for comparison.Cardiology note 1/30/24Cardiology message 03/25/22 There is no problem doing an endoscopy while the patient has a Holter monitor on.  It was ordered as a 3 day monitor, so it should be finished by 2/14 anyway. Marland Kitchen Respiratory:  Patient has shortness of breath.     -Nicotine Dependence: Nicotine dependence: former smoker.-Lung Disorders: Patient has emphysema.       -COPD:  yes-Comments: [place] chest 12/28/23Impression No evidence of pulmonary embolism or acute thoracic pathology.Large hiatal hernia with intrathoracic stomach and containing nonobstructed portions of the colon.[facility] Notify System Classification: Routine.Neuromuscular: -Muscle disorders: neuromuscular disease Gastrointestinal/Genitourinary: -Gastrointestinal Disorders:  Patient has GERD.Hematological/Lymphatic: -Coagulopathy:  Patient has blood dyscrasia. He has thrombocytopenia.Endocrine/Metabolic: -Thyroid Disorders:  Patient has hypothyroidism.Behavioral/Social/Psychiatric & Syndromes:  Patient has depression.Additional Findings: VITAL SIGNS:Temp:  [36.2 ?C-36.4 ?C] 36.2 ?CPulse:  [64-75] 64Resp:  [16-20] 16BP: (100-147)/(67-82) 100/67SpO2:  [94 %-97 %] 94 %Device (Oxygen Therapy): room airLABS:Lab Results     Component                Value               Date                     WBC                      9.3                 12/19/2022               HGB                      16.1                12/19/2022               HCT  46.30               12/19/2022               PLT                      159                 12/19/2022               CHOL                     176                 02/15/2021               TRIG                     71                  02/15/2021               HDL                      77                  02/15/2021               LDL 86                  02/15/2021               ALT                      19                  12/19/2022               AST                      26                  12/19/2022               NA                       142                 12/19/2022               K                        3.8                 12/19/2022               CL                       104                 12/19/2022               CREATININE               1.49 (H)            12/19/2022               BUN  19 (H)              12/19/2022               CO2                      26                  12/19/2022               INR                      1.08 (H)            06/19/2022               GLU                      105 (H)             12/19/2022          ECG: sinus rhythm ECHO:5/24Left Ventricle: Left ventricular ejection fraction, by estimation, is 55 to 60%. The left ventricle has normal function. The left ventricle has no regional wall motion abnormalities. The left ventricular internal cavity size was normal in size. There is  no left ventricular hypertrophy. Left ventricular diastolic function could not be evaluated due to mitral annular calcification (moderate or greater). Left ventricular diastolic function could not be evaluated. Right Ventricle: The right ventricular size is normal. No increase in right ventricular wall thickness. Right ventricular systolic function is normal. There is mildly elevated pulmonary artery systolic pressure. The tricuspid regurgitant velocity is 2.99  m/s, and with an assumed right atrial pressure of 3 mmHg, the estimated right ventricular systolic pressure is 38.8 mmHg. Left Atrium: Left atrial size was normal in size. Right Atrium: Right atrial size was normal in size. Pericardium: There is no evidence of pericardial effusion. Mitral Valve: The mitral valve is degenerative in appearance. Moderate mitral annular calcification. No evidence of mitral valve regurgitation. No evidence of mitral valve stenosis. Tricuspid Valve: The tricuspid valve is normal in structure. Tricuspid valve regurgitation is moderate . No evidence of tricuspid stenosis. Aortic Valve: 2D AVA 1.88 cm2. 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 The aortic valve is calcified. Aortic valve regurgitation is mild. Aortic regurgitation PHT measures 400 msec. Aortic valve mean gradient measures 16.0 mmHg. Aortic valve peak gradient measures 24.3 mmHg. Aortic valve area, by VTI measures 0.79 cm?. Pulmonic Valve: The pulmonic valve was not well visualized. Pulmonic valve regurgitation is mild. No evidence of pulmonic stenosis. Aorta: The aortic root and ascending aorta are structurally normal, with no evidence of dilitation Physical ExamCardiovascular:    normal exam  Rhythm: regularPulmonary:  normal exam  Patient's breath sounds clear to auscultationAirway:  Mallampati: IITM distance: >3 FBNeck ROM: fullMouth Opening: >3cmDental:  Dentition: dentition poorAnesthesia PlanASA 3 The primary anesthesia plan is  general ETT. Perioperative Code Status confirmed: It is my understanding that the patient is currently designated as 'Full Code' and will remain so throughout the perioperative period.Anesthesia informed consent obtained. Consent obtained from: patientUse of blood products: consented  The post operative pain plan is IV analgesics and per surgeon management.Plan discussed with Attending and CRNA.Anesthesiologist's Pre Op NoteI personally evaluated and examined the patient prior to the intra-operative phase of care on the day of the procedure..  Discussed risks and benefits of general  anesthesia included but not limited to MI, stroke, respiratory complications, postoperative mechanical ventilation, sore throat, dental damage, aspiration, bruising and swelling from line placement, pain, and PONV. Patient understands these risks and wishes to proceed.

## 2022-12-20 NOTE — Plan of Care
 Plan of Care Overview/ Patient Status    Neuro: A/O x4, MAE, follows commands, no complaints of pain, PERRLA. CV: afebrile, SB-NSR 50-80's, map>65, 4 PIV remain patent. Resp: RA, sat goal well maintained, no cough or sob or secretion burden. Gi/Gu: diet advanced post procedure to clear liquids, abd soft non tender, no BM this shift, voiding via urinal. Skin: dry flakey, grossly intact, independent in bed. Social: son at bedside. Electronically Signed by Everlene Other, RN, December 20, 2022

## 2022-12-20 NOTE — H&P
 [facility]	MICU History & Physical Admission NoteHistory of Present Illness HPI:  Raymond Gardner is a 82 y.o. male with severe esophageal stenosis, Barrett's esophagus, CAD s/p CABG on DAPT, COPD who presented with esophageal food/ pill impaction due to severe stenosis to [place]. Patient has a known history of esophageal stenosis with last EGD 03/27/22 showing multiple ring-like benign esophageal stenosis. Patient reported acute dysphagia after taking his pills yesterday morning with inability to drink any fluids. He was initially able to manage his secretions but it got worse throughout the evening. Patient was transferred to [facility] ED and GI was consulted for foreign object retrieval with an XP scope. Given his anatomy and DAPT therapy, it was recommended that he goes through a diagnostic EGD in the morning and gets intubated for airway protection. Patient was then transferred to MICU. Medical History: Past Medical History: Diagnosis Date  Coronary artery disease   COVID-19 02/08/2022  Dysphagia   Essential hypertension   Hx of CABG   Hyperlipidemia   Premature ventricular contractions   Pulmonary emphysema (HC Code)    Past Surgical History: Procedure Laterality Date  CORONARY ARTERY BYPASS GRAFT  2010   Family History Family History Problem Relation Age of Onset  COPD Brother   Coronary Artery Disease Brother                       Prior to Admission Medications No current facility-administered medications on file prior to encounter. Current Outpatient Medications on File Prior to Encounter Medication Sig Dispense Refill  albuterol sulfate 90 mcg/actuation HFA aerosol inhaler Inhale 1 puff into the lungs every 6 (six) hours as needed.    aspirin 81 mg EC delayed release tablet every 24 hours.    ezetimibe (ZETIA) 10 mg tablet Take 1 tablet (10 mg total) by mouth daily. 90 tablet 4  gabapentin (NEURONTIN) 100 mg capsule Take 1 capsule (100 mg total) by mouth at bedtime.    magnesium oxide (MAG-OX) 400 mg (241.3 mg magnesium) tablet Take 1 tablet (400 mg total) by mouth daily. 90 tablet 3  montelukast (SINGULAIR) 10 mg tablet TAKE 1 TABLET(10 MG) BY MOUTH EVERY NIGHT 30 tablet 0  omeprazole (PRILOSEC) 20 mg capsule Take 1 capsule (20 mg total) by mouth 2 (two) times daily. 180 capsule 3  oxyCODONE (ROXICODONE) 5 mg Immediate Release tablet Take 1 tablet (5 mg total) by mouth every 6 (six) hours as needed for pain.    polyethylene glycol (MIRALAX) 17 gram packet Take 1 packet (17 g total) by mouth daily. Mix in 8 ounces of water, juice, soda, coffee or tea prior to taking. 14 each 2  rosuvastatin (CRESTOR) 40 mg tablet Take 1 tablet (40 mg total) by mouth daily. 90 tablet 3  senna (SENOKOT) 8.6 mg tablet Take 1 tablet (8.6 mg total) by mouth nightly. 30 tablet 11  STIOLTO RESPIMAT 2.5-2.5 mcg/actuation mist for inhalation INHALE 2 INHALATION INTO THE LUNGS DAILY 4 g 11  valsartan (DIOVAN) 80 mg tablet TAKE 1 TABLET(80 MG) BY MOUTH DAILY 30 tablet 0  Allergies No Known Allergies Review of Systems: A 12 point review of systems was completed and is negative with the exception of those things mentioned in the history of present illness above.Objective: Vitals:Temp:  [97.1 ?F (36.2 ?C)-98.1 ?F (36.7 ?C)] 98.1 ?F (36.7 ?C)Pulse:  [64-75] 71Resp:  [16-20] 16BP: (100-155)/(67-82) 155/80SpO2:  [94 %-98 %] 98 %Device (Oxygen Therapy): room airMedications/InfusionsI have reviewed the patient's current meds  as listed in the patient's EMR.Intake/Output: I have reviewed the patient's current I&O's as documented in the EMR.Physical: Physical Exam:GEN: not in acute distressHEENT: atraumatic, dry mouthCV: RRRPULM: clear to auscultation ABD: soft, non-tender, non-distendedEXT: no LE edema Skin: chronic erythematous skin changes? Throughout his body Neuro: AAOx4Studies:  Labs: I have reviewed the patient's labs within the last 24 hrs.Recent Labs Lab 11/07/241647 WBC 9.3 HGB 16.1 HCT 46.30 PLT 159 	Recent Labs Lab 11/07/241647 NEUTROPHILS 64.7 Recent Labs Lab 11/07/241647 NA 142 K 3.8 CL 104 CO2 26 BUN 19* CREATININE 1.49* GLU 105* ANIONGAP 12 	Recent Labs Lab 11/07/241647 CALCIUM 8.8 Recent Labs Lab 11/07/241647 ALT 19 AST 26 ALKPHOS 72 BILITOT 1.0 PROT 7.1 ALBUMIN 3.6 	No results for input(s): PTT, LABPROT, INR, FIBRINOGEN, DDIMER in the last 168 hours.Lab Results Component Value Date  BNPPRO 185.7 02/07/2022  TROPTHS 19 (H) 02/07/2022  TROPTHS 20 (H) 02/07/2022  TROPTHS 19 (H) 02/07/2022 No results for input(s): PHART, PCO2ART, PO2ART, HCO3ART, O2SATART in the last 168 hours.No results for input(s): PHVEN, PCO2VEN, HCO3VEN, O2SATVEN in the last 168 hours.Microbiology:NADiagnostics:CXR 11/7/24Hazy obscuration of the left hemidiaphragm is noted likely representing atelectasis, however aspiration/pneumonia could appear similar. Assessment & Plan [name] is a 82 y.o. male with severe esophageal stenosis, Barrett's esophagus, CAD s/p CABG on DAPT, COPD who presented with esophageal food/ pill impaction due to severe stenosis. Transferred to MICU for closer monitoring of airway as patient had difficulty managing his secretions and possible intubation prior to EGD. PLAN:NEUROLOGIC- no active issuesCARDIOVASCULAR- no active issues- hold DAPT for the EGD in the AM PULMONARY- CXR with R basal atelectasis but no respiratory issue currently - closely monitor for airway protection. May electively intubate prior to the procedure NEPHROLOGY/ELECTROLYTESAcute kidney injury- likely due to inability to take oral intake - IVF once IV access is established GASTROINTESTINAL/NUTRITIONSevere esophageal stenosis- concern for food/ pill impaction and inability to manage secretions- GI planning on doing urgent EGD with a XP scope in the morning ENDOCRINE- no active issues HEMATOLOGY/ONCOLOGY- no active issuesINFECTIOUS DISEASE- no active issues - potential aspiration but vital stable, no sign of infection SEDATION/ANALGESIA:- noneNUTRITION:- NPO ACCESS:- will need to establish IV access PPx:- DVT PPx: Hep SQ - held for the procedure - GI PPx: noneGOALS/DISPO/FAMILY:- Prior - Full code- Dispo: MICU Electronically Signed:Hyo-bin (angela) Nivaan Dicenzo, MD 12/20/2022 4:13 AM

## 2022-12-21 LAB — BASIC METABOLIC PANEL
BKR ANION GAP: 14 (ref 7–17)
BKR BLOOD UREA NITROGEN: 21 mg/dL (ref 8–23)
BKR BUN / CREAT RATIO: 16.8 (ref 8.0–23.0)
BKR CALCIUM: 8.4 mg/dL — ABNORMAL LOW (ref 8.8–10.2)
BKR CHLORIDE: 105 mmol/L (ref 98–107)
BKR CO2: 22 mmol/L (ref 20–30)
BKR CREATININE: 1.25 mg/dL (ref 0.40–1.30)
BKR EGFR, CREATININE (CKD-EPI 2021): 57 mL/min/{1.73_m2} — ABNORMAL LOW (ref >=60)
BKR GLUCOSE: 74 mg/dL (ref 70–100)
BKR POTASSIUM: 4 mmol/L (ref 3.3–5.3)
BKR SODIUM: 141 mmol/L (ref 136–144)

## 2022-12-21 LAB — CBC WITH AUTO DIFFERENTIAL
BKR WAM ABSOLUTE IMMATURE GRANULOCYTES.: 0.02 x 1000/ÂµL (ref 0.00–0.30)
BKR WAM ABSOLUTE LYMPHOCYTE COUNT.: 1.95 x 1000/ÂµL (ref 0.60–3.70)
BKR WAM ABSOLUTE NRBC (2 DEC): 0 x 1000/ÂµL (ref 0.00–1.00)
BKR WAM ANC (ABSOLUTE NEUTROPHIL COUNT): 5.1 x 1000/ÂµL (ref 2.00–7.60)
BKR WAM BASOPHIL ABSOLUTE COUNT.: 0.08 x 1000/ÂµL (ref 0.00–1.00)
BKR WAM BASOPHILS: 0.9 % (ref 0.0–1.4)
BKR WAM EOSINOPHIL ABSOLUTE COUNT.: 0.29 x 1000/ÂµL (ref 0.00–1.00)
BKR WAM EOSINOPHILS: 3.4 % (ref 0.0–5.0)
BKR WAM HEMATOCRIT (2 DEC): 42.5 % (ref 38.50–50.00)
BKR WAM HEMOGLOBIN: 14.1 g/dL (ref 13.2–17.1)
BKR WAM IMMATURE GRANULOCYTES: 0.2 % (ref 0.0–1.0)
BKR WAM LYMPHOCYTES: 22.7 % (ref 17.0–50.0)
BKR WAM MCH (PG): 33.4 pg — ABNORMAL HIGH (ref 27.0–33.0)
BKR WAM MCHC: 33.2 g/dL (ref 31.0–36.0)
BKR WAM MCV: 100.7 fL — ABNORMAL HIGH (ref 80.0–100.0)
BKR WAM MONOCYTE ABSOLUTE COUNT.: 1.14 x 1000/ÂµL — ABNORMAL HIGH (ref 0.00–1.00)
BKR WAM MONOCYTES: 13.3 % — ABNORMAL HIGH (ref 4.0–12.0)
BKR WAM MPV: 11.6 fL (ref 8.0–12.0)
BKR WAM NEUTROPHILS: 59.5 % (ref 39.0–72.0)
BKR WAM NUCLEATED RED BLOOD CELLS: 0 % (ref 0.0–1.0)
BKR WAM PLATELETS: 137 x1000/ÂµL — ABNORMAL LOW (ref 150–420)
BKR WAM RDW-CV: 12.5 % (ref 11.0–15.0)
BKR WAM RED BLOOD CELL COUNT.: 4.22 M/ÂµL (ref 4.00–6.00)
BKR WAM WHITE BLOOD CELL COUNT: 8.6 x1000/ÂµL (ref 4.0–11.0)

## 2022-12-21 LAB — MAGNESIUM: BKR MAGNESIUM: 1.6 mg/dL — ABNORMAL LOW (ref 1.7–2.4)

## 2022-12-21 LAB — PHOSPHORUS     (BH GH L LMW YH): BKR PHOSPHORUS: 3.4 mg/dL (ref 2.2–4.5)

## 2022-12-21 IMAGING — DX DG CHEST 2V
2 series · 2 of 2 positions shown · non-contrast
Comparison: None.

CLINICAL DATA: COPD

EXAM:
CHEST - 2 VIEW

[chest pa]
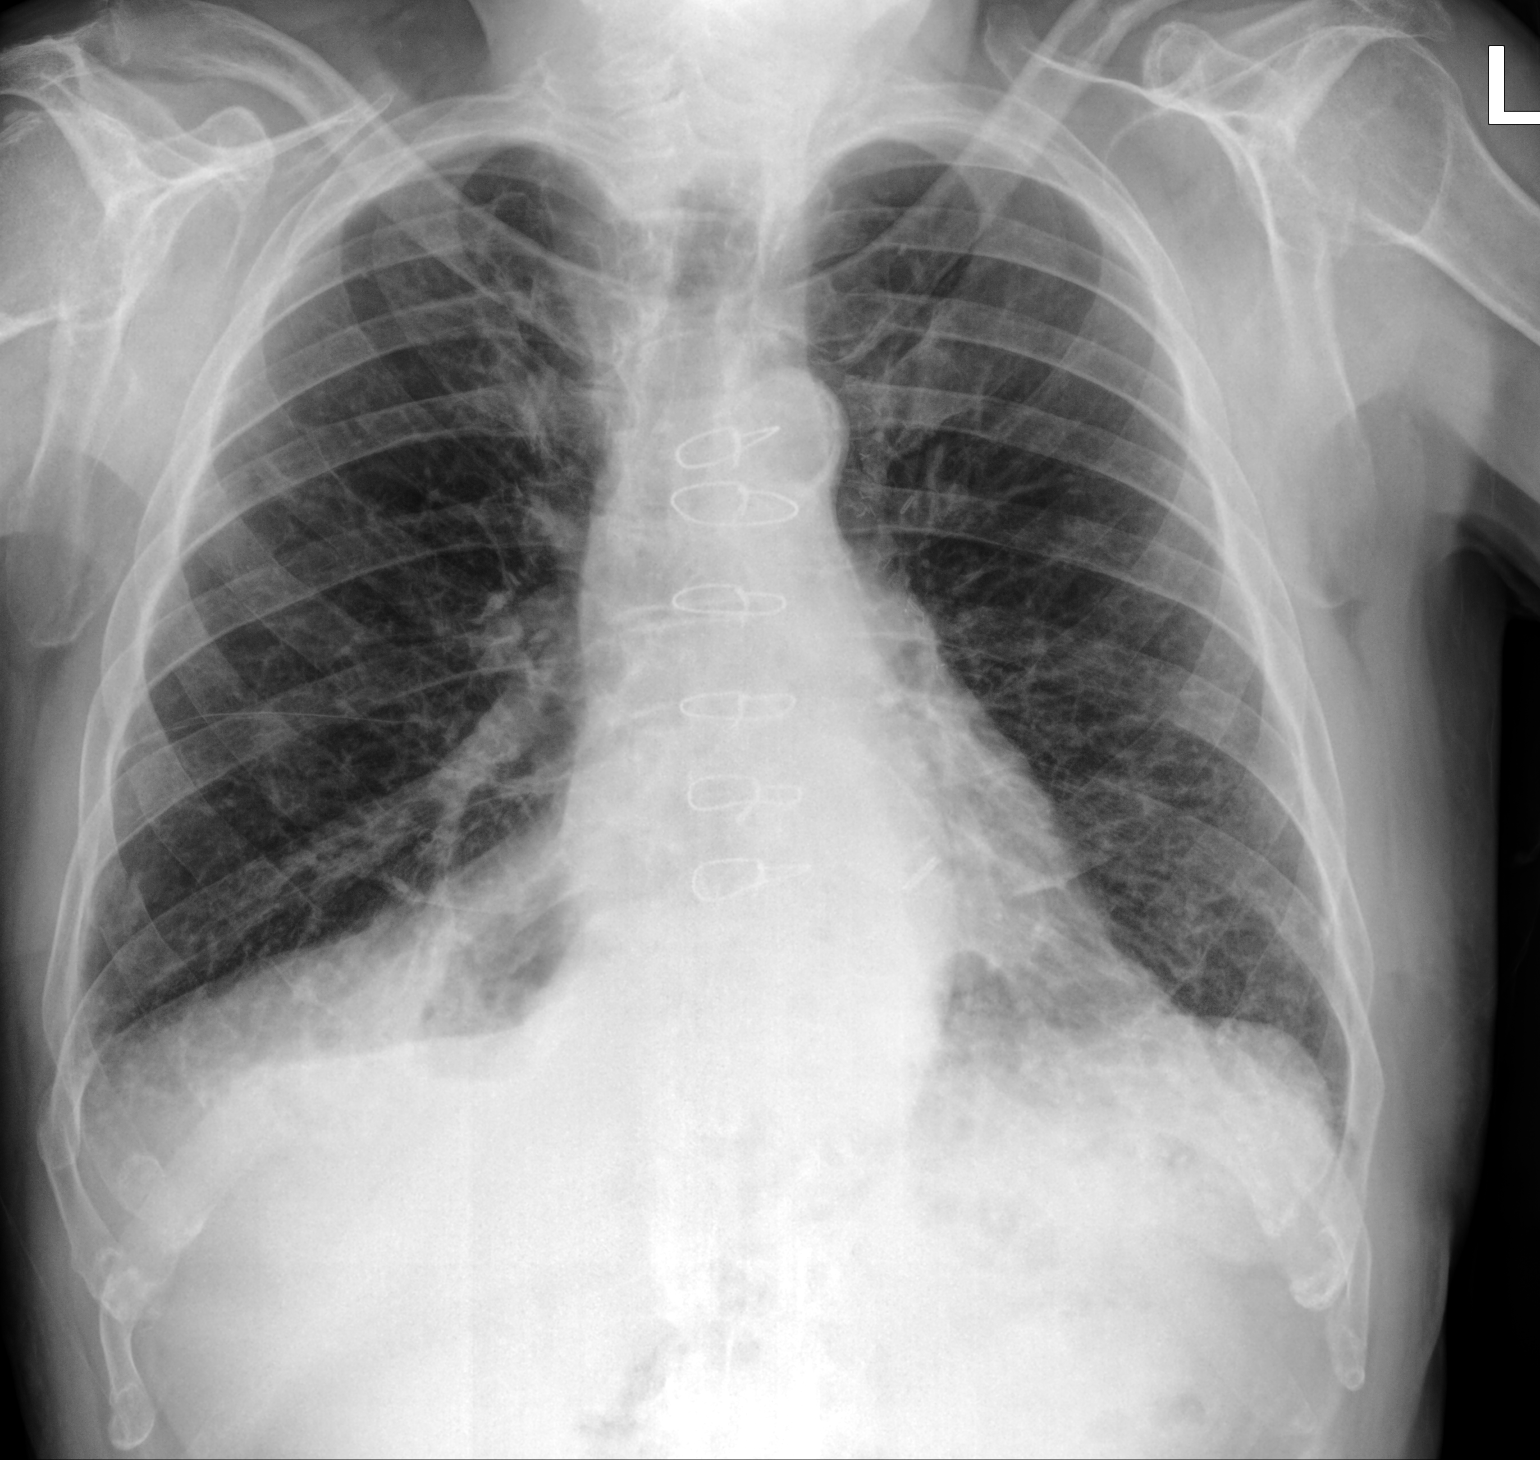

[chest lat]
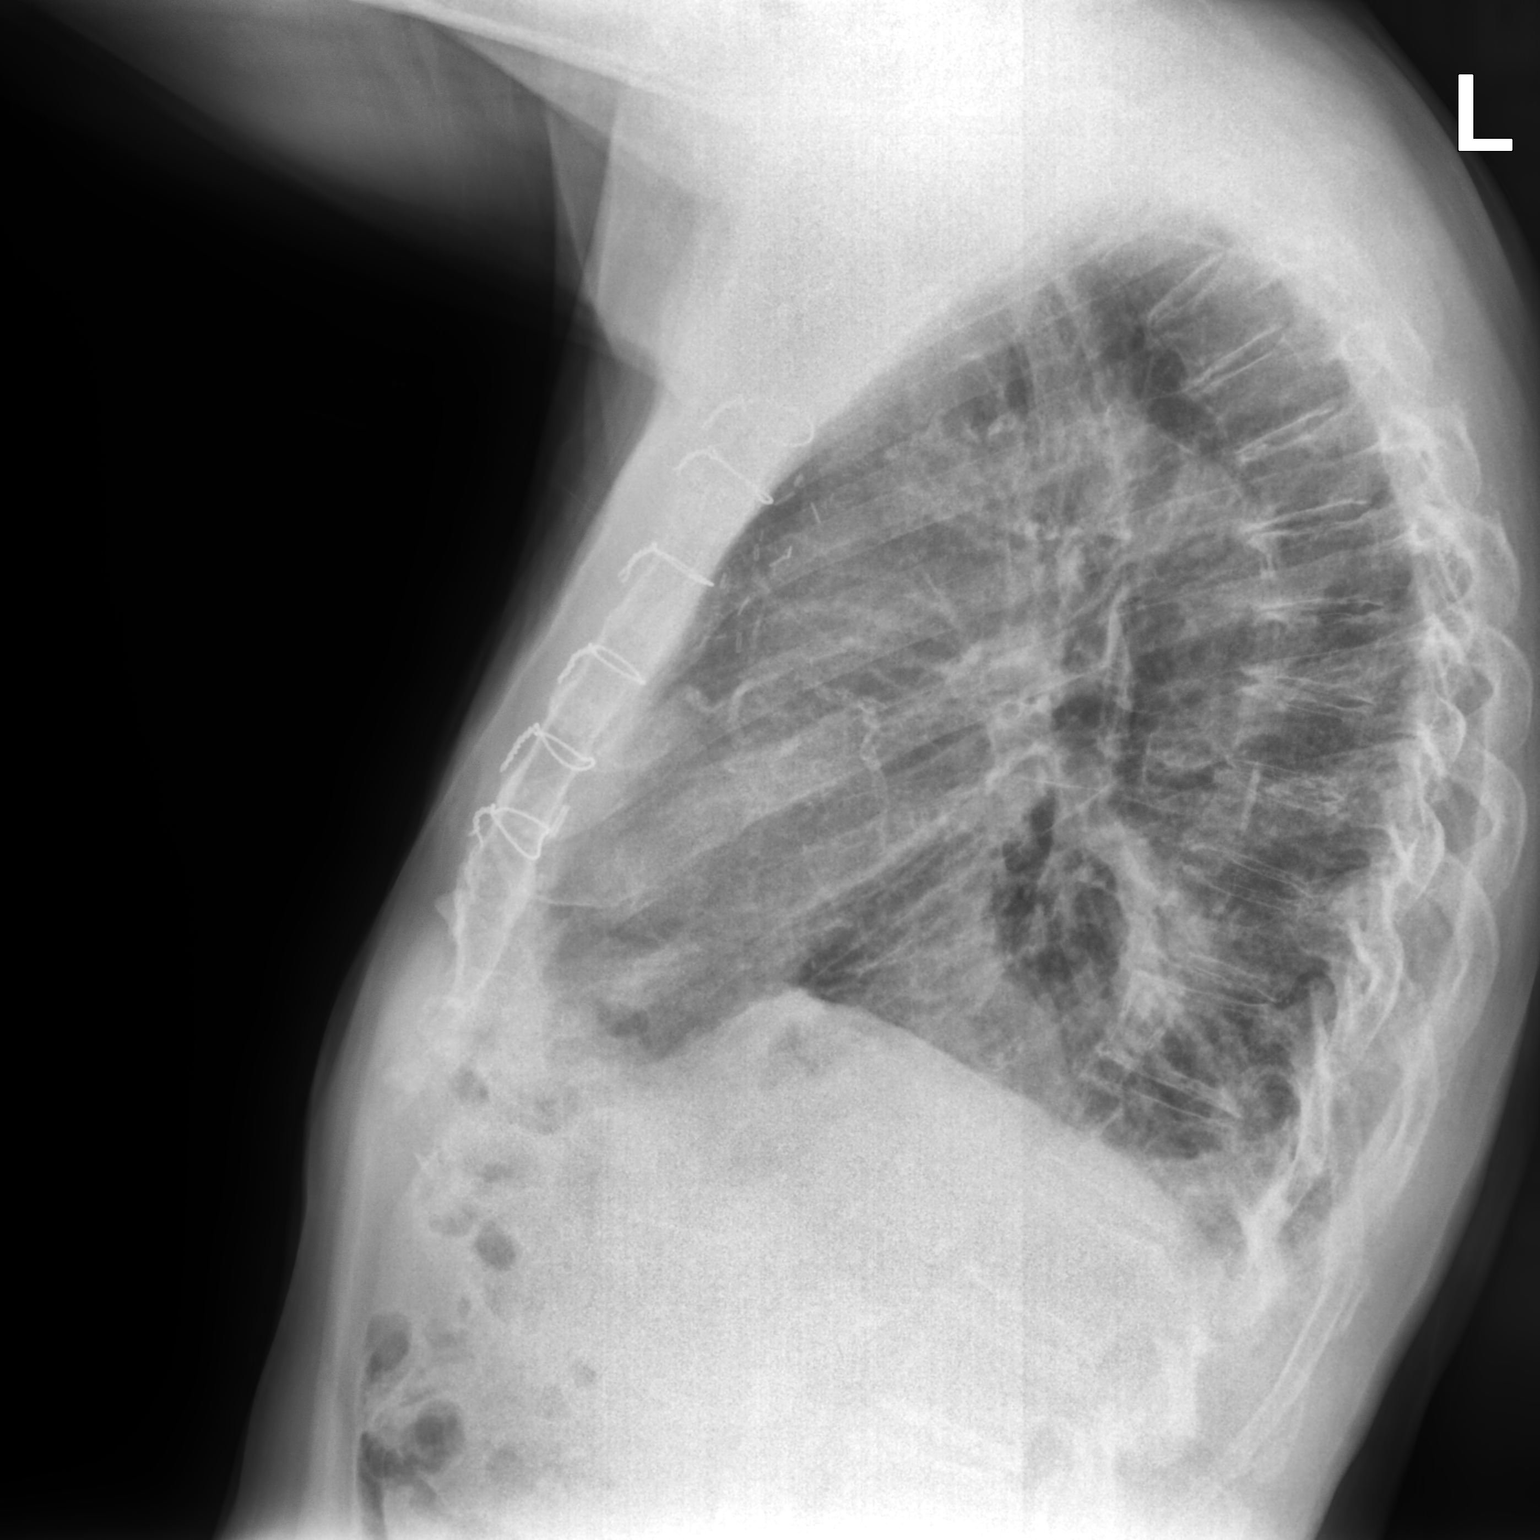

[2 of 2 positions shown; findings below may reference images not displayed]

FINDINGS: Moderate-sized hiatal hernia. Heart is normal size. Prior median
sternotomy. No confluent opacities or effusions. No acute bony
abnormality.
IMPRESSION: Moderate-sized hiatal hernia.

No active disease.

## 2022-12-21 MED ORDER — MAGNESIUM SULFATE 2 GRAM/50 ML (4 %) IN WATER INTRAVENOUS PIGGYBACK
2 | Freq: Once | INTRAVENOUS | Status: CP
Start: 2022-12-21 — End: ?
  Administered 2022-12-21: 15:00:00 2 mL/h via INTRAVENOUS

## 2022-12-21 MED ORDER — PANTOPRAZOLE 40 MG TABLET,DELAYED RELEASE
40 mg | Freq: Two times a day (BID) | ORAL | Status: DC
Start: 2022-12-21 — End: 2022-12-22

## 2022-12-21 MED ORDER — LANSOPRAZOLE 3 MG/ML ORAL SUSPENSION
3 mg/mL | Freq: Two times a day (BID) | ORAL | Status: DC
Start: 2022-12-21 — End: 2022-12-21

## 2022-12-21 MED ORDER — LANSOPRAZOLE 3 MG/ML ORAL SUSPENSION
3 mg/mL | Freq: Every day | ORAL | Status: DC
Start: 2022-12-21 — End: 2022-12-22
  Administered 2022-12-22: 13:00:00 3 mL via ORAL

## 2022-12-21 MED ORDER — CALCIUM GLUCONATE 1 GRAM/100 ML IN SODIUM CHLORIDE,ISO-OSM IV SOLUTION
1 | Freq: Once | INTRAVENOUS | Status: CP
Start: 2022-12-21 — End: ?
  Administered 2022-12-21: 15:00:00 1 mL/h via INTRAVENOUS

## 2022-12-21 MED ORDER — PANTOPRAZOLE 40 MG TABLET,DELAYED RELEASE
40 mg | ORAL_TABLET | Freq: Two times a day (BID) | ORAL | 1 refills | Status: AC
Start: 2022-12-21 — End: 2022-12-24

## 2022-12-21 NOTE — Progress Notes
 [facility] Advanced GI Progress NoteAttending Provider: Trina Ao, MD [phone] History: - EGD yesterday with multiple strictures and some impaction of food- Food was advanced to stomach, but dilation not attempted given DAPT- Feels well todayObjective: Vitals:Temp:  [97.2 ?F (36.2 ?C)-98.2 ?F (36.8 ?C)] 97.5 ?F (36.4 ?C)Pulse:  [46-73] 58Resp:  [10-25] 19BP: (103-144)/(57-76) 134/70SpO2:  [88 %-99 %] 91 %Device (Oxygen Therapy): room airO2 Flow (L/min):  [1-2] 1Physical Exam:GENERAL: Awake, cooperative, in no acute distress. Well nourished.HEENT: Normocephalic, atraumaticCARDIOVASCULAR: Regular rate and rhythm, no murmurs, no lower extremity edemaRESPIRATORY: No respiratory distress on RA, Clear to auscultation bilaterally GASTROINTESTINAL: Bowel Sounds Present, Soft, nondistended, nontender without guarding or rebound, no hepatosplenomegalySKIN: Warm and dry. No jaundice, no skin lesions on visualized skin NEURO: Alert and Oriented x 3, no focal deficitsMedications:SCHEDULED:Current Facility-Administered Medications Medication Dose Route Frequency Provider Last Rate Last Admin  aspirin EC delayed release tablet 81 mg  81 mg Oral Daily Paracha, Rumzah Anum, MD   81 mg at 12/21/22 0917  calcium gluconate 1 g in sodium chloride 100 mL IVPB 1 g  1 g Intravenous Once Sumner Boast, MD 100 mL/hr at 12/21/22 1000 Rate Verify at 12/21/22 1000  ethyl alcohol 62 % nasal swab 1 Application  1 Application Nasal Q12H Um, Hyo-Bin Marylene Land), MD   1 Application at 12/21/22 0917  heparin (PORCINE) injection 5,000 Units  5,000 Units Subcutaneous Q8H Um, Hyo-Bin Marylene Land), MD      magnesium sulfate in water 2 gram/50 mL (4 %) (IVPB) 2 g  2 g Intravenous Once Paracha, Rumzah Anum, MD 50 mL/hr at 12/21/22 1000 Rate Verify at 12/21/22 1000  montelukast (SINGULAIR) tablet 10 mg  10 mg Oral Nightly Paracha, Rumzah Anum, MD   10 mg at 12/20/22 2044  pantoprazole (PROTONIX) EC tablet 40 mg  40 mg Oral Q12H Paracha, Rumzah Anum, MD   40 mg at 12/21/22 0917  sodium chloride 0.9 % flush 3 mL  3 mL IV Push Q8H Um, Hyo-Bin Marylene Land), MD   3 mL at 12/21/22 0519  tiotropium-olodateroL (STIOLTO RESPIMAT) 2.5-2.5 mcg/actuation mist for inhalation 2 Inhalation   2 Inhalation  Inhalation Daily Paracha, Rumzah Anum, MD     NFA:OZHYQMVHQ sulfate, sodium chlorideLabs:Recent Labs Lab 11/07/241647 11/08/240741 11/09/240520 WBC 9.3 9.1 8.6 HGB 16.1 14.9 14.1 HCT 46.30 43.80 42.50 PLT 159 196 137*  Recent Labs Lab 11/07/241647 11/08/240741 11/09/240520 NEUTROPHILS 64.7 62.1 59.5  Recent Labs Lab 11/07/241647 11/08/240741 11/09/240520 NA 142 143 141 K 3.8  --  4.0 CL 104 109* 105 CO2 26 23 22  BUN 19* 20 21 CREATININE 1.49* 1.32* 1.25 GLU 105* 90 74 ANIONGAP 12 11 14   Recent Labs Lab 11/07/241647 11/08/240741 11/08/240741 11/09/240520 CALCIUM 8.8 8.9  --  8.4* MG  --  1.9   < > 1.6* PHOS  --   --   --  3.4  < > = values in this interval not displayed.  Recent Labs Lab 11/07/241647 ALT 19 AST 26 ALKPHOS 72 BILITOT 1.0 PROT 7.1 ALBUMIN 3.6  Recent Labs Lab 11/08/240741 PTT 25.5 LABPROT 11.2 INR 1.02  No results found for: IRON, TIBC, FERRITIN Imaging:CXR 11/7/24Impression:Hazy obscuration of the left hemidiaphragm is noted likely representing atelectasis, however aspiration/pneumonia could appear similar. Procedural:UPPER ENDOSCOPY (03/27/2022 10:01 AM) EGD 11/8/24Findings:     One benign-appearing, intrinsic severe stenosis was found 25 to 29 cm     from the incisors. This stenosis measured 6 mm (inner diameter) x 5 cm     (  in length). The mucosa was ulcerated for the entire length of the     stricture consistent with either pressure necrosis or severe erosive     esophagitis. The stenosis was traversed With difficulty.     At about 31 cm to 35 cm there was benign mucosal stricturing without any     ulceration noted. This was traversed.     Long segment Barretts esophagus was noted from 34 cm to 40 cm EG     junction no mass lesions were noted within this area.     Given the significant esophagitis findings and use of dual antiplatelet     agents dilation was not performed.     The entire examined stomach was normal. Food bolus was noted in the     gastric cardia and was likely pushed down with passage of the endoscope.     The in the duodenum was normal.                                                                              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 No mass lesion noted.                       - Likely food bolus impaction with food passage into                       the stomach with the endoscope.                       - Dilation not performed.                       - No specimens collected. Impression & Recommendations Raymond Gardner is a 82 y.o. male with PMH notable for dysphagia with esophageal stenosis, Barrett's esophagus, CAD on aspirin, COPD, who was transferred from [facility] for concern for esophageal impaction.EGD on 11/8 with multiple strictures as narrow as 6mm with severe ulceration that were able to be traversed with difficulty. There was some food bolus in the gastric cardia that was advanced distally into the stomach. Given DAPT, dilation was not attempted. He should remain on PPI therapy and limit diet to only liquids until dilation is able to be performed safely once he is off of DAPT. We will arrange outpatient follow up for him.Plan:- BID PPI. Could consider lansoprazole given liquid formulation- Repeat EGD in 2 weeks. He should hold Plavix for 1 week beforehand We will sign off and arrange outpatient f/uCase to be discussed with Dr. Ferd Glassing, final attending attestation to follow. Please do not hesitate to contact our team with any questions or concernsLawrence Grant Ruts, MDYale Digestive DiseasesBest Contact: [facility] [employer] [phone] after 5pm and weekends, please contact on-call GI fellow as listed on QGenda

## 2022-12-21 NOTE — Plan of Care
 Plan of Care Overview/ Patient Status    Transfer Note Nursing Raymond Gardner is a 82 y.o. male transferred from MICU with a chief complaint of Esophageal Blockage. Resolved now on full liquid diet. Plan for 2 weeks then out pt follow up.  Assisted in to bed.  Vitals:  12/21/22 1350 12/21/22 1400 12/21/22 1500 12/21/22 1633 BP:  105/64 110/71  Pulse: 68 66 63 61 Resp: (!) 27 (!) 21 (!) 22 20 Temp: 97.7 ?F (36.5 ?C)   97.6 ?F (36.4 ?C) TempSrc: Oral   Oral SpO2: (!) 93% (!) 86% (!) 91% (!) 93% Weight:    78.1 kg Height:      I have reviewed the patient's current medication orders.Current Facility-Administered Medications Medication Dose Route Frequency Provider Last Rate Last Admin  aspirin EC delayed release tablet 81 mg  81 mg Oral Daily Paracha, Rumzah Anum, MD   81 mg at 12/21/22 0917  ethyl alcohol 62 % nasal swab 1 Application  1 Application Nasal Q12H Um, Hyo-Bin Marylene Land), MD   1 Application at 12/21/22 0917  heparin (PORCINE) injection 5,000 Units  5,000 Units Subcutaneous Q8H Um, Hyo-Bin Marylene Land), MD   5,000 Units at 12/21/22 1533  montelukast (SINGULAIR) tablet 10 mg  10 mg Oral Nightly Paracha, Rumzah Anum, MD   10 mg at 12/20/22 2044  pantoprazole (PROTONIX) EC tablet 40 mg  40 mg Oral Q12H Vanita Ingles, MD      sodium chloride 0.9 % flush 3 mL  3 mL IV Push Q8H Um, Hyo-Bin Marylene Land), MD   3 mL at 12/21/22 1534  tiotropium-olodateroL (STIOLTO RESPIMAT) 2.5-2.5 mcg/actuation mist for inhalation 2 Inhalation   2 Inhalation  Inhalation Daily Paracha, Rumzah Anum, MD   2 Inhalation  at 12/21/22 1053  albuterol sulfate, sodium chlorideCurrent Facility-Administered Medications Medication Dose Route Frequency Last Rate  albuterol sulfate  1 puff Inhalation Q6H PRN    aspirin  81 mg Oral Daily    ethyl alcohol 62 %  1 Application Nasal Q12H    heparin (PORCINE)  5,000 Units Subcutaneous Q8H    montelukast  10 mg Oral Nightly pantoprazole  40 mg Oral Q12H    sodium chloride  3 mL IV Push Q8H    sodium chloride  3 mL IV Push PRN for Line Care    tiotropium-olodateroL  2 Inhalation  Inhalation Daily   .I have reviewed patient valuables Patient Valuables   Patient Valuables Flowsheet                    PATIENT VALUABLE(S)       Clothing Disposition At bedside/locker/closet 12/20/22 0654  Denture Disposition To/From OR/Procedural Area;In denture cup 12/20/22 1707   Cell phone disposition At bedside/locker/closet 12/20/22 0654  Jewelry disposition At bedside/locker/closet 12/20/22 0654   Wallet Disposition At bedside/locker/closet 12/20/22 0654     Problem: Fall Injury RiskGoal: Absence of Fall and Fall-Related Injury11/10/2022 1730 by Willette Alma, RNOutcome: Interventions implemented as appropriate11/10/2022 1730 by Willette Alma, RNOutcome: Interventions implemented as appropriate Problem: Adult Inpatient Plan of CareGoal: Plan of Care Review11/10/2022 1730 by Willette Alma, RNOutcome: Interventions implemented as appropriate11/10/2022 1730 by Willette Alma, RNOutcome: Interventions implemented as appropriateGoal: Patient-Specific Goal (Individualized)12/21/2022 1730 by Willette Alma, RNOutcome: Interventions implemented as appropriate11/10/2022 1730 by Willette Alma, RNOutcome: Interventions implemented as appropriateGoal: Absence of Hospital-Acquired Illness or Injury11/10/2022 1730 by Willette Alma, RNOutcome: Interventions implemented as appropriate11/10/2022 1730 by Willette Alma, RNOutcome: Interventions implemented as appropriateGoal: Optimal Comfort and Wellbeing11/10/2022 1730 by Willette Alma, RNOutcome: Interventions  implemented as appropriate11/10/2022 1730 by Willette Alma, RNOutcome: Interventions implemented as appropriateGoal: Readiness for Transition of Care11/10/2022 1730 by Willette Alma, RNOutcome: Interventions implemented as appropriate11/10/2022 1730 by Willette Alma, RNOutcome: Interventions implemented as appropriate

## 2022-12-21 NOTE — Progress Notes
 MICU Progress Note 11/09/24Admitted 12/19/2022 Attending Provider: Trina Ao, MD Hospital Day: 3- MICU Day: 17h Code status: Full Code  MICU CC: severe esophageal stenosis, cannot manage secretions, need to do elective intubation                            Hospital course: Raymond Gardner is a 82 yo M with PMH of severe esophageal stenosis, Barrett's esophagus, and CAD s/p CABG x4 in 2010 NOT ON DAPT, COPD who presents as a transfer for concern for esophageal food/pill impaction. The patient has known severe esophageal stenosis with last endoscopy 03/27/22 with findings notable for multiple ring-like benign appearing esophageal stenoses measuring 8mm in inner diameter at 25cm to 29 cm. This was initially bypassed with the adult endoscope but superficial mucosal disruption was noted in the proximal esophagus so the scope was exchanged to an XP to bypass the stenosis. Exam otherwise notable for long segment Barrett's (6cm). There were discussions regarding possible dilation at a future date, but this was not pursued as the patient was swallowing well with careful chewing. The patient presented after acute dysphagia following taking his pills at 8AM the day of presentation. He reports that the day prior, he was at a celebration and was eating and drinking more quickly than usual. This morning, he took his pills and felt them get stuck. He may have regurgitated some of the pills but has been unable to drink any fluids since then. He was initially tolerating his secretions upon presentation to the [facility] ED, but worsened throughout the evening.GI scope 11/08:- Multiple esophageal strictures with area in the mid  esophagus with severe ulceration. No mass lesion noted. - Likely food bolus impaction with food passage into  the stomach with the endoscope. - Dilation not performed as patient reported being on two antiplatelet agents- No specimens collected. Interval events: ON:- Neuro: A/O x4, MAE, follows commands, no complaints of pain, PERRLA. - CV: afebrile, SB-NSR 50-80's, brady when asleep to the low 50s/high 40s. map>65, 4 PIV remain patent. Labs drawn in am. - Resp: pt desatted x2 when asleep, placed on 1L NC with good effect, satting 94-97 percent on 1L. No Shortness of breath reported during desats, no increased work of breathing noted. - Gi/Gu: diet advanced post procedure to clear liquids, able to take pills whole one at a time with water. abd soft non tender, no BM this shift, voiding via urinal. - Skin: dry flakey, grossly intact, independent in bed. AM:- Rash on face, patch on skin- Dry, flaking skin on legs- On 1 L NC for comfortObjective: MEDS  Scheduled Meds:aspirin, 81 mg, Oral, Dailyethyl alcohol 62 %, 1 Application, Nasal, Q12Hheparin (PORCINE), 5,000 Units, Subcutaneous, Q8Hmontelukast, 10 mg, Oral, Nightlypantoprazole, 40 mg, Oral, Q12Hsodium chloride, 3 mL, IV Push, Q8Htiotropium-olodateroL, 2 Inhalation , Inhalation, Daily PRN Meds:albuterol sulfate, 1 puff, Q6H PRNsodium chloride, 3 mL, PRN for Line Care Continuous Meds   Vitals:Temp:  [96.2 ?F (35.7 ?C)-98.2 ?F (36.8 ?C)] 96.2 ?F (35.7 ?C)Pulse:  [46-73] 59Resp:  [10-25] 18BP: (103-144)/(57-76) 110/69SpO2:  [86 %-99 %] 96 %Device (Oxygen Therapy): room airO2 Flow (L/min):  [1-2] 1 on  %Wt: 12/21/22 72.8 kg 12/19/22 77.1 kg 08/23/22 77.2 kg  I/O's:Gross Totals (Last 24 hours) at 12/21/2022 1159Last data filed at 12/21/2022 1000Intake 787.18 ml Output 900 ml Net -112.82 ml 11/08 0701 - 11/09 0700In: 740 [P.O.:740]Out: 1000 [Urine:1000]Net IO Since Admission: -412.82 mL [12/21/22 1159]Last Bowel Movement: 12/19/22 PHYSICAL EXAM: General: NCAT.  comfortable appearing, NAD. AOx3HEENT:   MMM, anicteric sclerae. No LAD Cardiovascular:  Normal S1, S2. RRR, No m/r/gRespiratory: CTABL. No increased work of breathingGastrointestinal: Normoactive bowel sounds. Soft, nt, nd. No hsm appreciatedExtremities: extremities no swelling, wwp with +2 pulsesSkin: Erythematous skin changes on both sides of face, patch on chest. Dry scaly skin on the legsNeuro: A&Ox3, moving all extremities spontaneously and grossly normalPsych: Appropriate behavior. Appearance, cognition and speech WNL. Linear thought. Affect congruent to mood.  Intact Judgement.  LABS:ChemistryRecent Labs Lab 11/07/241647 11/07/241647 11/08/240741 11/09/240520 NA 142  --  143 141 K 3.8  --   --  4.0 CL 104  --  109* 105 CO2 26  --  23 22 BUN 19*  --  20 21 CREATININE 1.49*  --  1.32* 1.25 ANIONGAP 12  --  11 14 CALCIUM 8.8  --  8.9 8.4* MG  --    < > 1.9 1.6* PHOS  --   --   --  3.4  < > = values in this interval not displayed.  CBCRecent Labs Lab 11/07/241647 11/08/240741 11/09/240520 WBC 9.3 9.1 8.6 NEUTROPHILS 64.7 62.1 59.5 HGB 16.1 14.9 14.1 HCT 46.30 43.80 42.50 PLT 159 196 137* MCV 101.1* 99.8 100.7*  LFTsRecent Labs Lab 11/07/241647 ALT 19 AST 26 ALKPHOS 72 BILITOT 1.0 PROT 7.1 ALBUMIN 3.6 No results for input(s): LIPASE in the last 168 hours. CoagsRecent Labs Lab 11/08/240741 PTT 25.5 LABPROT 11.2 INR 1.02  GlucoseRecent Labs   11/07/241647 11/08/240741 11/09/240520 GLU 105* 90 74  Imaging/Diagnostics:24hrsCXR 11/7/24Hazy obscuration of the left hemidiaphragm is noted likely representing atelectasis, however aspiration/pneumonia could appear similar.Upper endoscopy 12/19/2409/08:Impression:            - Multiple esophageal strictures with area in the mid                       esophagus with severe ulceration. No mass lesion noted.                       - Likely food bolus impaction with food passage into                       the stomach with the endoscope.                       - Dilation not performed.                       - No specimens collected.Recommendation:        - Patient has a contact number available for                       emergencies. The signs and symptoms of potential                       delayed complications were discussed with the patient.                       Return to normal activities tomorrow. Written                       discharge instructions were provided to the patient.                       - Full liquid diet indefinitely.                       -  Continue present medications. B.i.d. PPI.                       - Repeat upper endoscopy in 2 weeks for retreatment.                      ASSESSMENT/PLAN Raymond Gardner is a 82 yo M with PMH of severe esophageal stenosis, Barrett's esophagus, and CAD s/p CABG on DAPT, COPD who presents as a transfer for concern for esophageal food/pill impaction. Systems based plan as follows:Neuro: - No active issuesCards: - NOT ON DAPT- Resume home ASA as indicated by GI following procedure- Son to bring bag of meds from home to help with dischargePulm: - CXR with R basal atelectasis but no respiratory issue currently - No issues during procedure. Breathing comfortably on room air as of 8 PMRenal: AKI resolved- Continue to monitorHeme: - no active issues- Hep sub-q ppxInfectious Diseases: No active issuesGI: Severe esophageal stenosis, consider EOE in conjunction with face findings- concern for food/ pill impaction and inability to manage secretions- continue to monitor now post-EGD- Liquid diet indefinitely: Clear liquid diet tonight, can advance to full liquids in AM, patient to remain on full liquid diet until the time of next EGD and dilation - Omeprazole 40 mg twice daily for 3 months and will likely need this dose indefinitely - Repeat upper endoscopy in 2 weeks for retreatment. We will need to be off ASA medication for at least 7 days prior to next endoscopy at which time dilation can be pursued. outpatient RN coordinators  notified via staff message in [place]. Call [phone] for appointments  - Nutrition consult for getting through the next two weeks of liquid dietDiet Full LiquidBowel Reg-- Last Bowel Movement: 11/07/24Endo: No active issuesMSK/Skin: - Monitor skin discolorations- Derm consult for face rash, chest patchMICU Checklist: 1. Sedation: None2. Hemodynamics: stable- Volume status: dry- Net goal for today: net zero3. Ventilator:   none4. Lines:  PIV x45. Foley:  none6. ABx:   None7. Skin: monitor for rash 8. PPx: - VTE: heparin subq- PULM: Oral care, Incentive spiro- GI: omeprazole 40 BID for 3 months- MSK: OOB, PT 9. Diet: clears tonight, then full liquids in AM10. Restraints: none11. Dispo: pending12. CODE STATUS: Full Code EC:  Primary Emergency ContactLYNIX, Raymond Gardner, Home Phone: [phone]           ---------  Signed:Reannah Totten Clementeen Graham, MDInternal Medicine, PGY-1Reachable through [facility]11/10/2022 -- 11:59 AM Please refer to attending note/addendum for final plan

## 2022-12-21 NOTE — Plan of Care
 Plan of Care Overview/ Patient Status    Neuro: A/O x4, MAE, follows commands, no complaints of pain, PERRLA. CV: afebrile, SB-NSR 50-80's, brady when asleep to the low 50s/high 40s. map>65, 4 PIV remain patent. Labs drawn in am. Resp: pt desatted x2 when asleep, placed on 1L NC with good effect, satting 94-97 percent on 1L. No Shortness of breath reported during desats, no increased work of breathing noted. Gi/Gu: diet advanced post procedure to clear liquids, able to take pills whole one at a time with water. abd soft non tender, no BM this shift, voiding via urinal. Skin: dry flakey, grossly intact, independent in bed. Social: family called for update, updated and patient notified of the call.

## 2022-12-21 NOTE — ED Provider Notes
 Chief Complaint Patient presents with  Difficulty Swallowing   Difficulty swallowing food, water, pills x 2-3 days, feels things are getting stuck in the esophagus, had to go to [place] ed about 6-8 months ago for same but symptoms resolved spontaneously; no excess saliva noted no distress HPI/PE:82 year old complains of unable to swallow a pill today went to take his omeprazole, felt it get stuck around mid sternum height, and eventually he coughed it back.  Similarly unable to tolerate any food even well to today, gets stuck and he coughs it back up.  Can swallow small amounts of water, can tolerate saliva but has uncomfortable sensation retrosternally.Had episode in February much the same symptoms, had subsequent endoscopyUpper endo 2/14 Carl Best. [facility] at [facility] warm dry no acute distress easy respirations no increased work of breathing no murmursMDM:Concern for food impaction versus some other kind of acutely developing strictureDiscussed with Dr. Rosanne Gutting at [place] via the [employer], will be seen in the [place] ED consideration for endoscopy tonight  Physical ExamED Triage Vitals [12/19/22 1549]BP: 125/81Pulse: 71Pulse from  O2 sat: 71Resp: 20Temp: 97.6 ?F (36.4 ?C)Temp src: TemporalSpO2: 97 % BP 126/82  - Pulse 70  - Temp 97.6 ?F (36.4 ?C) (Temporal)  - Resp 18  - Ht 5' 11 (1.803 m)  - Wt 77.1 kg (170 lb)  - SpO2 97%  - BMI 23.71 kg/m? Physical Exam ProceduresAttestation/Critical CareClinical Impressions as of 12/21/22 0834 Food impaction of esophagus, initial encounter  ED DispositionTransfer To Another Facility Shawnie Pons [place], MD11/07/24 1651 Shawnie Pons [place], MD11/09/24 [phone]

## 2022-12-21 NOTE — Progress Notes
 MICU Progress Note 11/08/24Admitted 12/19/2022 Attending Provider: Trina Ao, MD Hospital Day: 2- MICU Day: 1h Code status: Full Code  MICU CC: severe esophageal stenosis, cannot manage secretions, need to do elective intubation                                                      Hospital course: Mr. Bosma is a 81 yo M with PMH of severe esophageal stenosis, Barrett's esophagus, and CAD s/p CABG on DAPT, COPD who presents as a transfer for concern for esophageal food/pill impaction. The patient has known severe esophageal stenosis with last endoscopy 03/27/22 with findings notable for multiple ring-like benign appearing esophageal stenoses measuring 8mm in inner diameter at 25cm to 29 cm. This was initially bypassed with the adult endoscope but superficial mucosal disruption was noted in the proximal esophagus so the scope was exchanged to an XP to bypass the stenosis. Exam otherwise notable for long segment Barrett's (6cm). There were discussions regarding possible dilation at a future date, but this was not pursued as the patient was swallowing well with careful chewing. The patient presented after acute dysphagia following taking his pills at 8AM the day of presentation. He reports that the day prior, he was at a celebration and was eating and drinking more quickly than usual. This morning, he took his pills and felt them get stuck. He may have regurgitated some of the pills but has been unable to drink any fluids since then. He was initially tolerating his secretions upon presentation to the [facility] ED, but worsened throughout the evening.Interval events: Evaluated by GI with plans for removal in OR however noted his stricture and need for pedi-scope./advanced endo and CAE, which cannot be completed until this AM. Throughout the night he has had progressive dysphagia and difficulty handling secretions, transferred to the MICU for suctioning and potentially need for intubation. Objective: MEDS  Scheduled Meds:[START ON 12/21/2022] aspirin, 81 mg, Oral, Dailyethyl alcohol 62 %, 1 Application, Nasal, Q12H[Held by provider] heparin (PORCINE), 5,000 Units, Subcutaneous, Q8Hmontelukast, 10 mg, Oral, Nightlypantoprazole, 40 mg, Oral, Q12Hsodium chloride, 3 mL, IV Push, Q8H[START ON 12/21/2022] tiotropium-olodateroL, 2 Inhalation , Inhalation, Daily PRN Meds:albuterol sulfate, 1 puff, Q6H PRNsodium chloride, 3 mL, PRN for Line Care Continuous Meds   Antibiotics: Start date #Vitals:Temp:  [44 ?F (36.1 ?C)-98.1 ?F (36.7 ?C)] 97.2 ?F (36.2 ?C)Pulse:  [49-79] 68Resp:  [9-23] 21BP: (100-159)/(60-90) 103/74SpO2:  [92 %-99 %] 94 %Device (Oxygen Therapy): room airO2 Flow (L/min):  [2] 2 on  %Wt: 12/20/22 72.2 kg 12/19/22 77.1 kg 08/23/22 77.2 kg  I/O's:Gross Totals (Last 24 hours) at 12/20/2022 1946Last data filed at 12/20/2022 1900Intake -- Output 400 ml Net -400 ml 11/07 1501 - 11/08 1500In: - Out: 300 [Urine:300]Net IO Since Admission: -400 mL [12/20/22 1946]Last Bowel Movement: 12/19/22 Ventilator Setting:   Rate:  TV:  PEEP:  FiO2:  %P/F ratio:    /  0.   = N/A        Most Recent ABG:  PHYSICAL EXAM: General: NCAT. comfortable appearing, NAD. AOx3HEENT:   MMM, anicteric sclerae. No LAD Cardiovascular:  Normal S1, S2. RRR, No m/r/gRespiratory: CTABL. No increased work of breathingGastrointestinal: Normoactive bowel sounds. Soft, nt, nd. No hsm appreciatedExtremities: extremities no swelling, wwp with +2 pulsesSkin: Chronic erythematous skin changes throughout body, to be monitoredNeuro: A&Ox3, moving all extremities spontaneously  and grossly normalPsych: Appropriate behavior. Appearance, cognition and speech WNL. Linear thought. Affect congruent to mood.  Intact Judgement.  LABS:ChemistryRecent Labs Lab 11/07/241647 11/08/240741 NA 142 143 K 3.8  --  CL 104 109* CO2 26 23 BUN 19* 20 CREATININE 1.49* 1.32* ANIONGAP 12 11 CALCIUM 8.8 8.9 MG  --  1.9  CBCRecent Labs Lab 11/07/241647 11/08/240741 WBC 9.3 9.1 NEUTROPHILS 64.7 62.1 HGB 16.1 14.9 HCT 46.30 43.80 PLT 159 196 MCV 101.1* 99.8  LFTsRecent Labs Lab 11/07/241647 ALT 19 AST 26 ALKPHOS 72 BILITOT 1.0 PROT 7.1 ALBUMIN 3.6 No results for input(s): LIPASE in the last 168 hours. CoagsRecent Labs Lab 11/08/240741 PTT 25.5 LABPROT 11.2 INR 1.02  GlucoseRecent Labs   11/07/241647 11/08/240741 GLU 105* 90  Lactate, Procal, D-dimer, BNP, Trop, CRPNo results for input(s): LACTATE, PROCALCITON, DDIMER, BNP, BNPPRO, TROPONINT, HSCRP, FERRITIN, FIBRINOGEN in the last 168 hours.Invalid input(s): BTYPENATRIUR ABG:No results for input(s): PHART, PCO2ART, PO2ART, HCO3ART, O2SATART, LITERFLOW in the last 168 hours. VBG:No results for input(s): PHVEN, PCO2VEN, PO2VEN, HCO3VEN, O2SATVEN, LITERFLOW in the last 168 hours. UrineNo results for input(s): SPECGRAV, PHUR, BLOODU, PROTEINUA, GLUCOSEUR, KETONESU, BILIRUBINUR, UROBILINOGEN, LEUKOCYTESUR, WBCUA, NITRITE, BACTERIA, EPITHELIALCE, RBCUA, COGRCA in the last 72 hours. MicroLab Results Component Value Date  LABBLOO No Growth after 5 days of incubation 02/07/2022  LABBLOO No Growth after 5 days of incubation 02/07/2022  Lab Results Component Value Date  LEGIONELLAAG Negative 06/20/2022  SPNEUMONIAEU Negative 06/20/2022 Lab Results Component Value Date  SARSCOV2 Negative 06/21/2022  SARSCOV2 Positive (A) 02/07/2022 RVP: Lab Results Component Value Date  INFLUENZAART Not Detected 06/21/2022  INFLUENZBRT Not Detected 06/21/2022  Pending Labs:Imaging/Diagnostics:24hrsCXR 11/7/24Hazy obscuration of the left hemidiaphragm is noted likely representing atelectasis, however aspiration/pneumonia could appear similar.EKG/Echo: PendingUpper endoscopy:11/08:Impression:            - Multiple esophageal strictures with area in the mid                       esophagus with severe ulceration. No mass lesion noted.                       - Likely food bolus impaction with food passage into                       the stomach with the endoscope.                       - Dilation not performed.                       - No specimens collected.Recommendation:        - Patient has a contact number available for                       emergencies. The signs and symptoms of potential                       delayed complications were discussed with the patient.                       Return to normal activities tomorrow. Written                       discharge instructions were provided to the patient.                       -  Full liquid diet indefinitely.                       - Continue present medications. B.i.d. PPI.                       - Repeat upper endoscopy in 2 weeks for retreatment.                       We will need to be off Plavix medication for at least                       7 days prior to next endoscopy at which time dilation                       can be performed.                       - Discharge per primary team. ASSESSMENT/PLAN Mr. Macgillivray is a 82 yo M with PMH of severe esophageal stenosis, Barrett's esophagus, and CAD s/p CABG on DAPT, COPD who presents as a transfer for concern for esophageal food/pill impaction. Systems based plan as follows:Neuro: - No active issuesCards: - NOT ON DAPT- Resume home ASA as indicated by GI following procedurePulm: - CXR with R basal atelectasis but no respiratory issue currently - No issues during procedure. Breathing comfortably on room air as of 8 PMRenal: Acute kidney injury- likely due to inability to take oral intake - IVF once IV access is established Heme: - no active issues- Hep sub-q ppxInfectious Diseases: No active issuesGI: Severe esophageal stenosis- concern for food/ pill impaction and inability to manage secretions- continue to monitor now post-EGD- Liquid diet indefinitely: Clear liquid diet tonight, can advance to full liquids in AM, patient to remain on full liquid diet until the time of next EGD and dilation - Omeprazole 40 mg twice daily for 3 months and will likely need this dose indefinitely - Repeat upper endoscopy in 2 weeks for retreatment. We will need to be off ASA medication for at least 7 days prior to next endoscopy at which time dilation can be pursued. outpatient RN coordinators  notified via staff message in [place]. Call [phone] for appointments  Diet Clear LiquidBowel Reg-- Last Bowel Movement: 11/07/24Endo: No active issuesMSK/Skin: - Monitor skin discolorationsMICU Checklist: 1. Sedation: None2. Hemodynamics: stable- Volume status: dry- Net goal for today: net zero3. Ventilator:   none4. Lines:  PIV x45. Foley:  none6. ABx:   None7. Skin: monitor for rash 8. PPx: - VTE: heparin subq- PULM: Oral care, Incentive spiro- GI: omeprazole 40 BID for 3 months- MSK: OOB, PT 9. Diet: clears tonight, then full liquids in AM10. Restraints: none11. Dispo: pending12. CODE STATUS: Full Code EC:  Primary Emergency ContactPATTY, THAM, Home Phone: [phone]           ---------  Signed:Chrysa Rampy Clementeen Graham, MDInternal Medicine, PGY-1Reachable through [facility]11/09/2022 -- 7:46 PM Please refer to attending note/addendum for final plan

## 2022-12-21 NOTE — Other
 Raymond Gardner  REQUEST  DOCUMENTATION-[facility] NOTE-Type of consult: [facility] -New Consult: [id number] Mechele Collin / Location: [id number]/[id number] / Brief Clinical Question: Hello, Raymond Gardner is here for esophageal strictures, now s/p EGD 11/8, has erythema on both sides of his face and a patch on his left chest that he says is new. Would love derm guidance of mgmt of this kind gentleman's skin. Thanks so much in advance./Callback Cell Phone: [phone]/ Use .consultnotetemplate or add .consultrecommendation to your consult notes. Remind your attending to use .consultattestation /Please confirm receipt of this message by texting back ?OK?-1 - Mobile Heartbeat message sent to Arcelia Jew at 11:53 AM. Received response at 1154.-Ausencio Vaden Dick11/9/202411:53 [facility] [phone]

## 2022-12-22 DIAGNOSIS — Z79899 Other long term (current) drug therapy: Secondary | ICD-10-CM

## 2022-12-22 DIAGNOSIS — J439 Emphysema, unspecified: Secondary | ICD-10-CM

## 2022-12-22 DIAGNOSIS — T18128A Food in esophagus causing other injury, initial encounter: Secondary | ICD-10-CM

## 2022-12-22 DIAGNOSIS — Z8546 Personal history of malignant neoplasm of prostate: Secondary | ICD-10-CM

## 2022-12-22 DIAGNOSIS — K222 Esophageal obstruction: Secondary | ICD-10-CM

## 2022-12-22 DIAGNOSIS — E785 Hyperlipidemia, unspecified: Secondary | ICD-10-CM

## 2022-12-22 DIAGNOSIS — Z951 Presence of aortocoronary bypass graft: Secondary | ICD-10-CM

## 2022-12-22 DIAGNOSIS — K2210 Ulcer of esophagus without bleeding: Secondary | ICD-10-CM

## 2022-12-22 DIAGNOSIS — Z955 Presence of coronary angioplasty implant and graft: Secondary | ICD-10-CM

## 2022-12-22 DIAGNOSIS — I10 Essential (primary) hypertension: Secondary | ICD-10-CM

## 2022-12-22 DIAGNOSIS — Z923 Personal history of irradiation: Secondary | ICD-10-CM

## 2022-12-22 DIAGNOSIS — Z7982 Long term (current) use of aspirin: Secondary | ICD-10-CM

## 2022-12-22 DIAGNOSIS — L853 Xerosis cutis: Secondary | ICD-10-CM

## 2022-12-22 DIAGNOSIS — Z8616 Personal history of COVID-19: Secondary | ICD-10-CM

## 2022-12-22 DIAGNOSIS — J9811 Atelectasis: Secondary | ICD-10-CM

## 2022-12-22 DIAGNOSIS — K2100 Gastro-esophageal reflux disease with esophagitis, without bleeding: Secondary | ICD-10-CM

## 2022-12-22 DIAGNOSIS — Z87891 Personal history of nicotine dependence: Secondary | ICD-10-CM

## 2022-12-22 DIAGNOSIS — I2510 Atherosclerotic heart disease of native coronary artery without angina pectoris: Secondary | ICD-10-CM

## 2022-12-22 DIAGNOSIS — D7589 Other specified diseases of blood and blood-forming organs: Secondary | ICD-10-CM

## 2022-12-22 DIAGNOSIS — N179 Acute kidney failure, unspecified: Secondary | ICD-10-CM

## 2022-12-22 DIAGNOSIS — L570 Actinic keratosis: Secondary | ICD-10-CM

## 2022-12-22 DIAGNOSIS — D696 Thrombocytopenia, unspecified: Secondary | ICD-10-CM

## 2022-12-22 LAB — CBC WITH AUTO DIFFERENTIAL
BKR WAM ABSOLUTE IMMATURE GRANULOCYTES.: 0.02 x 1000/ÂµL (ref 0.00–0.30)
BKR WAM ABSOLUTE LYMPHOCYTE COUNT.: 2.2 x 1000/ÂµL (ref 0.60–3.70)
BKR WAM ABSOLUTE NRBC (2 DEC): 0 x 1000/ÂµL (ref 0.00–1.00)
BKR WAM ANC (ABSOLUTE NEUTROPHIL COUNT): 4.57 x 1000/ÂµL (ref 2.00–7.60)
BKR WAM BASOPHIL ABSOLUTE COUNT.: 0.05 x 1000/ÂµL (ref 0.00–1.00)
BKR WAM BASOPHILS: 0.6 % (ref 0.0–1.4)
BKR WAM EOSINOPHIL ABSOLUTE COUNT.: 0.3 x 1000/ÂµL (ref 0.00–1.00)
BKR WAM EOSINOPHILS: 3.6 % (ref 0.0–5.0)
BKR WAM HEMATOCRIT (2 DEC): 43.9 % (ref 38.50–50.00)
BKR WAM HEMOGLOBIN: 15 g/dL (ref 13.2–17.1)
BKR WAM IMMATURE GRANULOCYTES: 0.2 % (ref 0.0–1.0)
BKR WAM LYMPHOCYTES: 26.1 % (ref 17.0–50.0)
BKR WAM MCH (PG): 33.3 pg — ABNORMAL HIGH (ref 27.0–33.0)
BKR WAM MCHC: 34.2 g/dL (ref 31.0–36.0)
BKR WAM MCV: 97.6 fL (ref 80.0–100.0)
BKR WAM MONOCYTE ABSOLUTE COUNT.: 1.28 x 1000/ÂµL — ABNORMAL HIGH (ref 0.00–1.00)
BKR WAM MONOCYTES: 15.2 % — ABNORMAL HIGH (ref 4.0–12.0)
BKR WAM MPV: 12 fL (ref 8.0–12.0)
BKR WAM NEUTROPHILS: 54.3 % (ref 39.0–72.0)
BKR WAM NUCLEATED RED BLOOD CELLS: 0 % (ref 0.0–1.0)
BKR WAM PLATELETS: 131 x1000/ÂµL — ABNORMAL LOW (ref 150–420)
BKR WAM RDW-CV: 12.3 % (ref 11.0–15.0)
BKR WAM RED BLOOD CELL COUNT.: 4.5 M/ÂµL (ref 4.00–6.00)
BKR WAM WHITE BLOOD CELL COUNT: 8.4 x1000/ÂµL (ref 4.0–11.0)

## 2022-12-22 LAB — BASIC METABOLIC PANEL
BKR ANION GAP: 11 (ref 7–17)
BKR BLOOD UREA NITROGEN: 16 mg/dL (ref 8–23)
BKR BUN / CREAT RATIO: 12.9 (ref 8.0–23.0)
BKR CALCIUM: 8.5 mg/dL — ABNORMAL LOW (ref 8.8–10.2)
BKR CHLORIDE: 102 mmol/L (ref 98–107)
BKR CO2: 23 mmol/L (ref 20–30)
BKR CREATININE: 1.24 mg/dL (ref 0.40–1.30)
BKR EGFR, CREATININE (CKD-EPI 2021): 58 mL/min/{1.73_m2} — ABNORMAL LOW (ref >=60)
BKR GLUCOSE: 78 mg/dL (ref 70–100)
BKR POTASSIUM: 3.9 mmol/L (ref 3.3–5.3)
BKR SODIUM: 136 mmol/L (ref 136–144)

## 2022-12-22 LAB — MAGNESIUM: BKR MAGNESIUM: 1.7 mg/dL (ref 1.7–2.4)

## 2022-12-22 LAB — PHOSPHORUS     (BH GH L LMW YH): BKR PHOSPHORUS: 3.2 mg/dL (ref 2.2–4.5)

## 2022-12-22 MED ORDER — FLUTICASONE PROPIONATE 50 MCG/ACTUATION NASAL SPRAY,SUSPENSION
50 mcg/actuation | Freq: Every day | NASAL | Status: DC
Start: 2022-12-22 — End: 2022-12-22
  Administered 2022-12-22: 13:00:00 50 mcg/actuation via NASAL

## 2022-12-22 MED ORDER — FLUTICASONE PROPIONATE 50 MCG/ACTUATION NASAL SPRAY,SUSPENSION
50 | Freq: Every day | NASAL | 3 refills | Status: AC
Start: 2022-12-22 — End: ?

## 2022-12-22 NOTE — Discharge Instructions
 Dear Raymond Gardner was a pleasure taking care of you at [facility]. Summary of your hospital stay:You were hospitalized for food impaction. During your hospitalization, we performed an endoscopy that showed severe narrowing in your esophagus. You will undergo a repeat endoscopy in two weeks for dilation and biopsy. Please note the following medication changes:- It is okay to continue to take aspirin until the procedure.- Stop taking OMEPRAZOLE.- Replace it with PANTOPRAZOLE 40 mg twice daily which has been sent to your [facility].- If you develop difficulty swallowing pantoprazole, please discuss with your primary doctor or gastroenterologist if you can obtain liquid LANSOPRAZOLE from a compounding pharmacy.Please do the following upon discharge:- You will be contacted by your gastroenterologist to arrange a follow up endoscopy. - Continue to eat only a liquid diet until your gastroenterologist is able to perform a dilation of the esophagus.- Return to the emergency department if you get something stuck in your esophagus again.You will follow up with:Future Appointments     Provider Department Center  02/25/2023 2:00 PM Elease Etienne, MD  [facility] [place]. [place]  Do not hesitate to call your doctor if you have any additional questions or concerns.Best wishes from your team here at [facility].

## 2022-12-22 NOTE — Plan of Care
 Plan of Care Overview/ Patient Status0700-1020[name] was discharged via Private Car accompanied by Alone.  Verbalized understanding of discharge instructionsand recommended follow up care as per the after visit summary.  Written discharge instructions provided. Denies any further questions. Pt waiting in unit's relaxation room for family. Family notified and aware. Pt educated on full liquid diet. Vital signs    Vitals:  12/21/22 2343 12/22/22 0400 12/22/22 0741 12/22/22 0800 BP: 130/79 111/68 119/60  Pulse: 61 (!) 59 (!) 54  Resp: 18 20 18   Temp: 97.5 ?F (36.4 ?C) 97.9 ?F (36.6 ?C) 97.7 ?F (36.5 ?C)  TempSrc: Oral Oral Oral  SpO2: 98% 96% 97%  Weight:    78.8 kg Height:     Patient confirmed all belongings returned. Belongings charted in last 7 days: Patient Valuables   Patient Valuables Flowsheet                    PATIENT VALUABLE(S)       Clothing Disposition At bedside/locker/closet 12/20/22 0654  Denture Disposition To/From OR/Procedural Area;In denture cup 12/20/22 1707   Cell phone disposition At bedside/locker/closet 12/20/22 0654  Jewelry disposition At bedside/locker/closet 12/20/22 0654   Wallet Disposition At bedside/locker/closet 12/20/22 0654     Problem: Fall Injury RiskGoal: Absence of Fall and Fall-Related Injury11/11/2022 1028 by Annamaria Boots, RNOutcome: Outcome(s) achieved11/11/2022 0953 by Annamaria Boots, RNOutcome: Interventions implemented as appropriate Problem: Adult Inpatient Plan of CareGoal: Plan of Care Review11/11/2022 1028 by Annamaria Boots, RNOutcome: Outcome(s) achieved11/11/2022 0953 by Annamaria Boots, RNOutcome: Interventions implemented as appropriateFlowsheets (Taken 12/22/2022 0143 by Stevphen Meuse, RN)Progress: no changePlan of Care Reviewed With: patientGoal: Patient-Specific Goal (Individualized)12/22/2022 1028 by Annamaria Boots, RNOutcome: Outcome(s) achieved11/11/2022 0953 by Annamaria Boots, RNOutcome: Interventions implemented as appropriateFlowsheets (Taken 12/22/2022 0810)What Anxieties, Fears, Concerns or Questions Do You Have About Your Care?: None statedIndividualized Preferences and Care Needs: per POCPatient Centered Long Term Goal: safe d/cPatient Centered Daily Goal: safety restGoal: Absence of Hospital-Acquired Illness or Injury11/11/2022 1028 by Annamaria Boots, RNOutcome: Outcome(s) achieved11/11/2022 0953 by Annamaria Boots, RNOutcome: Interventions implemented as appropriateGoal: Optimal Comfort and Wellbeing11/11/2022 1028 by Annamaria Boots, RNOutcome: Outcome(s) achieved11/11/2022 0953 by Annamaria Boots, RNOutcome: Interventions implemented as appropriateGoal: Readiness for Transition of Care11/11/2022 1028 by Annamaria Boots, RNOutcome: Outcome(s) achieved11/11/2022 0953 by Annamaria Boots, RNOutcome: Interventions implemented as appropriate Problem: Skin Injury Risk IncreasedGoal: Skin Health and Integrity11/11/2022 1028 by Annamaria Boots, RNOutcome: Outcome(s) achieved11/11/2022 0953 by Annamaria Boots, RNOutcome: Interventions implemented as appropriate

## 2022-12-22 NOTE — Plan of Care
 Plan of Care Overview/ Patient Status    1900-0700Neuro: Alert and oriented to person, place, time, and situation. CV: Telemetry monitoring in place; bradycardia to normal sinus rhythm overnight Resp: Room air; uses 1L nasal cannula at night to prevent decreases in oxygen saturation.GI Last Bowel Movement: 12/19/22; continent of bowel. Full liquid diet. GU; continent of urine; voids without difficulty using urinal at bedside and within reach Skin: rash on scalp and back; scattered scabs on bilateral legs. Blanchable redness on coccyx; T+P Q2hr and barrier cream applied. Mobility: AMPAC Mobility Score: 19 Assist of 1 with rolling walker. Bed alarm in place, all safety measures implemented.Significant events from this shift: Denied pain and nausea. Uses 1L nasal cannula when sleeping to prevent decreases in oxygen saturation. Had episode of some shortness of breath overnight; repositioned and nasal cannula adjusted in nose with good effect- shortness of breath subsided and oxygen saturation steady at 96-97%. Report given to oncoming nurse. Problem: Adult Inpatient Plan of CareGoal: Plan of Care ReviewOutcome: Interventions implemented as appropriateFlowsheets (Taken 12/22/2022 0143)Progress: no changePlan of Care Reviewed With: patient

## 2022-12-22 NOTE — Transfer Summaries
 Admission Note Nursing Raymond Gardner is a 82 y.o. male admitted with a chief complaint of dysphagia. Patient arrived from  Patient is    Vitals:  12/21/22 1128 12/21/22 1200 12/21/22 1300 12/21/22 1350 BP: 110/69 109/63 114/66  Pulse: (!) 59 (!) 59 81 68 Resp: 18 20 (!) 22 (!) 27 Temp: (!) 96.2 ?F (35.7 ?C)   97.7 ?F (36.5 ?C) TempSrc: Axillary   Oral SpO2: 96% (!) 93% (!) 91% (!) 93% Weight:     Height:     Oxygen therapy Oxygen TherapySpO2: (!) 93 %Device (Oxygen Therapy): nasal cannulaO2 Flow (L/min): 1I have reviewed the patient's current medication orders..I have reviewed patient valuables Belongings charted in last 7 days: Patient Valuables   Patient Valuables Flowsheet                    PATIENT VALUABLE(S)       Clothing Disposition At bedside/locker/closet 12/20/22 0654  Denture Disposition To/From OR/Procedural Area;In denture cup 12/20/22 1707   Cell phone disposition At bedside/locker/closet 12/20/22 0654  Jewelry disposition At bedside/locker/closet 12/20/22 0654   Wallet Disposition At bedside/locker/closet 12/20/22 0654     Comments:n/aSee flowsheets, patient education and plan of care for additional information. Handoff given to EP 9-7 RN, Maxie Better.Neuro - A/O x4.  PERRLA.  MAE; motor strength b/l UE 5+, LE 4+.  No focal neuro deficits.  Denies pain.  Musculoskeletal - A1 T&R.CV - S brady/NSR.  RRR.  S1S2, murmur (whooshing) on S1.  Edema dep 1+. Resp - Lungs CTAB.  SpO2 goal 88-92%.  RA.  Desats w/ sleep; high 80s.  Pt reports desats have been occurring for months; MICU team aware.  GI/GU - LBM PTA.  UOP appropriate to urinal.  BS audile.  Full Liq diet.Skin - Rash head/neck.Social - Nursing updated daughter-in-law.Pt from home Meds were not with the Pt nor were they documented by MICU nursing as accompanying the Pt upon transfer.  Nursing spoke with [facility] ED nurse and they reported no documented home meds either.T & R q2H.  Hourly rounding performed.  Bed lowest position, bed alarm on for patient safety.  Call bell within reach.  Safety precautions maintained.  Needs attended to.  Medication administered as ordered; see MAR. See Flowsheet for further details.Argentina Ponder, RN, MICU [phone]

## 2022-12-22 NOTE — Plan of Care
 Raymond Gardner					Location: EP [id number] y.o., male				Attending: Juliane Poot, MD	Admit Date: 12/19/2022			[id number] LOS: 2 days NUTRITION CONSULT ASSESSMENT Dietary Orders (From admission, onward)     Start     Ordered  12/21/22 0748  Diet Full Liquid  DIET EFFECTIVE NOW      Question:  Initiate Nutrition Management Protocol (Yes/No?)  Answer:  Yes - Initiate Procotol  12/21/22 0748    No Known AllergiesANTHROPOMETRICS:Height: 71, per ptAdmit wt: 72.2kgCurrent wt: 78.8 kg Dosing wt: 72.2kgBMI: 24.3 Recent SEG:BTDV/VOHY Weight Weight Scale Used 12/20/22 0745 72.2 kg In bed scale 12/20/22 0500 72.2 kg Bed Net IO Since Admission: Pt noted to be -1.5L per I&O data as of 12/22/22].  No edema documented per RN flowsheet. Additional wt information:Past wts over the last 12 months per EPIC, using lowest of each month noted:02/09/2373.4kg1/30/24              81.6kg2/8/24                80kg3/12/24              82.1kg5/8/24                80.3kg7/12/24              77.2kgStates admit wt is usual, denies recent changes.  Mild depletion to temple, shoulder and interosseous.  Could be reflective of age. SKIN:Per flowsheet documentation:No nutritionally relevant active issues documented. ESTIMATED NUTRITION REQUIREMENTS:Kcal/day: 1805 kcals     (25kcal/kg)Protein/day: 87 g           (1.2gm/kg)Fluids/day: Fluid management per medical team discretion. (33ml/kcal).Needs based on: dosing wt, AKI NUTRITION ASSESSMENT:PT EMR reviewed for nutrition consult regarding requirement for patient to remain on Full liquid diet for two weeks. Pt initially seen for nutrition risk screen trigger of difficulty chewing/swallowing.  H/o esophageal stenosis, barrett's esophagus. Pt now s/p Upper EGD with plan for dilation. Pt noted to have multiple esophageal strictures in the mid esophagus with severe ulceration. Also likely food bolus impaction with food passage into stomach with the endoscope. Dilation not performed as pt noted on two antiplatelet agents. Plan to remain on FL diet until next EGD in two weeks. Pt met at bedside provided detailed education for managing nutrition over the next two weeks. Pt provided a detailed menu outlining full liquid and appropriate foods to consume over the next two weeks. Pt demonstrated a very good understanding and stated intention to be compliant with recommendations to avoid recurrence of impaction/ reason for admit.  Cultural/Religious/Ethnic Needs: No NUTRITION DIAGNOSIS:Inadequate oral intake related to medical status as evidenced by NPO. INTERVENTIONS/RECOMMENDATIONS: Oral Nutrition:- continue full liquid diet. Would also consider offering smooth pureed foods to help optimize nutrition and help with satiety and quality of life. - Pt encouraged to consume high calorie, high protein oral supplements such as Ensure plus and Boost to support nutrition during the next two weeks. Pt also able to make his own smoothies at home with emphasis on smooth consistency. - Pt provided written menu to help support decision making once home. Goal:- pt to consume >75% of nutrition needs. Discharge Planning and Transfer of Nutrition Care:  Nutrition related discharge needs still being determined at this time. MONITORING / EVALUATION: Food/Nutrition-Related History:Enteral and Parenteral Nutrition IntakeFood and nutrient intake Anthropometric Measurements:Weight  Biochemical Data, Medical Tests and Procedures:Electrolyte and renal profileGlucose/endocrine profile Nutrition-Focus Physical FindingsOverall findings Will continue to follow, please consult or contact as needed. Electronically signed by Sherrilee Gilles, RD, November 10, 2024[facility] Dynamic Role -  Nutrition Service - [facility] M-F Adult Service JPlease note: Nutrition is a consult only service on weekends and holidays.  Please enter a consult in EPIC if assistance is needed on a weekend or holiday or via [facility] Dynamic Role as ?Food & Nutrition Adult Weekend Dietitian [facility]? to contact the covering RD

## 2022-12-23 ENCOUNTER — Encounter: Admit: 2022-12-23 | Payer: PRIVATE HEALTH INSURANCE | Primary: Internal Medicine

## 2022-12-23 NOTE — Discharge Summary
 [facility]	 [facility]	Medicine Discharge SummaryPatient Data:  Patient Name: Raymond Gardner Age: 82 y.o. DOB: 04-11-1940	 MRN: [id number]	 Admit date: 11/7/2024Discharge date: 11/10/2024Discharge Attending Physician:  Juliane Poot, MD PCP: Elease Etienne, MDPrincipal Diagnosis: Esophageal obstruction due to food impactionOther Diagnosis: Esophageal stricture, Esophagitis, actinic keratosisIssues to be addressed post discharge: Outpatient GI to arrange follow up EGD in 2 weeks for esophageal dilation and biopsy. Will continue liquid diet and PPI until then. 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 He developed an impaction consisting of food and an omeprazole capsule.  Monitored in MICU overnight, did not ultimately require intubation. EGD showed multiple esophageal strictures and ulceration, no mass. Food bolus was advanced to stomach. Biopsy and dilation were both deferred due to initial lack of clarity regarding home DAPT; subsequently determined he is in fact taking aspirin only. He will continue full liquid diet and PPI BID until follow up EGD in 2 weeks for esophageal dilation and biopsy. Dermatology was consulted for an erythematous, non-pruritic, nonpainful facial rash. He was diagnosed with diffuse actinic skin damage and recommended topical emollients and outpatient follow up.Pending Labs and Tests: NonePertinent lab findings and test results: Recent Labs Lab 11/08/240741 11/09/240520 11/10/240340 WBC 9.1 8.6 8.4 HGB 14.9 14.1 15.0 HCT 43.80 42.50 43.90 PLT 196 137* 131*  Recent Labs Lab 11/08/240741 11/09/240520 11/10/240340 NEUTROPHILS 62.1 59.5 54.3  Recent Labs Lab 11/08/240741 11/09/240520 11/10/240340 NA 143 141 136 K  --  4.0 3.9 CL 109* 105 102 CO2 23 22 23  BUN 20 21 16  CREATININE 1.32* 1.25 1.24 GLU 90 74 78 ANIONGAP 11 14 11   Recent Labs Lab 11/08/240741 11/08/240741 11/09/240520 11/10/240340 CALCIUM 8.9  --  8.4* 8.5* MG 1.9  --  1.6* 1.7 PHOS  --    < > 3.4 3.2  < > = values in this interval not displayed.  Recent Labs Lab 11/07/241647 ALT 19 AST 26 ALKPHOS 72 BILITOT 1.0 ALBUMIN 3.6  Recent Labs Lab 11/08/240741 PTT 25.5 LABPROT 11.2 INR 1.02 No results for input(s): TROPONINT in the last 168 hours. Microbiology:NoneDiagnostics:EGD 11/8/24Impression:            - Multiple esophageal strictures with area in the mid                        esophagus with severe ulceration. No mass lesion noted.                        - Likely food bolus impaction with food passage into                        the stomach with the endoscope.                        - Dilation not performed.                        - No specimens collected. CXRResult Date: 11/7/2024Study: XR CHEST PA AND LATERAL History: Question of food impaction  Comparison: XR CHEST PA OR AP 2022-06-19 Findings: Hazy obscuration of the left hemidiaphragm is noted likely representing atelectasis. There is no pleural effusion or pneumothorax. Soft tissue density at the right lung base likely represents known right-sided hiatal hernia. The postsurgical cardiomediastinal silhouette is stable status post CABG Median sternotomy wires are intact. Calcifications are noted within a tortuous thoracic aorta. Fracture, old, of the right lateral ninth rib. Impression: Hazy obscuration of the left hemidiaphragm is noted likely representing atelectasis, however aspiration/pneumonia could appear similar.  [facility] Notify System Classification: Routine. Report initiated by:  Robyn Haber, MD Reported and signed by: Virl Axe, MD  [facility] Discharge vitals: Blood pressure 119/60, pulse (!) 54, temperature 97.7 ?F (36.5 ?C), temperature source Oral, resp. rate 18, height 5' 11 (1.803 m), weight 78.8 kg, SpO2 97%.Discharge Physical Exam:Physical ExamConstitutional:     General: He is not in acute distress.   Appearance: Normal appearance. He is not ill-appearing. Eyes:    Conjunctiva/sclera: Conjunctivae normal. Cardiovascular:    Rate and Rhythm: Normal rate and regular rhythm.    Pulses: Normal pulses.    Heart sounds: Murmur heard.    No gallop. Pulmonary:    Effort: Pulmonary effort is normal.    Breath sounds: Normal breath sounds. Abdominal:    General: Abdomen is flat.    Palpations: Abdomen is soft.    Tenderness: There is no abdominal tenderness. Musculoskeletal:    Cervical back: Neck supple.    Right lower leg: No edema.    Left lower leg: No edema. Skin:   General: Skin is warm and dry.    Findings: Rash present. Neurological:    Mental Status: He is alert and oriented to person, place, and time. Mental status is at baseline. Psychiatric:       Mood and Affect: Mood normal.       Behavior: Behavior normal. Discharge Medications: Current Discharge Medication List  START taking these medications  Details fluticasone propionate (FLONASE) 50 mcg/actuation nasal spray Use 1 spray in each nostril daily.Qty: 16 g, Refills: 2Start date: 12/23/2022  pantoprazole (PROTONIX) 40 mg tablet Take 1 tablet (40 mg total) by mouth every 12 (twelve) hours.Qty: 60 tablet, Refills: 0Start date: 12/21/2022, End date: 01/20/2023   CONTINUE these medications which have NOT CHANGED  Details albuterol sulfate 90 mcg/actuation HFA aerosol inhaler Inhale 1 puff into the lungs every 6 (six) hours as needed.  aspirin 81 mg EC delayed release tablet every 24 hours.  ezetimibe (ZETIA) 10 mg tablet Take 1 tablet (10 mg total) by mouth daily.Qty: 90 tablet, Refills: 4  gabapentin (NEURONTIN) 100 mg capsule Take 1 capsule (100 mg total) by mouth at bedtime.  magnesium oxide (MAG-OX) 400 mg (241.3 mg magnesium) tablet Take 1 tablet (400 mg total) by mouth daily.Qty: 90 tablet, Refills: 3  montelukast (SINGULAIR) 10 mg tablet TAKE 1 TABLET(10 MG) BY MOUTH EVERY NIGHTQty: 30 tablet, Refills: 0  oxyCODONE (ROXICODONE) 5 mg Immediate Release tablet Take 1 tablet (5 mg total) by mouth every 6 (six) hours as needed for pain.  polyethylene glycol (MIRALAX) 17 gram packet Take 1 packet (17 g total) by mouth daily. Mix in 8 ounces of water, juice, soda, coffee or tea prior to taking.Qty: 14 each, Refills: 2  rosuvastatin (CRESTOR) 40 mg tablet Take 1 tablet (40 mg total) by mouth daily.Qty: 90 tablet, Refills: 3  senna (SENOKOT) 8.6 mg tablet Take 1 tablet (8.6 mg total) by mouth nightly.Qty: 30 tablet, Refills: 11  STIOLTO  RESPIMAT 2.5-2.5 mcg/actuation mist for inhalation INHALE 2 INHALATION INTO THE LUNGS DAILYQty: 4 g, Refills: 11  valsartan (DIOVAN) 80 mg tablet TAKE 1 TABLET(80 MG) BY MOUTH DAILYQty: 30 tablet, Refills: 0   STOP taking these medications   omeprazole (PRILOSEC) 20 mg capsule    Follow-up Information:No follow-up provider specified. PMH PSH Past Medical History: Diagnosis Date  Coronary artery disease   COVID-19 02/08/2022  Dysphagia   Essential hypertension   Hx of CABG   Hyperlipidemia   Premature ventricular contractions   Pulmonary emphysema (HC Code)   Past Surgical History: Procedure Laterality Date  CORONARY ARTERY BYPASS GRAFT  2010  Social History Family History Social History Tobacco Use  Smoking status: Former   Current packs/day: 0.00   Types: Cigarettes   Quit date: 1987   Years since quitting: 37.8  Smokeless tobacco: Never Substance Use Topics  Alcohol use: Yes   Comment: 7 glasses of wine per week, 1 per day  Family History Problem Relation Age of Onset  COPD Brother   Coronary Artery Disease Brother   Electronically Signed:Hadrian Keenes, MDJustin A Damontay Alred, MDAvailable on [facility]

## 2022-12-23 NOTE — Consults
 INPATIENT DERMATOLOGY NEW CONSULTCONSULTATION REQUESTED BY: Sumner Boast, MD QUESTION: [name] is here for esophageal strictures, now s/p EGD 11/8, has erythema on both sides of his face and a patch on his left chest that he says is new. Would love derm guidance of mgmt of this kind gentleman's skin. Thanks so much in advance. HISTORY OF PRESENT ILLNESS: Raymond Gardner is a 82 y.o. male with a history of severe esophageal stenosis, Barrett's esophagus, and CAD s/p CABG x4 in 2010 NOT ON DAPT, COPD [facility] for esophageal [facility]. Dermatology was consulted for skin eruption on the face and legs. Patient with hx NMSC and AK with nonpruritic, nonpainful skin eruption noted today by the primary care team. States he's seen dermatology in the past for treatment of NMSC (per patient, surgical procedures, LN2) but was lost to follow up as he deferred further treatment. REVIEW OF SYSTEMS:No fever, chills, night sweats, headache, vision changes, eye pain, unintentional weight loss.PAST DERM HISTORY: NMSCPAST MEDICAL HISTORY:Past Medical History: Diagnosis Date  Coronary artery disease   COVID-19 02/08/2022  Dysphagia   Essential hypertension   Hx of CABG   Hyperlipidemia   Premature ventricular contractions   Pulmonary emphysema (HC Code)  NEW MEDICATIONS:OUTPATIENT MEDICATIONS:Medications Prior to Admission Medication Sig Dispense Refill Last Dose  albuterol sulfate 90 mcg/actuation HFA aerosol inhaler Inhale 1 puff into the lungs every 6 (six) hours as needed.     aspirin 81 mg EC delayed release tablet every 24 hours.     ezetimibe (ZETIA) 10 mg tablet Take 1 tablet (10 mg total) by mouth daily. 90 tablet 4   gabapentin (NEURONTIN) 100 mg capsule Take 1 capsule (100 mg total) by mouth at bedtime.     magnesium oxide (MAG-OX) 400 mg (241.3 mg magnesium) tablet Take 1 tablet (400 mg total) by mouth daily. 90 tablet 3   montelukast (SINGULAIR) 10 mg tablet TAKE 1 TABLET(10 MG) BY MOUTH EVERY NIGHT 30 tablet 0   omeprazole (PRILOSEC) 20 mg capsule Take 1 capsule (20 mg total) by mouth 2 (two) times daily. 180 capsule 3   oxyCODONE (ROXICODONE) 5 mg Immediate Release tablet Take 1 tablet (5 mg total) by mouth every 6 (six) hours as needed for pain.     polyethylene glycol (MIRALAX) 17 gram packet Take 1 packet (17 g total) by mouth daily. Mix in 8 ounces of water, juice, soda, coffee or tea prior to taking. 14 each 2   rosuvastatin (CRESTOR) 40 mg tablet Take 1 tablet (40 mg total) by mouth daily. 90 tablet 3   senna (SENOKOT) 8.6 mg tablet Take 1 tablet (8.6 mg total) by mouth nightly. 30 tablet 11   STIOLTO RESPIMAT 2.5-2.5 mcg/actuation mist for inhalation INHALE 2 INHALATION INTO THE LUNGS DAILY 4 g 11   valsartan (DIOVAN) 80 mg tablet TAKE 1 TABLET(80 MG) BY MOUTH DAILY 30 tablet 0  INPATIENT MEDICATIONS:Current Facility-Administered Medications Medication Dose Route Frequency Provider Last Rate Last Admin  aspirin EC delayed release tablet 81 mg  81 mg Oral Daily Paracha, Rumzah Anum, MD   81 mg at 12/21/22 0917  ethyl alcohol 62 % nasal swab 1 Application  1 Application Nasal Q12H Um, Hyo-Bin Marylene Land), MD   1 Application at 12/21/22 0917  heparin (PORCINE) injection 5,000 Units  5,000 Units Subcutaneous Q8H Um, Hyo-Bin Marylene Land), MD      montelukast (SINGULAIR) tablet 10 mg  10 mg Oral Nightly Paracha, Rumzah Anum, MD   10 mg at  12/20/22 2044  pantoprazole (PROTONIX) EC tablet 40 mg  40 mg Oral Q12H Paracha, Rumzah Anum, MD   40 mg at 12/21/22 0917  sodium chloride 0.9 % flush 3 mL  3 mL IV Push Q8H Um, Hyo-Bin Marylene Land), MD   3 mL at 12/21/22 0519  tiotropium-olodateroL (STIOLTO RESPIMAT) 2.5-2.5 mcg/actuation mist for inhalation 2 Inhalation   2 Inhalation  Inhalation Daily Paracha, Rumzah Anum, MD   2 Inhalation  at 12/21/22 1053 albuterol sulfate, sodium chlorideALLERGIES:No Known AllergiesPHYSICAL EXAM: Temp:  [96.2 ?F (35.7 ?C)-98.2 ?F (36.8 ?C)] 97.7 ?F (36.5 ?C)Pulse:  [46-81] 68Resp:  [10-27] 27BP: (103-144)/(57-76) 114/66SpO2:  [86 %-99 %] 93 %Device (Oxygen Therapy): nasal cannulaO2 Flow (L/min):  [1-2] 1GEN: alert and oriented, NADSKIN: Examined Head/scalp, Chest, RLE, LLE. Exam unremarkable except as noted:- diffuse red-pink rough gritty papules coalescing into plaques throughout the face, some with adherent yellow-white scale c/w diffuse actinic damage- 5mm scaley pink papule with adherent yellow scale on L eyebrow c/w AK - diffuse white scale on the lower extremities c/w xerosisLABS:Recent Labs Lab 11/07/241647 11/08/240741 11/09/240520 WBC 9.3 9.1 8.6 HGB 16.1 14.9 14.1 HCT 46.30 43.80 42.50 PLT 159 196 137* EOSINOPHILS 1.2 1.8 3.4  Recent Labs Lab 11/07/241647 11/08/240741 11/09/240520 NA 142 143 141 K 3.8  --  4.0 CL 104 109* 105 CO2 26 23 22  BUN 19* 20 21 CREATININE 1.49* 1.32* 1.25 GLU 105* 90 74 ANIONGAP 12 11 14   Recent Labs Lab 11/07/241647 ALT 19 AST 26 ALKPHOS 72 BILITOT 1.0  STUDIES:No results found.No results found.No results found.ASSESSMENT:Raymond Gardner is a 82 y.o. male admitted with esophageal impaction. Dermatology was consulted for skin eruption on the face and dry skin on the legs. Exam notable for diffuse actinic damage on the face and xerosis on the lower extremities. 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 On the lower extremities, patient most likely with xerosis.  The differential for actinic keratosis includes several sBCC vs SCC. These are less likely as there were no papules or plaques with pearly borders, no arborizing telangiectasias, no particular papules with ulceration. Patient would likely benefit from field therapy, which can be started outpatient. RECOMMENDATIONS:-Liberal use of emollient once to twice daily on the lower extremities (Aquaphor or Vaseline)-Outpatient follow up with dermatology for TBSE and field therapy for diffuse actinic damage. Patient has a dermatologist he would prefer to see and will provide the information to help with referral upon discharge. Discussed with attending, Dr. Bing Plume. Patient discharged before being seen by AttendingDermatology will sign off at this time. Please reconsult if there are any further questions or if new issues arise.Please call/text DERMATOLOGY DYNAMIC ROLE [facility] ([facility]) and page  [phone] nights/weekends with questions.Lynelle Smoke, MD PGY-2, Dermatology Resident [facility] Dermatology Service

## 2022-12-23 NOTE — Transfer Summaries
 [facility]	Medicie Progress NoteAttending Physician: Juliane Poot, MDLength of Stay: 1 daysSubjective/Interim: Admitted 11/8 with food/pill impaction. Monitored in MICU overnight, did not ultimately require intubation. EGD showed multiple esophageal strictures and ulceration, no mass. Food bolus was advanced to stomach. Biopsy and dilation were both deferred due to initial lack of clarity regarding home DAPT; subsequently determined he is in fact taking aspirin only. Dermatology was consulted in the MICU for a new erythematous, non-pruritic, nonpainful facial rash.He says that he specifically developed impaction when taking omeprazole, has difficulty with the size of the capsule.Lives with son and independent at baseline. Organizes medications independently.Objective: Vitals:Temp:  [96.2 ?F (35.7 ?C)-98.2 ?F (36.8 ?C)] 96.2 ?F (35.7 ?C)Pulse:  [46-81] 81Resp:  [10-25] 22BP: (103-144)/(57-76) 114/66SpO2:  [86 %-99 %] 91 %Device (Oxygen Therapy): room airO2 Flow (L/min):  [1-2] 1  I/O's:Intake/Output Summary (Last 24 hours) at 12/21/2022 1318Last data filed at 12/21/2022 1300Gross per 24 hour Intake 865.84 ml Output 1000 ml Net -134.16 ml  Physical Exam:Alert and interactive, no distressLight pink flat rash on the cheeksRRR, systolic murmur which patient says he has had for many yearsLungs clear, normal work of breathingAbdomen soft nondistended nontenderWWP, no edema, dry flaky skin on shinsLABS:ChemistryRecent Labs Lab 11/07/241647 11/07/241647 11/08/240741 11/09/240520 NA 142  --  143 141 K 3.8  --   --  4.0 CL 104  --  109* 105 CO2 26  --  23 22 BUN 19*  --  20 21 CREATININE 1.49*  --  1.32* 1.25 ANIONGAP 12  --  11 14 CALCIUM 8.8  --  8.9 8.4* MG  --    < > 1.9 1.6* PHOS  --   --   --  3.4  < > = values in this interval not displayed.  CBCRecent Labs Lab 11/07/241647 11/08/240741 11/09/240520 WBC 9.3 9.1 8.6 NEUTROPHILS 64.7 62.1 59.5 HGB 16.1 14.9 14.1 HCT 46.30 43.80 42.50 PLT 159 196 137* MCV 101.1* 99.8 100.7*  LFTsRecent Labs Lab 11/07/241647 ALT 19 AST 26 ALKPHOS 72 BILITOT 1.0 PROT 7.1 ALBUMIN 3.6 No results for input(s): LIPASE in the last 168 hours. CoagsRecent Labs Lab 11/08/240741 PTT 25.5 LABPROT 11.2 INR 1.02  GlucoseRecent Labs   11/07/241647 11/08/240741 11/09/240520 GLU 105* 90 74  Lactate, Procal, D-dimer, BNP, Trop, CRPNo results for input(s): LACTATE, PROCALCITON, DDIMER, BNP, BNPPRO, TROPONINT, HSCRP, FERRITIN, FIBRINOGEN in the last 168 hours.Invalid input(s): BTYPENATRIUR ABG:No results for input(s): PHART, PCO2ART, PO2ART, HCO3ART, O2SATART, LITERFLOW in the last 168 hours. VBG:No results for input(s): PHVEN, PCO2VEN, PO2VEN, HCO3VEN, O2SATVEN, LITERFLOW in the last 168 hours. UrineNo results for input(s): SPECGRAV, PHUR, BLOODU, PROTEINUA, GLUCOSEUR, KETONESU, BILIRUBINUR, UROBILINOGEN, LEUKOCYTESUR, WBCUA, NITRITE, BACTERIA, EPITHELIALCE, RBCUA, COGRCA in the last 72 hours. MicroLab Results Component Value Date  LABBLOO No Growth after 5 days of incubation 02/07/2022  LABBLOO No Growth after 5 days of incubation 02/07/2022  Lab Results Component Value Date  LEGIONELLAAG Negative 06/20/2022  SPNEUMONIAEU Negative 06/20/2022 Lab Results Component Value Date  SARSCOV2 Negative 06/21/2022  SARSCOV2 Positive (A) 02/07/2022 RVP: Lab Results Component Value Date  INFLUENZAART Not Detected 06/21/2022  INFLUENZBRT Not Detected 06/21/2022  Pending Labs:IMAGING:EGD 11/8/24Impression:            - Multiple esophageal strictures with area in the mid                        esophagus with severe ulceration. No mass lesion noted.                        -  Likely food bolus impaction with food passage into                        the stomach with the endoscope.                        - Dilation not performed.                        - No specimens collected. OTHER DIAGNOSTICS:NoneAssessment & Plan: 13M with severe esophageal stenosis, Barrett's esophagus, CAD s/p CABG in 2010, COPD, prostate cancer s/p XRT admitted to MICU from [facility] on 11/8 for esophageal food/pill impaction now s/p EGD, transferred to floor on 11/9.Severe esophageal stenosis Full liquid diet until follow up endoscopy in 2 weeksOkay to continue aspirin prior to EGDContinue PPI BID for 3 months, he is tolerating pantoprazole Will discontinue home omeprazole due to the large pill sizeOutpatient can consider lansoprazole solution which requires compoundingNutrition consultedAppreciate GI careRashDermatology was consulted in the MICUOutpatient follow upChronic macrocytosis and mild thrombocytopeniaNoted since 2023 and stableConsider outpatient hematological work upChronicCAD continue ASA, statinCOPD continue Stiolto, SingulairDiet: Diet Full Liquid Last stool: Last Bowel Movement: 11/07/24Access/Lines: PIVDVT PPx: sq hepDispo:  Anticipate discharge home without services Sunday morning 11/10Code status: FULL Caswell Corwin, M.D.[facility] Internal Medicine PGY-311/10/2022

## 2022-12-24 ENCOUNTER — Ambulatory Visit: Admit: 2022-12-24 | Payer: MEDICARE | Attending: Family | Primary: Internal Medicine

## 2022-12-24 ENCOUNTER — Encounter: Admit: 2022-12-24 | Payer: PRIVATE HEALTH INSURANCE | Primary: Internal Medicine

## 2022-12-24 ENCOUNTER — Encounter: Admit: 2022-12-24 | Payer: PRIVATE HEALTH INSURANCE | Attending: Family | Primary: Internal Medicine

## 2022-12-24 VITALS — Ht 71.0 in | Wt 177.0 lb

## 2022-12-24 DIAGNOSIS — E785 Hyperlipidemia, unspecified: Secondary | ICD-10-CM

## 2022-12-24 DIAGNOSIS — I2510 Atherosclerotic heart disease of native coronary artery without angina pectoris: Secondary | ICD-10-CM

## 2022-12-24 DIAGNOSIS — Z951 Presence of aortocoronary bypass graft: Secondary | ICD-10-CM

## 2022-12-24 DIAGNOSIS — I493 Ventricular premature depolarization: Secondary | ICD-10-CM

## 2022-12-24 DIAGNOSIS — J439 Emphysema, unspecified: Secondary | ICD-10-CM

## 2022-12-24 DIAGNOSIS — R1310 Dysphagia, unspecified: Secondary | ICD-10-CM

## 2022-12-24 DIAGNOSIS — K222 Esophageal obstruction: Secondary | ICD-10-CM

## 2022-12-24 DIAGNOSIS — I10 Essential (primary) hypertension: Secondary | ICD-10-CM

## 2022-12-24 DIAGNOSIS — K219 Gastro-esophageal reflux disease without esophagitis: Secondary | ICD-10-CM

## 2022-12-24 DIAGNOSIS — U071 COVID-19: Secondary | ICD-10-CM

## 2022-12-24 MED ORDER — LANSOPRAZOLE 30 MG DELAYED RELEASE,DISINTEGRATING TABLET
30 | ORAL_TABLET | Freq: Every day | ORAL | 2 refills | Status: AC
Start: 2022-12-24 — End: ?

## 2022-12-24 NOTE — Progress Notes
 [name] Post Discharge Outreach: Transition of Care NoteRisk of Unplanned Readmission: 18.66%PERTINENT INFORMATION:-  Outpatient GI to arrange follow up EGD in 2 weeks for esophageal dilation and biopsy  -  TOC eligible through 11/24/2024[facility] spoke with: Family memberPatrick (son)Discharging Hospital: [facility] Discharge Date: 12/22/2022    Discharge location: HomeHOSPITALIZATION: Esophageal obstruction due to food impactionsevere esophageal stenosis, Barrett's esophaguscontinue full liquid diet and PPI BID until follow up EGD in 2 weeks for esophageal dilation and biopsyCURRENT STATE:Since discharge patient reports feeling: BetterPatrick denies many issues or concerns. He he father is able to take his medications and he continues on the liquid diet. Raymond Gardner will be buying pudding today and he will pick up the pantoprazole. He denies any other concerns. He did not get call to schedule EGD. Out patient GI is to make the arrangements. RN CC asked that he calls back if he does not receive a call by Wednesday. Patient will also follow up with PCP. Patient cared for by: Family  REVIEW OF AFTER VISIT SUMMARY DOCUMENT: MEDICATION CHANGES:Validated NEW medications to take: YesValidated Changed medications to take: N/AValidated Stopped medications to NOT take: YesIssues obtaining prescriptions: N/A FOLLOW-UP APPOINTMENTS and TRANSPORTATION:Patient aware of scheduled appointments: NoAwareness and assistance with appointments needing to be scheduled: Yes and assistance providedTransportation concerns for follow-up appointment: NoDME and HOME HEALTH SERVICES:Durable medical equipment received: N/AContact has been made with home care agency: N/APlan established for follow up labs/tests: Yes ADDITIONAL PATIENT NEEDS:     [facility] Post Discharge Outreach:    RN Follow Up Note Nursing interventions requested for: CCRN INTERVENTION: AVS instruction clarificationNursing Note: Called and spoke to son, Raymond Gardner and explained the discrepancy with his Gabapentin.  He will make sure that he tells his dad to take Gabapentin 100 MG PO QHS and not in the morning as the check mark indicates.  He appreciated the update.Intervention Resolution Status:  Other: Spoke with son who will inform patient.

## 2022-12-24 NOTE — Progress Notes
 VIDEO TELEHEALTH VISIT: This clinician is part of the telehealth program and is conducting this visit in a currently approved location. For this visit the clinician and patient were present via interactive audio & video telecommunications system that permits real-time communications, via the [employer].Patient's use of the telehealth platform followed consent and acknowledges agreement to permit telehealth for this visit. [place] patient is located in: [place] clinician is appropriately licensed in the above state to provide care for this visit. Other individuals present during the telehealth encounter and their role/relation: noneBecause this visit was completed over video, a hands-on physical exam was not performed. Patient/parent or guardian understands and knows to call back if condition changes. [facility]Phone: [phone]: [phone]: 12/24/2022 Patient referred to [facility] for consultation by: Referring, NoNo address on fileReason for Referral:   ICD-10-CM  1. Dysphagia, unspecified type  R13.10   2. Gastroesophageal reflux disease, unspecified whether esophagitis present  K21.9   3. Esophageal stenosis  K22.2    HPI: Mong Fuson is a 82 y.o. male being interviewed today due to 1. Dysphagia, unspecified type    2. Gastroesophageal reflux disease, unspecified whether esophagitis present    3. Esophageal stenosis     I have reviewed the patient's referral records, including past medical history, past physical exams, pertinent labs, imaging and prior plans of care.  Pt seen in follow up s/p EGD dilation with Dr Caroll Rancher feeling very well post procedure He has no dysphagia or odynophagia post procedureHe is doing full liquids with no difficulty- he has yogurt, water and other liquids and is doing well.He is very happy with the progress after dilation.He has not taken any PPIHe is crushing most of his pills.Medical History: PMH PSH Past Medical History: Diagnosis Date  Coronary artery disease   COVID-19 02/08/2022  Dysphagia   Essential hypertension   Hx of CABG   Hyperlipidemia   Premature ventricular contractions   Pulmonary emphysema (HC Code)   Past Surgical History: Procedure Laterality Date  CORONARY ARTERY BYPASS GRAFT  2010  Social History Family History Social History Tobacco Use  Smoking status: Former   Current packs/day: 0.00   Types: Cigarettes   Quit date: 1987   Years since quitting: 37.8  Smokeless tobacco: Never Substance Use Topics  Alcohol use: Yes   Comment: 7 glasses of wine per week, 1 per day  Family History Problem Relation Age of Onset  COPD Brother   Coronary Artery Disease Brother   Medications Current Outpatient Medications on File Prior to Visit Medication Sig Dispense Refill  albuterol sulfate 90 mcg/actuation HFA aerosol inhaler Inhale 1 puff into the lungs every 6 (six) hours as needed.    aspirin 81 mg EC delayed release tablet every 24 hours.    ezetimibe (ZETIA) 10 mg tablet Take 1 tablet (10 mg total) by mouth daily. 90 tablet 4  fluticasone propionate (FLONASE) 50 mcg/actuation nasal spray Use 1 spray in each nostril daily. 16 g 2  gabapentin (NEURONTIN) 100 mg capsule Take 1 capsule (100 mg total) by mouth at bedtime.    magnesium oxide (MAG-OX) 400 mg (241.3 mg magnesium) tablet Take 1 tablet (400 mg total) by mouth daily. 90 tablet 3  montelukast (SINGULAIR) 10 mg tablet TAKE 1 TABLET(10 MG) BY MOUTH EVERY NIGHT 30 tablet 0  oxyCODONE (ROXICODONE) 5 mg Immediate Release tablet Take 1 tablet (5 mg total) by mouth every 6 (six) hours as needed for pain.    polyethylene glycol (MIRALAX) 17 gram  packet Take 1 packet (17 g total) by mouth daily. Mix in 8 ounces of water, juice, soda, coffee or tea prior to taking. 14 each 2 rosuvastatin (CRESTOR) 40 mg tablet Take 1 tablet (40 mg total) by mouth daily. 90 tablet 3  senna (SENOKOT) 8.6 mg tablet Take 1 tablet (8.6 mg total) by mouth nightly. 30 tablet 11  STIOLTO RESPIMAT 2.5-2.5 mcg/actuation mist for inhalation INHALE 2 INHALATION INTO THE LUNGS DAILY 4 g 11  valsartan (DIOVAN) 80 mg tablet TAKE 1 TABLET(80 MG) BY MOUTH DAILY 30 tablet 0  [DISCONTINUED] pantoprazole (PROTONIX) 40 mg tablet Take 1 tablet (40 mg total) by mouth every 12 (twelve) hours. 60 tablet 0 No current facility-administered medications on file prior to visit.   Allergies No Known Allergies Family History: non-contributory other than listed in HPIReview of Systems:  Pt reports any of the following within the last 2 weeks: [x] Denies any symptoms other than mentioned in HPI  [] Patient unable to provide meaningful ROS information because of language or cognitive limitations. [] Unintended weight loss >5lbs []  Unintended weight gain >5lbs [] Poor appetite [] Feels full quickly when eating [] Fevers/chills [] Feels tired sooner than others [] Jaundice [] Yellowing of eyes [] Sore throat [] Hoarseness or weak voice [] Shortness of breath [] Chronic cough [] Chest pain or discomfort:       [] Burning; [] Non-burning [] Palpitations [] Difficulty swallowing [] Food or liquid come back up to the throat:     [] Food; [] Liquid [] Hiccups [] Belching/burping [] Nausea or vomiting:     [] Nausea; [] Vomiting [] Abdominal distension or bloating [] Abdominal pain or discomfort level_____/10 on 0-10 scale.  [] Diarrhea [] Constipation [] Fecal incontinence (loss of control of stool) [] Rectal bleeding [] Blood clots in arms or legs     [] Arms; [] Legs [] Swelling of arms or legs     [] Arms; [] Legs []  Use of Blood Thinner other than aspirin []  Issues with Anesthesia in past [] Difficulty or pain during urination [] Pain in the:     [] Muscles [] Joints [] Skin rashes [] Easy bleeding or bruising [] Frequent or Severe Headaches [] Dizziness [] Problems with balance [] Loss of consciousness [] Tingling or weakness in arms or legs     [] Arms; [] Legs [] Seizures [] Problem with      [] Memory [] Paying attention [] Problems with      [] Anxiety [] Depression Objective: Limited physical exam is performed during telehealth visits  Physical Exam:Weight: Wt Readings from Last 1 Encounters: 12/24/22 80.3 kg  Gen: pleasant in NADRespirations: normal effortNeuro: AxOx3 Skin shows no signs of jaundiceLabs:No results found for: LIPASETotal Bilirubin Date Value Ref Range Status 12/19/2022 1.0 0.2 - 1.2 mg/dL Final   Comment:   Use of this assay is not recommended for patients undergoing treatment with Eltrombopag due to the potential for falsely elevated results.  06/20/2022 0.5 0.2 - 1.2 mg/dL Final   Comment:   Use of this assay is not recommended for patients undergoing treatment with Eltrombopag due to the potential for falsely elevated results.  06/19/2022 0.4 0.2 - 1.2 mg/dL Final   Comment:   Use of this assay is not recommended for patients undergoing treatment with Eltrombopag due to the potential for falsely elevated results.  02/13/2022 0.6 0.2 - 1.2 mg/dL Final   Comment:   Use of this assay is not recommended for patients undergoing treatment with Eltrombopag due to the potential for falsely elevated results.  Aspartate Aminotransferase (AST) Date Value Ref Range Status 12/19/2022 26 10 - 42 U/L Final 06/20/2022 20 10 - 42 U/L Final 06/19/2022 18 10 - 42 U/L Final 02/13/2022 17 10 - 42 U/L Final Alanine Aminotransferase (ALT)  Date Value Ref Range Status 12/19/2022 19 14 - 63 U/L Final 06/20/2022 22 14 - 63 U/L Final 06/19/2022 20 14 - 63 U/L Final 02/13/2022 21 14 - 63 U/L Final Alkaline Phosphatase Date Value Ref Range Status 12/19/2022 72 46 - 116 U/L Final 06/20/2022 55 46 - 116 U/L Final 06/19/2022 54 46 - 116 U/L Final 02/13/2022 49 46 - 116 U/L Final Prothrombin Time Date Value Ref Range Status 12/20/2022 11.2 9.6 - 12.3 seconds Final   Comment:   Specimen hemolyzed 06/19/2022 10.9 9.5 - 12.1 seconds Final 03/19/2022 11.0 9.6 - 12.3 seconds Final 02/07/2022 10.3 9.5 - 12.1 seconds Final Albumin Date Value Ref Range Status 12/19/2022 3.6 3.4 - 5.0 g/dL Final 52/84/1324 2.7 (L) 3.4 - 5.0 g/dL Final 40/11/2723 2.8 (L) 3.4 - 5.0 g/dL Final 36/64/4034 2.3 (L) 3.4 - 5.0 g/dL Final  No results found for: IGGResults for orders placed or performed during the hospital encounter of 12/19/22 Type and screen  Collection Time: 12/19/22 10:35 PM Result Value Ref Range  ABO Grouping O   Rh Type POS   Antibody Screen NEG   Specimen Expiration Date And Time 12/22/2022 23:59  Magnesium  Collection Time: 12/20/22  7:41 AM Result Value Ref Range  Magnesium 1.9 1.7 - 2.4 mg/dL PT/INR and PTT  Collection Time: 12/20/22  7:41 AM Result Value Ref Range  Prothrombin Time 11.2 9.6 - 12.3 seconds  INR 1.02 0.86 - 1.13  PTT 25.5 23.0 - 31.0 seconds Basic metabolic panel  Collection Time: 12/20/22  7:41 AM Result Value Ref Range  Sodium 143 136 - 144 mmol/L  Potassium    Chloride 109 (H) 98 - 107 mmol/L  CO2 23 20 - 30 mmol/L  Anion Gap 11 7 - 17  Glucose 90 70 - 100 mg/dL  BUN 20 8 - 23 mg/dL  Creatinine 7.42 (H) 5.95 - 1.30 mg/dL  Calcium 8.9 8.8 - 63.8 mg/dL  BUN/Creatinine Ratio 75.6 8.0 - 23.0  eGFR (Creatinine) 54 (L) >=60 mL/min/1.13m2 CBC auto differential  Collection Time: 12/20/22  7:41 AM Result Value Ref Range  WBC 9.1 4.0 - 11.0 x1000/?L  RBC 4.39 4.00 - 6.00 M/?L  Hemoglobin 14.9 13.2 - 17.1 g/dL  Hematocrit 43.32 95.18 - 50.00 %  MCV 99.8 80.0 - 100.0 fL  MCH 33.9 (H) 27.0 - 33.0 pg  MCHC 34.0 31.0 - 36.0 g/dL  RDW-CV 84.1 66.0 - 63.0 %  Platelets 196 150 - 420 x1000/?L  MPV 11.4 8.0 - 12.0 fL  Neutrophils 62.1 39.0 - 72.0 %  Lymphocytes 21.1 17.0 - 50.0 %  Monocytes 13.9 (H) 4.0 - 12.0 %  Eosinophils 1.8 0.0 - 5.0 %  Basophil 0.8 0.0 - 1.4 %  Immature Granulocytes 0.3 0.0 - 1.0 %  nRBC 0.0 0.0 - 1.0 %  Absolute Lymphocyte Count 1.92 0.60 - 3.70 x 1000/?L  Monocyte Absolute Count 1.27 (H) 0.00 - 1.00 x 1000/?L  Eosinophil Absolute Count 0.16 0.00 - 1.00 x 1000/?L  Basophil Absolute Count 0.07 0.00 - 1.00 x 1000/?L  Absolute Immature Granulocyte Count 0.03 0.00 - 0.30 x 1000/?L  Absolute nRBC 0.00 0.00 - 1.00 x 1000/?L  ANC (Abs Neutrophil Count) 5.66 2.00 - 7.60 x 1000/?L Upper Endoscopy  Collection Time: 12/20/22  5:52 PM Result Value Ref Range  Upper GI Endoscopy     [facility]_______________Patient Name: Johanthan Broad          MRN: [id number] Procedure Date: 12/20/2022 5:52 PM  Date of Birth: [birth year]Age: 7                               Admit Type: InpatientGender: Male                          CSN #: [id number] Note Status: Finalized                Attending MD: Harmon Dun , _______________________________________________________________________________ Procedure:             Upper GI endoscopyIndications:           Patient with a known severe esophageal strictures                        prior biopsy negative for EUS, admitted with                        presentation suspicious for acute food bolus impaction.Providers:             Anil B. Rosanne Gutting (Doctor)Referring MD:          Truman Hayward. Clent Ridges, MD (Referring MD)Medicines:             General AnesthesiaComplications:         No immediate complications.___________________________ ____________________________________________________Requesting Provider:   Procedure:             Pre-Anesthesia Assessment:                       - Prior to the procedure, a History and Physical was                        performed, and patient medications and allergies were                        reviewed. The patient's tolerance of previous                        anesthesia was also reviewed. The risks and benefits                        of the procedure and the sedation options and risks                        were discussed with the patient. All questions were                        answered, and informed consent was obtained. Prior                        Anticoagulants: The patient has taken Plavix                        (clopidogrel), last dose was 1 day prior to procedure.                        ASA Grade Assessment: III - A patient with severe                        systemic disease. After reviewing the risks and  benefits, the  patient was deemed in satisfactory                        condition to undergo the procedure.                       After obtaining informed consent, the endoscope was                        passed under direct vision. Throughout the procedure,                        the patient's blood pressure, pulse, and oxygen                        saturations were monitored continuously. The GIF ? HQ                        190 [id number] was introduced through the mouth, and                        advanced to the second part of duodenum. The upper GI                        endoscopy was accomplished without difficulty. The                        patient tolerated the procedure well. Retroflexion was                        performed in the stomach. The upper GI endoscopy was                        accomplished without difficulty. The patient tolerated                        the procedure well.                                                                               F indings:     One benign-appearing, intrinsic severe stenosis was found 25 to 29 cm      from the incisors. This stenosis measured 6 mm (inner diameter) x 5 cm      (in length). The mucosa was ulcerated for the entire length of the      stricture consistent with either pressure necrosis or severe erosive      esophagitis. The stenosis was traversed With difficulty.     At about 31 cm to 35 cm there was benign mucosal stricturing without any      ulceration noted. This was traversed.     Long segment Barretts esophagus was noted from 34 cm to 40 cm EG      junction no mass lesions were noted within this area.     Given the significant esophagitis findings and use of dual antiplatelet  agents dilation was not performed.     The entire examined stomach was normal. Food bolus was noted in the      gastric cardia and was likely pushed down with passage of the endoscope.     The in the duodenum was normal.                                                                                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 No mass lesion noted.                       - Likely food bolus impaction with food passage into                        the stomach with the endoscope.                       - Dilation not performed.                       - No specimens collected.Recommendation:        - Patient has a contact number available for                        emergencies. The signs and symptoms of potential                        delayed complications were discussed with the patient.                        Return to normal activities tomorrow. Written                        discharge instructions were provided to the patient.                       - Full liquid diet indefinitely.                       - Continue present medications. B.i.d. PPI.                       - Repeat upper endoscopy in 2 weeks for retreatment.                         We will need to be off Plavix medication for at least 7 days prior to next endoscopy at which time dilation                        can be performed.                       -  Discharge per primary team.                                                                               Procedure Code(s):     --- Professional ---                       [phone], Esophagogastroduodenoscopy, flexible,                        transoral; diagnostic, including collection of                        specimen(s) by brushing or washing, when performed                        (separate procedure)CPT copyright 2022 [employer]. All rights reserved.The codes documented in this report are preliminary and upon coder review may be revised to meet current compliance requirements.Attending Participation:     I personally performed the entire procedure.                                                                                Darrelyn Hillock, MD______________Anil Venetia Maxon, 12/20/2022 6:19:44 PMThis report has been signed electronically.Number of Addenda: 0Note Initiated On: 12/20/2022 5:52 PMEstimated Blood Loss:     Estimated blood loss: none.Scope BJ:YNWGN Out: Phosphorus  Collection Time: 12/21/22  5:20 AM Result Value Ref Range  Phosphorus 3.4 2.2 - 4.5 mg/dL Magnesium  Collection Time: 12/21/22  5:20 AM Result Value Ref Range  Magnesium 1.6 (L) 1.7 - 2.4 mg/dL Basic metabolic panel  Collection Time: 12/21/22  5:20 AM Result Value Ref Range  Sodium 141 136 - 144 mmol/L  Potassium 4.0 3.3 - 5.3 mmol/L  Chloride 105 98 - 107 mmol/L  CO2 22 20 - 30 mmol/L  Anion Gap 14 7 - 17  Glucose 74 70 - 100 mg/dL  BUN 21 8 - 23 mg/dL  Creatinine 5.62 1.30 - 1.30 mg/dL  Calcium 8.4 (L) 8.8 - 10.2 mg/dL  BUN/Creatinine Ratio 86.5 8.0 - 23.0  eGFR (Creatinine) 57 (L) >=60 mL/min/1.31m2 CBC auto differential  Collection Time: 12/21/22  5:20 AM Result Value Ref Range  WBC 8.6 4.0 - 11.0 x1000/?L  RBC 4.22 4.00 - 6.00 M/?L  Hemoglobin 14.1 13.2 - 17.1 g/dL  Hematocrit 78.46 96.29 - 50.00 %  MCV 100.7 (H) 80.0 - 100.0 fL  MCH 33.4 (H) 27.0 - 33.0 pg  MCHC 33.2 31.0 - 36.0 g/dL  RDW-CV 52.8 41.3 - 24.4 %  Platelets 137 (L) 150 - 420 x1000/?L  MPV 11.6 8.0 - 12.0 fL  Neutrophils 59.5 39.0 - 72.0 %  Lymphocytes 22.7 17.0 - 50.0 %  Monocytes 13.3 (H) 4.0 - 12.0 %  Eosinophils 3.4 0.0 - 5.0 %  Basophil 0.9 0.0 -  1.4 %  Immature Granulocytes 0.2 0.0 - 1.0 %  nRBC 0.0 0.0 - 1.0 %  Absolute Lymphocyte Count 1.95 0.60 - 3.70 x 1000/?L  Monocyte Absolute Count 1.14 (H) 0.00 - 1.00 x 1000/?L  Eosinophil Absolute Count 0.29 0.00 - 1.00 x 1000/?L  Basophil Absolute Count 0.08 0.00 - 1.00 x 1000/?L  Absolute Immature Granulocyte Count 0.02 0.00 - 0.30 x 1000/?L  Absolute nRBC 0.00 0.00 - 1.00 x 1000/?L  ANC (Abs Neutrophil Count) 5.10 2.00 - 7.60 x 1000/?L Basic metabolic panel  Collection Time: 12/22/22  3:40 AM Result Value Ref Range  Sodium 136 136 - 144 mmol/L  Potassium 3.9 3.3 - 5.3 mmol/L  Chloride 102 98 - 107 mmol/L  CO2 23 20 - 30 mmol/L  Anion Gap 11 7 - 17  Glucose 78 70 - 100 mg/dL  BUN 16 8 - 23 mg/dL  Creatinine 3.01 6.01 - 1.30 mg/dL  Calcium 8.5 (L) 8.8 - 10.2 mg/dL  BUN/Creatinine Ratio 09.3 8.0 - 23.0  eGFR (Creatinine) 58 (L) >=60 mL/min/1.41m2 CBC auto differential  Collection Time: 12/22/22  3:40 AM Result Value Ref Range  WBC 8.4 4.0 - 11.0 x1000/?L  RBC 4.50 4.00 - 6.00 M/?L  Hemoglobin 15.0 13.2 - 17.1 g/dL  Hematocrit 23.55 73.22 - 50.00 %  MCV 97.6 80.0 - 100.0 fL  MCH 33.3 (H) 27.0 - 33.0 pg  MCHC 34.2 31.0 - 36.0 g/dL  RDW-CV 02.5 42.7 - 06.2 %  Platelets 131 (L) 150 - 420 x1000/?L  MPV 12.0 8.0 - 12.0 fL  Neutrophils 54.3 39.0 - 72.0 %  Lymphocytes 26.1 17.0 - 50.0 %  Monocytes 15.2 (H) 4.0 - 12.0 %  Eosinophils 3.6 0.0 - 5.0 %  Basophil 0.6 0.0 - 1.4 %  Immature Granulocytes 0.2 0.0 - 1.0 %  nRBC 0.0 0.0 - 1.0 %  Absolute Lymphocyte Count 2.20 0.60 - 3.70 x 1000/?L  Monocyte Absolute Count 1.28 (H) 0.00 - 1.00 x 1000/?L  Eosinophil Absolute Count 0.30 0.00 - 1.00 x 1000/?L  Basophil Absolute Count 0.05 0.00 - 1.00 x 1000/?L  Absolute Immature Granulocyte Count 0.02 0.00 - 0.30 x 1000/?L  Absolute nRBC 0.00 0.00 - 1.00 x 1000/?L  ANC (Abs Neutrophil Count) 4.57 2.00 - 7.60 x 1000/?L Magnesium  Collection Time: 12/22/22  3:40 AM Result Value Ref Range  Magnesium 1.7 1.7 - 2.4 mg/dL Phosphorus  Collection Time: 12/22/22  3:40 AM Result Value Ref Range  Phosphorus 3.2 2.2 - 4.5 mg/dL Imaging:CXRResult Date: 11/7/2024Study: XR CHEST PA AND LATERAL History: Question of food impaction Comparison: XR CHEST PA OR AP 2022-06-19 Findings: Hazy obscuration of the left hemidiaphragm is noted likely representing atelectasis. There is no pleural effusion or pneumothorax. Soft tissue density at the right lung base likely represents known right-sided hiatal hernia. The postsurgical cardiomediastinal silhouette is stable status post CABG Median sternotomy wires are intact. Calcifications are noted within a tortuous thoracic aorta. Fracture, old, of the right lateral ninth rib. Impression: Hazy obscuration of the left hemidiaphragm is noted likely representing atelectasis, however aspiration/pneumonia could appear similar.  [place] [facility] Notify System Classification: Routine. Report initiated by:  Robyn Haber, MD Reported and signed by: Virl Axe, MD  [facility] Radiology and Biomedical Imaging XR Chest PA or APResult Date: 5/8/2024XR CHEST PA OR AP Date: 06/19/2022 8:35 PM INDICATION: Hypoxia COMPARISON: CTA CHEST (PE) W IV CONTRAST 2022-02-07 FINDINGS: Lungs and Pleura: Loops of bowel in a hiatal hernia filled with air project over the right lower lobe.  No pleural effusion or pneumothorax. Left basilar consolidation. Heart and Mediastinum:  Cardiomediastinal contours are unchanged.  Left basilar consolidation which may represent aspiration. 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 [facility] Rad[facility] Communication Center: Routine. Reported and signed by: Leilani Merl, MD  [facility] Radiology and Biomedical Imaging [place] Hip Right wo IV ContrastResult Date: 5/8/2024CT LUMBAR SPINE WO IV CONTRAST, [place] HIP RIGHT WO IV CONTRAST HISTORY: increased pain unable to walk COMPARISON: [place] HIP RIGHT WO IV CONTRAST 2022-06-19 18:28:23.000; CTA CHEST (PE) W IV CONTRAST 2022-02-07 (accession [id number]), NONE (accession [id number]) TECHNIQUE: [place] images of the right hip and lumbar spine were obtained without contrast. FINDINGS: BOWEL: Scattered colonic diverticulosis without evidence of acute diverticulitis. 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 PERITONEUM: Unremarkable. LYMPH NODES: Unremarkable. VESSELS: Limited noncontrast evaluation of the vessels demonstrates severe atherosclerotic calcifications. 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 BONES & SOFT TISSUE: Superior compression deformity of L5 and anterior compression deformity of T12, favored to be chronic. Additional multilevel degenerative changes of the lumbar spine with grade 1 anterolisthesis of L4 on L5. Mild retrolisthesis of L5 on S1. No acute traumatic listhesis. 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 There is also at least moderate bilateral neural foraminal narrowing at L4-L5 and L5-S1. There is at least moderate spinal stenosis at L3-L4. Left fat-containing direct inguinal hernia. No acute osseous abnormality of the bony pelvis or lumbar spine. 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 [place] [facility] Notify System Classification: Routine. (accession [id number]), Routine. (accession [id number]) Report initiated by:  Lesia Hausen, MD Reported and signed by: Vivianne Spence, MD  [facility] Radiology and Biomedical Imaging [place] Lumbar Spine wo IV ContrastResult Date: 5/8/2024CT LUMBAR SPINE WO IV CONTRAST, [place] HIP RIGHT WO IV CONTRAST HISTORY: increased pain unable to walk COMPARISON: [place] HIP RIGHT WO IV CONTRAST 2022-06-19 18:28:23.000; CTA CHEST (PE) W IV CONTRAST 2022-02-07 (accession [id number]), NONE (accession [id number]) TECHNIQUE: [place] images of the right hip and lumbar spine were obtained without contrast. FINDINGS: BOWEL: Scattered colonic diverticulosis without evidence of acute diverticulitis. 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 PERITONEUM: Unremarkable. LYMPH NODES: Unremarkable. VESSELS: Limited noncontrast evaluation of the vessels demonstrates severe atherosclerotic calcifications. 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 BONES & SOFT TISSUE: Superior compression deformity of L5 and anterior compression deformity of T12, favored to be chronic. Additional multilevel degenerative changes of the lumbar spine with grade 1 anterolisthesis of L4 on L5. Mild retrolisthesis of L5 on S1. No acute traumatic listhesis. 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 There is also at least moderate bilateral neural foraminal narrowing at L4-L5 and L5-S1. There is at least moderate spinal stenosis at L3-L4. Left fat-containing direct inguinal hernia. No acute osseous abnormality of the bony pelvis or lumbar spine. 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 [place] [facility] Notify System Classification: Routine. (accession [id number]), Routine. (accession [id number]) Report initiated by:  Lesia Hausen, MD Reported and signed by: Vivianne Spence, MD  [facility] Radiology  and Biomedical Imaging Echo 2D Complete w Doppler and CFI if Ind Image Enhancement 3D and or bubblesResult Date: 4/30/2024This result has an attachment that is not available.    ECHOCARDIOGRAM REPORT   Patient Name:   Esten Sthilaire. Date of Exam: 06/11/2022 Medical Rec #:  [id number]          Height:       71.0 in Accession #:    [id number]         Weight:       171.0 lb Date of Birth:  06/04/1940          BSA:          1.973 m? Patient Age:    82 years           BP:           149/75 mmHg Patient Gender: M                  HR:           59 bpm. Exam Location:  Inpatient Procedure: 2D Echo, Cardiac Doppler and Color Doppler Indications:    syncope History:        Patient has no prior history of Echocardiogram examinations.                 COPD; Risk Factors:Hypertension and Dyslipidemia. Sonographer:    Mike Gip Referring Phys: [id number] TOCHUKWU AGBATA IMPRESSIONS  1. Left ventricular ejection fraction, by estimation, is 55 to 60%. The left ventricle has normal function. The left ventricle has no regional wall motion abnormalities. Left ventricular diastolic function could not be evaluated.  2. Right ventricular systolic function is normal. The right ventricular size is normal. There is mildly elevated pulmonary artery systolic pressure. The estimated right ventricular systolic pressure is 38.8 mmHg.  3. The mitral valve is degenerative. No evidence of mitral valve regurgitation. No evidence of mitral stenosis. Moderate mitral annular calcification.  4. Tricuspid valve regurgitation is moderate.  5. 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 The aortic valve is calcified. Aortic valve regurgitation is mild. Aortic valve area, by VTI measures 0.79 cm?Marland Kitchen Aortic valve  mean gradient measures 16.0 mmHg. FINDINGS  Left Ventricle: Left ventricular ejection fraction, by estimation, is 55 to 60%. The left ventricle has normal function. The left ventricle has no regional wall motion abnormalities. The left ventricular internal cavity size was normal in size. There is  no left ventricular hypertrophy. Left ventricular diastolic function could not be evaluated due to mitral annular calcification (moderate or greater). Left ventricular diastolic function could not be evaluated. Right Ventricle: The right ventricular size is normal. No increase in right ventricular wall thickness. Right ventricular systolic function is normal. There is mildly elevated pulmonary artery systolic pressure. The tricuspid regurgitant velocity is 2.99  m/s, and with an assumed right atrial pressure of 3 mmHg, the estimated right ventricular systolic pressure is 38.8 mmHg. Left Atrium: Left atrial size was normal in size. Right Atrium: Right atrial size was normal in size. Pericardium: There is no evidence of pericardial effusion. Mitral Valve: The mitral valve is degenerative in appearance. Moderate mitral annular calcification. No evidence of mitral valve regurgitation. No evidence of mitral valve stenosis. Tricuspid Valve: The tricuspid valve is normal in structure. Tricuspid valve regurgitation is moderate . No evidence of tricuspid stenosis. Aortic Valve: 2D AVA 1.88 cm2. 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 The aortic valve is calcified. Aortic valve regurgitation is mild. Aortic regurgitation PHT measures 400 msec. Aortic valve mean gradient measures 16.0 mmHg. Aortic valve peak gradient measures 24.3 mmHg. Aortic valve area, by VTI measures 0.79 cm?. Pulmonic Valve: The pulmonic valve was not well visualized. Pulmonic valve regurgitation is mild. No evidence of pulmonic stenosis. Aorta: The aortic root and ascending aorta are structurally normal, with no evidence of dilitation. IAS/Shunts: No atrial level shunt detected by color flow Doppler. LEFT VENTRICLE PLAX 2D LVIDd:         4.10 cm     Diastology LVIDs:         2.90 cm     LV e' medial:    5.55 cm/s LV PW:         1.00 cm     LV E/e' medial:  14.1 LV IVS:        1.00 cm     LV e' lateral:   8.27 cm/s LVOT diam:     1.80 cm     LV E/e' lateral: 9.5 LV SV:         49 LV SV Index:   25 LVOT Area:     2.54 cm? LV Volumes (MOD) LV vol d, MOD A2C: 90.0 ml LV vol d, MOD A4C: 82.1 ml LV vol s, MOD A2C: 39.1 ml LV vol s, MOD A4C: 31.3 ml LV SV MOD A2C:     50.9 ml LV SV MOD A4C:     82.1 ml LV SV MOD BP:      51.2 ml RIGHT VENTRICLE             IVC RV Basal diam:  3.40 cm     IVC diam: 1.10 cm RV S prime:     11.90 cm/s TAPSE (M-mode): 1.8 cm LEFT ATRIUM             Index        RIGHT ATRIUM           Index LA diam:        2.80 cm 1.42 cm/m?   RA Area:     13.80 cm? LA Vol (A2C):   40.1 ml 20.33 ml/m?  RA Volume:   32.20 ml  16.32 ml/m? LA Vol (A4C):   57.6 ml 29.20 ml/m? LA Biplane Vol: 51.9 ml 26.31 ml/m?  AORTIC VALVE                     PULMONIC VALVE AV Area (Vmax):    0.83 cm?      PR End Diast Vel: 3.76 msec AV Area (Vmean):   0.76 cm? AV Area (VTI):     0.79 cm? AV Vmax:           246.33 cm/s AV Vmean:          177.667 cm/s AV VTI:            0.616 m AV Peak Grad:      24.3 mmHg AV Mean Grad:      16.0 mmHg LVOT Vmax:         80.20 cm/s LVOT Vmean:        53.100 cm/s LVOT VTI:          0.192 m LVOT/AV VTI ratio: 0.31 AI PHT:            400 msec AORTA Ao Root diam: 3.50 cm Ao Asc  diam:  3.30 cm MITRAL VALVE               TRICUSPID VALVE MV Area (PHT): 3.39 cm?    TR Peak grad:   35.8 mmHgMV Decel Time: 224 msec    TR Vmax:        299.00 cm/s MV E velocity: 78.40 cm/s MV A velocity: 58.30 cm/s  SHUNTS MV E/A ratio:  1.34        Systemic VTI: 0.19 m                            Systemic Diam: 1.80 cm Riley Lam MD Electronically signed by Riley Lam MD Signature Date/Time: 06/11/2022/1:28:01 PM   Final  MRI Hip Right without IV ContrastResult Date: 4/29/2024CLINICAL DATA:  Hip trauma, fracture suspected, xray done EXAM: MR OF THE RIGHT HIP WITHOUT CONTRAST TECHNIQUE: Multiplanar, multisequence MR imaging was performed. No intravenous contrast was administered. COMPARISON:  [place] 06/07/2022 FINDINGS: Bones: No acute fracture. No dislocation. No femoral head avascular necrosis. Bony pelvis intact without diastasis. SI joints and pubic symphysis within normal limits. No bone marrow edema. No marrow replacing bone lesion. Articular cartilage and labrum Articular cartilage: Areas of moderate chondral thinning and surface irregularity, most pronounced at the anterosuperior aspect of the hip joint. No subchondral marrow signal changes. Labrum: Degenerated, not well assessed in the absence of intra-articular contrast. No paralabral cyst. Joint or bursal effusion Joint effusion:  None. Bursae: No abnormal bursal fluid collection. Muscles and tendons Muscles and tendons: Mild tendinosis of the bilateral gluteus medius and minimus tendons. The hamstring, iliopsoas, rectus femoris, and adductor tendons appear intact without tear or significant tendinosis. Normal muscle bulk without atrophy or fatty infiltration. Other findings Miscellaneous: Mild generalized subcutaneous edema, nonspecific. No fluid collection. No inguinal lymphadenopathy. Trabeculated urinary bladder wall. Colonic diverticulosis. IMPRESSION: 1. No acute osseous abnormality of the right hip. 2. Moderate osteoarthritis of the right hip. 3. Mild tendinosis of the bilateral gluteus medius and minimus tendons. Electronically Signed   By: Duanne Guess D.O.   On: 06/10/2022 18:49MRI Lumbar Spine without IV ContrastResult Date: 4/29/2024CLINICAL DATA:  Low back pain, increased fracture risk EXAM: MRI LUMBAR SPINE WITHOUT CONTRAST TECHNIQUE: Multiplanar, multisequence MR imaging of the lumbar spine was performed. No intravenous contrast was administered. COMPARISON:  None Available. FINDINGS: Segmentation:  Standard. Alignment: Grade 1 anterolisthesis of L4 on L5. Slight retrolisthesis at L5-S1. Mild lumbar levocurvature. Vertebrae: No fracture, evidence of discitis, or bone lesion. Discogenic endplate marrow changes are most pronounced at L2-3 on the right and L5-S1 on the left. Chronic superior endplate Schmorl's node at L5 without associated marrow edema. Conus medullaris and cauda equina: Conus extends to the L1 level. Conus and cauda equina appear normal. Paraspinal and other soft tissues: Urinary bladder is moderately distended with trabeculated wall. No acute abnormality. Disc levels: T12-L1: No significant disc protrusion, foraminal stenosis, or canal stenosis. L1-L2: Minimal disc bulge and mild bilateral facet arthropathy. Borderline-mild canal stenosis. No significant foraminal stenosis. L2-L3: Disc bulge and endplate spurring, eccentric to the right. Moderate bilateral facet arthropathy with ligamentum flavum buckling. Moderate canal stenosis with right-sided subarticular recess stenosis. Mild right foraminal stenosis. L3-L4: Mild annular disc bulge with advanced bilateral facet arthropathy and ligamentum flavum buckling. Moderate canal stenosis and mild bilateral foraminal stenosis. L4-L5: Disc uncovering with diffuse disc bulge. Severe bilateral facet arthropathy with ligamentum flavum buckling and thin synovial cyst at the midline. There is severe canal stenosis  with bilateral subarticular recess stenosis. Moderate left foraminal stenosis. L5-S1: Disc bulge and endplate spurring with mild-to-moderate bilateral facet arthropathy. No canal stenosis or foraminal stenosis, however there is mass effect upon the exited bilateral L5 nerve roots by osteophytic endplate ridging. IMPRESSION: 1. 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 Moderate left foraminal stenosis at this level. 2. Moderate canal stenosis at L2-3 and L3-4. 3. Moderately distended urinary bladder with trabeculated wall, which may be secondary to chronic bladder outlet obstruction versus neurogenic bladder. Electronically Signed   By: Duanne Guess D.O.   On: 06/10/2022 18:40CT Head wo IV ContrastResult Date: 4/29/2024CLINICAL DATA:  Trauma, fall EXAM: [place] HEAD WITHOUT CONTRAST TECHNIQUE: Contiguous axial images were obtained from the base of the skull through the vertex without intravenous contrast. RADIATION DOSE REDUCTION: This exam was performed according to the departmental dose-optimization program which includes automated exposure control, adjustment of the mA and/or kV according to patient size and/or use of iterative reconstruction technique. COMPARISON:  None Available. FINDINGS: Brain: No acute intracranial findings are seen. There are no signs of bleeding within the cranium. There is no focal mass effect. Cortical sulci are prominent. There is decreased density in periventricular white matter. Scattered arterial calcifications are seen. Vascular: Scattered arterial calcifications are seen. Skull: No acute findings are seen. Sinuses/Orbits: There is mucosal thickening and possible small air-fluid level in right maxillary sinus. Other: None. IMPRESSION: No acute intracranial findings are seen. Atrophy. 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 Electronically Signed   By: Ernie Avena M.D.   On: 06/10/2022 15:11XR Chest AP Portable (BH GH YH LM WH)Result Date: 4/29/2024CLINICAL DATA:  Weakness EXAM: PORTABLE CHEST 1 VIEW COMPARISON:  04/10/2020 FINDINGS: Sternal wires. Normal cardiopericardial silhouette. No pneumothorax or effusion. There is a density along the right side of the mediastinum inferiorly consistent with known history of a hiatal hernia. No edema or consolidation. Overlapping cardiac leads. IMPRESSION: Postop chest.  Hiatal hernia Electronically Signed   By: Karen Kays M.D.   On: 06/10/2022 13:58VAS [place] LOWER EXTREMITY VENOUS (DVT) (7a-7p)Result Date: 4/27/2024This result has an attachment that is not available.  Lower Venous DVT Study Patient Name:  Toru Saxman.  Date of Exam:   06/07/2022 Medical Rec #: [id number]           Accession #:    [id number] Date of Birth: [birth year]           Patient Gender: M Patient Age:   77 years Exam Location:  [facility] Procedure:      VAS [place] LOWER EXTREMITY VENOUS (DVT) Referring Phys: HALEY SAGE -------------------------------------------------------------------------------- Indications: Pain. Risk Factors: Trauma. Comparison Study: No prior studies. Performing Technologist: Chanda Busing RVT Examination Guidelines: A complete evaluation includes B-mode imaging, spectral Doppler, color Doppler, and power Doppler as needed of all accessible portions of each vessel. Bilateral testing is considered an integral part of a complete examination. Limited examinations for reoccurring indications may be performed as noted. The reflux portion of the exam is performed with the patient in reverse Trendelenburg. +---------+---------------+---------+-----------+----------+--------------+ -RIGHT    -Compressibility-Phasicity-Spontaneity-Properties-Thrombus Aging- +---------+---------------+---------+-----------+----------+--------------+ -CFV      -Full           -Yes      -Yes        -          -              - +---------+---------------+---------+-----------+----------+--------------+ -SFJ      -  Full           -         -           -          -              - +---------+---------------+---------+-----------+----------+--------------+ -FV Prox  -Full           -         -           -          -              - +---------+---------------+---------+-----------+----------+--------------+ -FV Mid   -Full           -         -           -          -              - +---------+---------------+---------+-----------+----------+--------------+ -FV Distal-Full           -         -           -          -              - +---------+---------------+---------+-----------+----------+--------------+ -PFV      -Full           -         -           -          -              - +---------+---------------+---------+-----------+----------+--------------+ -POP      -Full           -Yes      -Yes        -          -              - +---------+---------------+---------+-----------+----------+--------------+ -PTV      -Full           -         -           -          -              - +---------+---------------+---------+-----------+----------+--------------+ -PERO     -Full           -         -           -          -              - +---------+---------------+---------+-----------+----------+--------------+ +----+---------------+---------+-----------+----------+--------------+ -LEFT-Compressibility-Phasicity-Spontaneity-Properties-Thrombus Aging- +----+---------------+---------+-----------+----------+--------------+ -CFV -Full           -Yes      -Yes        -          -              - +----+---------------+---------+-----------+----------+--------------+ Summary: RIGHT: - There is no evidence of deep vein thrombosis in the lower extremity. - No cystic structure found in the popliteal fossa. LEFT: - No evidence of common femoral vein obstruction. *See table(s) above for measurements and observations. Electronically signed by Heath Lark on 06/08/2022 at 1:35:35 PM.   Final  [place] Hip Right wo IV ContrastResult Date: 4/26/2024CLINICAL DATA:  Proximal right leg pain after fall 05/29/2022 in [place]. Decreased range of motion. EXAM: [place] OF THE RIGHT HIP WITHOUT CONTRAST TECHNIQUE: Multidetector [place] imaging of the right hip was performed according to the standard protocol. Multiplanar [place] image reconstructions were also generated. RADIATION DOSE REDUCTION: This exam was performed according to the departmental dose-optimization program which includes automated exposure control, adjustment of the mA and/or kV according to patient size and/or use of iterative reconstruction technique. COMPARISON:  Pelvis and right hip radiographs 06/07/2022 FINDINGS: Bones/Joint/Cartilage Mildly decreased bone mineralization. Moderate superomedial right femoroacetabular joint space narrowing with mild superolateral acetabular degenerative osteophytosis.1 No acute fracture is seen. Ligaments Suboptimally assessed by [place]. Muscles and Tendons Normal size and density of the regional musculature. Soft tissues No right hip joint effusion. High-grade atherosclerotic calcifications. Mild-to-moderate right fat containing right inguinal hernia with apparent fluid at the distal aspect. IMPRESSION: 1. No acute fracture is seen. 2. Moderate right femoroacetabular osteoarthritis. 3. Mild-to-moderate right fat containing inguinal hernia. Electronically Signed   By: Neita Garnet M.D.   On: 06/07/2022 15:57DG Hip Unilat W or Wo Pelvis 2-3 Views RightResult Date: 4/26/2024CLINICAL DATA:  Fall, brought in by ambulance for right upper leg pain since fall on 04/17 in [place]. Decreased range of motion. Difficulty walking. EXAM: DG HIP (WITH OR WITHOUT PELVIS) 2-3V RIGHT COMPARISON:  None Available. FINDINGS: There is no evidence of hip fracture or dislocation. Superolateral hip joint space narrowing with marginal spurring. Prominent vascular calcifications. IMPRESSION: 1. No acute fracture or dislocation. MRI examination for further evaluation is recommended if there is high clinical suspicion for a hip fracture. 2. Mild right hip osteoarthritis. Electronically Signed   By: Larose Hires D.O.   On: 06/07/2022 15:11FL Esophagram w Air ContrastResult Date: 3/11/2024FL ESOPHAGRAM W AIR CONTRAST [facility]) History:    hiatal hernia, dysphagia. . COMPARISON:   [place] 02/07/2022 Double contrast esophagram (with Air) was performed under fluoroscopic control. Patient is status post previous CABG. The examination show the swallowing mechanism to appear unremarkable. The esophagus has a normal appearing primary stripping wave. No definite intrinsic or extrinsic mass lesions can be seen of the esophagus. No obstruction to the flow of barium is seen into the stomach. A large hiatal hernia is identified with intrathoracic location of the stomach, as was seen on the [place] images. 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 No fluoroscopically identified gastroesophageal reflux is seen. It should be of note, the patient was short of breath the exam was terminated. After approximately 5 minutes of supine resting, the patient was clinically asymptomatic.. Impression: Large hiatal hernia containing the stomach. The stomach is rotated and a mesenteric axial rotation. No volvulus is seen. Fluoroscopy time: Recorded in epic Fluoroscopy images:  18 Reported and signed by: Donivan Scull, MD  [facility] Radiology and Biomedical Imaging Holter Monitor - 3 TO 7 DAYResult Date: 2/21/2024The 3 day Holter monitor revealed sinus rhythm. Average heart rate 74, low 47, and high sinus HR 120. There were 805 SVE's with 30 couplets and 27 atrial runs, longest 11 beats rate 119 and fastest 3 beats rate 164. There were 4,150 PVCs with 161 couplets. Short runs of trigeminy. The event button was not activated.Echo 2D Complete w Doppler and CFI if Ind Image Enhancement 3D and or bubblesResult Date: 03/14/2022~ * Normal left ventricular size, wall thickness, systolic function and wall motion. LVEF calculated by biplane Simpson's was 58%.  Normal diastolic function and filling pressures.* Normal right ventricular cavity size and systolic function.*  Moderate aortic valve calcification.  Mild aortic stenosis.  Peak aortic velocity is 2.5 m/s with a calculated peak aortic gradient of 26 mmHg.  Mean gradient is 13 mmHg.  Aortic valve area is 1.4 cm2.  Dimensionless index is 0.44.  Mild aortic regurgitation.* Normal mitral valve leaflets.  Mild posterior mitral annular calcification.  Trace mitral regurgitation.* Mild tricuspid regurgitation with normal right heart pressures.* No significant pericardial effusion.* No prior study available for comparison.CTA Chest (PE) w IV ContrastResult Date: 12/28/2023CTA CHEST (PE) W IV CONTRAST INDICATION: Pulmonary embolism (PE) suspected, high prob COMPARISON: NONE TECHNIQUE: [place] images of the chest were obtained from the lung bases through the apices after the intravenous administration of contrast. Coronal and oblique 3D/MIPS reformats are provided. IV CONTRAST: 80 mL Omnipaque 350 TECHNICAL LIMITATIONS: None. FINDINGS: PULMONARY ARTERIES: There is no evidence of filling defects in the pulmonary arteries to suspect pulmonary embolism. HEART: The heart is within normal limits for size. RV/LV RATIO: The RV/LV ratio measures less than 1.  There is no reflux of contrast into the IVC or hepatic veins. SYSTEMIC VASCULATURE: Aorta and major branches are unremarkable. LUNGS: Upper lobe predominant emphysema. Right lower lobe atelectasis secondary to large adjacent hiatal hernia. AIRWAYS: Unremarkable. PLEURA: Unremarkable. 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 UPPER ABDOMEN: Limited evaluation of the upper abdomen is unremarkable. BONES & SOFT TISSUES: Unremarkable. These findings were corroborated on the MIP images.  No evidence of pulmonary embolism or acute thoracic pathology. Large hiatal hernia with intrathoracic stomach and containing nonobstructed portions of the colon. [place] [facility] Notify System Classification: Routine. Report initiated by:  Verline Lema, MD Reported and signed by: Channing Mutters, MD  [facility] Radiology and Biomedical Imaging ED Diagnostic Ultrasound EchoResult Date: 12/28/2023Disclaimer:Please look in ED Provider Note for the Impression.  Assessment/Plan: Rigoverto Stonebarger is a 82 y.o. male being interviewed today for 1. Dysphagia, unspecified type    2. Gastroesophageal reflux disease, unspecified whether esophagitis present    3. Esophageal stenosis    Reviewed procedure in detail with pt.  Extensive esophageal stricturing Will change PPI to prevacid solutab since he cannot swallow whole pills and most PPI cannot be crushed.  He will return for next dilation in December.If further trouble swallowing before then, he will let us know. I have no additional Advanced Endoscopy recommendations at this time. The patient will continue to follow up with their PCP and other providers regarding ongoing medical needs. Thank you for the opportunity to see this patient in consultation.  We greatly appreciate assisting in your patient's care.  If there are further questions, please do not hesitate to contact us.Best Regards,Electronically Signed by Floreen Comber APRN 12/24/2022 Floreen Comber APRN MSN/MPHNurse Practitioner Clinical CoordinatorAdvanced Endoscopy & Pancreatic DiseasesDigestive [facility] of MedicineTel: [phone] Fax: [phone]

## 2022-12-25 ENCOUNTER — Encounter: Admit: 2022-12-25 | Payer: PRIVATE HEALTH INSURANCE | Primary: Internal Medicine

## 2022-12-26 ENCOUNTER — Encounter
Admit: 2022-12-26 | Payer: PRIVATE HEALTH INSURANCE | Attending: Vascular and Interventional Radiology | Primary: Internal Medicine

## 2023-01-08 ENCOUNTER — Encounter: Admit: 2023-01-08 | Payer: MEDICARE | Attending: Family | Primary: Internal Medicine

## 2023-01-08 ENCOUNTER — Encounter: Admit: 2023-01-08 | Payer: PRIVATE HEALTH INSURANCE | Attending: Gastroenterology | Primary: Internal Medicine

## 2023-01-08 DIAGNOSIS — I493 Ventricular premature depolarization: Secondary | ICD-10-CM

## 2023-01-08 DIAGNOSIS — U071 COVID-19: Secondary | ICD-10-CM

## 2023-01-08 DIAGNOSIS — J449 Chronic obstructive pulmonary disease, unspecified: Secondary | ICD-10-CM

## 2023-01-08 DIAGNOSIS — Z951 Presence of aortocoronary bypass graft: Secondary | ICD-10-CM

## 2023-01-08 DIAGNOSIS — E7800 Pure hypercholesterolemia, unspecified: Secondary | ICD-10-CM

## 2023-01-08 DIAGNOSIS — R1310 Dysphagia, unspecified: Secondary | ICD-10-CM

## 2023-01-08 DIAGNOSIS — K222 Esophageal obstruction: Secondary | ICD-10-CM

## 2023-01-08 DIAGNOSIS — C801 Malignant (primary) neoplasm, unspecified: Secondary | ICD-10-CM

## 2023-01-08 DIAGNOSIS — E785 Hyperlipidemia, unspecified: Secondary | ICD-10-CM

## 2023-01-08 DIAGNOSIS — I10 Essential (primary) hypertension: Secondary | ICD-10-CM

## 2023-01-08 DIAGNOSIS — I2510 Atherosclerotic heart disease of native coronary artery without angina pectoris: Secondary | ICD-10-CM

## 2023-01-08 DIAGNOSIS — K219 Gastro-esophageal reflux disease without esophagitis: Secondary | ICD-10-CM

## 2023-01-08 MED ORDER — CHOLECALCIFEROL (VITAMIN D3) 50 MCG (2,000 UNIT) TABLET
50 | Freq: Every day | ORAL | Status: AC
Start: 2023-01-08 — End: ?

## 2023-01-08 MED ORDER — SERTRALINE 50 MG TABLET
50 | Freq: Every day | ORAL | Status: AC
Start: 2023-01-08 — End: ?

## 2023-01-08 NOTE — Other
 ReviewReview NP: Otilio Saber

## 2023-01-08 NOTE — Other
 Patient is scheduled for EGD on 01-14-23 at [facility]. Pre-procedure call performed with son Keita.  Discharge transportation policy & NPO instructions given per pre-procedure guidelines. [place] states his father takes medications at night, he will not take any medications on the morning of the procedure. Patient verbalized their understanding. Chart sent for APP review d/t SOB with stairs.\

## 2023-01-10 NOTE — ED Provider Notes
 Chief Complaint Patient presents with  Foreign Body In Throat   Tx from [facility] ED to see [facility] for possible food bolus impaction. Sating in the mid to upper 90s. No resp distress noted. RESIDENT NOTE AND MDM PRESENTATION:  Pt is a 82 y.o. male presenting as transfer from [facility] for evaluation for concern for foreign body in esophagus.  Starting yesterday noticed difficulty keeping pills down.  Is able to swallow, but then feels they get stuck in his esophagus and then he must cough some back up.  Has not been able to eat or swallow pills at all today.  Endorses some discomfort in his esophagus, denies pain.  Had 1 episode like this about a year ago which required a scope.  Is not sure what the diagnosis was at that time.  Denies trouble breathing, trouble managing secretions.  Last took his medicines yesterday.Physical Exam:Vitals:  12/22/22 0741 BP: 119/60 Pulse: (!) 54 Resp: 18 Temp: 97.7 ?F (36.5 ?C) Patient does not appear to be in respiratory distress, is alert and answering questions appropriately with no gross neurological deficits. Moist MM. Lungs clear, regular rhythm with murmurs. Soft abdomen, nontender. MDM and Course:Concern foreign body in esophagus, narrowing of esophagus. Transfer from [facility] to see GI. GI consulted.Plan for scope tomorrow. ED obs, NPO at midnight. Patient unable to swallow medications, deferring home meds for now. Confirmed no stents.ADMISSION TO ED OBSERVATIONAdmit to Observation Status: Obs date: 12/19/2022  8:40 PM5' 11 (1.803 m)78.8 kgIndication for observation: ProcedureIn addition to serial exams and monitoring of hemodynamics, other anticipated interventions include: ProcedureRelevant home medications to be ordered by me and patient handoff given per protocol Discharge criteria: scope and/or cleared by GI. This patient was presented to and discussed with the supervising physician and a treatment plan and disposition were collaboratively agreed upon.Dictation software may have been used to create this note, please excuse any typos.Philomena Course, MD, PhDPGY-3 Emergency MedicineMDM  Physical ExamED Triage Vitals [12/19/22 1916]BP: (!) 147/77Pulse: 75Pulse from  O2 sat: n/aResp: 18Temp: 97.5 ?F (36.4 ?C)Temp src: TemporalSpO2: 95 % BP 119/60  - Pulse (!) 54  - Temp 97.7 ?F (36.5 ?C) (Oral)  - Resp 18  - Ht 5' 11 (1.803 m)  - Wt 78.8 kg  - SpO2 97%  - BMI 24.23 kg/m? Physical Exam ProceduresAttestation/Critical CarePatient Reevaluation: Attending Supervised: ResidentI saw and examined the patient. I agree with the findings and plan of care as documented in the resident's note except as noted below. Additional acute and/or chronic problems addressed:82 year old male presenting with dysphagia as transfer from [facility] to see GI.  Reports history of the same, symptoms have been ongoing for days, is vomiting up fluids and solids when he attempts to swallow but not consistently.  On exam, well-appearing, no acute distress, tolerating secretions, no concern for airway involvement, regular rate and rhythm, clear lungs, soft abdomen.  Differential diagnosis is highest for esophageal stricture but does not appear to be true impaction.  Discussed with GI, who also agrees unlikely to be acute impaction but patient will require endoscopy.  Placed in ED observation to obtain this. Caylea Foronda BuckClinical Impressions as of 01/10/23 0742 Dysphagia, unspecified type  ED DispositionSend To Or/Procedure Berenice Bouton, MDResident11/07/24 2043 Berenice Bouton, MDResident11/07/24 2051 Howell Pringle, MD11/07/24 2101 Howell Pringle, MD11/29/24 [phone]

## 2023-01-13 NOTE — H&P
 [facility]	 [facility]	Gastroenterology History & PhysicalHPI: Raymond Gardner is a 82 y.o. male being interviewed today due to 1. Dysphagia, unspecified type      2. Gastroesophageal reflux disease, unspecified whether esophagitis present      3. Esophageal stenosis       I have reviewed the patient's referral records, including past medical history, past physical exams, pertinent labs, imaging and prior plans of care.  Pt seen in follow up s/p EGD dilation with Dr Caroll Rancher feeling very well post procedure He has no dysphagia or odynophagia post procedureHe is doing full liquids with no difficulty- he has yogurt, water and other liquids and is doing well.He is very happy with the progress after dilation.He has not taken any PPIHe is crushing most of his pills.---------------------Will change PPI to prevacid solutab since he cannot swallow whole pills and most PPI cannot be crushed. Medical History: PMH PSH Past Medical History: Diagnosis Date  Cancer (HC Code) (HC CODE) (HC Code)   Prostate CA--radiation RX  COPD (chronic obstructive pulmonary disease) (HC Code)   Coronary artery disease   COVID-19 02/08/2022  Admission--- Acute respiratory failure with hypoxemia  Dysphagia   Esophageal stenosis 12/19/2022  Admission--Esophageal obstruction due to food impaction  Essential hypertension   GERD (gastroesophageal reflux disease)   dysphagia  Hx of CABG   Hypercholesteremia   Hyperlipidemia   Premature ventricular contractions   Past Surgical History: Procedure Laterality Date  CORONARY ARTERY BYPASS GRAFT  2010  UPPER GASTROINTESTINAL ENDOSCOPY    Social History Family History Social History Tobacco Use  Smoking status: Former   Current packs/day: 0.00   Types: Cigarettes   Quit date: 1987   Years since quitting: 37.9  Smokeless tobacco: Never Substance Use Topics Alcohol use: Yes   Comment: 7 glasses of wine per week, 1 per day  Family History Problem Relation Age of Onset  COPD Brother   Coronary Artery Disease Brother   Prior to Admission Medications No medications prior to admission.  Allergies No Known Allergies Family History: non-contributoryReview of Systems:  ROS: no SOB or CPNo fever, weight lossNo abdominal pain, diarrhea or constipationNo rashNo arthralgias or myalgiasObjective: Vitals:Last 24 hours:  Physical Exam: GEN: NAD, A&OCV: RRRRESP: CTABABD: Soft, NT, NDAssessment: Plan: Egd, dilation, biopsyThe procedure has been discussed in detail with the patient, including risks/benefits. Risks  include 1/100 chance of bleeding, perforation, or infection. Risk of aspiration, anesthesia reaction. The patient understands these risks and would like to proceed with the procedure Signed:Lina Hitch Rica Records, MD 12/2/20243:57 PM

## 2023-01-14 ENCOUNTER — Ambulatory Visit: Admit: 2023-01-14 | Payer: MEDICARE | Primary: Internal Medicine

## 2023-01-14 ENCOUNTER — Encounter: Admit: 2023-01-14 | Payer: PRIVATE HEALTH INSURANCE | Attending: Gastroenterology | Primary: Internal Medicine

## 2023-01-14 ENCOUNTER — Inpatient Hospital Stay: Admit: 2023-01-14 | Discharge: 2023-01-14 | Payer: MEDICARE | Attending: Gastroenterology | Primary: Internal Medicine

## 2023-01-14 ENCOUNTER — Encounter: Admit: 2023-01-14 | Payer: MEDICARE | Attending: Family | Primary: Internal Medicine

## 2023-01-14 DIAGNOSIS — D649 Anemia, unspecified: Secondary | ICD-10-CM

## 2023-01-14 DIAGNOSIS — I10 Essential (primary) hypertension: Secondary | ICD-10-CM

## 2023-01-14 DIAGNOSIS — Z8719 Personal history of other diseases of the digestive system: Secondary | ICD-10-CM

## 2023-01-14 DIAGNOSIS — D696 Thrombocytopenia, unspecified: Secondary | ICD-10-CM

## 2023-01-14 DIAGNOSIS — I493 Ventricular premature depolarization: Secondary | ICD-10-CM

## 2023-01-14 DIAGNOSIS — Z79899 Other long term (current) drug therapy: Secondary | ICD-10-CM

## 2023-01-14 DIAGNOSIS — R1310 Dysphagia, unspecified: Secondary | ICD-10-CM

## 2023-01-14 DIAGNOSIS — T18128D Food in esophagus causing other injury, subsequent encounter: Secondary | ICD-10-CM

## 2023-01-14 DIAGNOSIS — E039 Hypothyroidism, unspecified: Secondary | ICD-10-CM

## 2023-01-14 DIAGNOSIS — J439 Emphysema, unspecified: Secondary | ICD-10-CM

## 2023-01-14 DIAGNOSIS — I350 Nonrheumatic aortic (valve) stenosis: Secondary | ICD-10-CM

## 2023-01-14 DIAGNOSIS — Z7951 Long term (current) use of inhaled steroids: Secondary | ICD-10-CM

## 2023-01-14 DIAGNOSIS — G709 Myoneural disorder, unspecified: Secondary | ICD-10-CM

## 2023-01-14 DIAGNOSIS — E785 Hyperlipidemia, unspecified: Secondary | ICD-10-CM

## 2023-01-14 DIAGNOSIS — K449 Diaphragmatic hernia without obstruction or gangrene: Secondary | ICD-10-CM

## 2023-01-14 DIAGNOSIS — C801 Malignant (primary) neoplasm, unspecified: Secondary | ICD-10-CM

## 2023-01-14 DIAGNOSIS — K2270 Barrett's esophagus without dysplasia: Secondary | ICD-10-CM

## 2023-01-14 DIAGNOSIS — Z87891 Personal history of nicotine dependence: Secondary | ICD-10-CM

## 2023-01-14 DIAGNOSIS — K222 Esophageal obstruction: Secondary | ICD-10-CM

## 2023-01-14 DIAGNOSIS — I779 Disorder of arteries and arterioles, unspecified: Secondary | ICD-10-CM

## 2023-01-14 DIAGNOSIS — W44F3XD Food entering into or through a natural orifice, subsequent encounter: Secondary | ICD-10-CM

## 2023-01-14 DIAGNOSIS — I2510 Atherosclerotic heart disease of native coronary artery without angina pectoris: Secondary | ICD-10-CM

## 2023-01-14 DIAGNOSIS — K219 Gastro-esophageal reflux disease without esophagitis: Secondary | ICD-10-CM

## 2023-01-14 DIAGNOSIS — Z951 Presence of aortocoronary bypass graft: Secondary | ICD-10-CM

## 2023-01-14 DIAGNOSIS — J449 Chronic obstructive pulmonary disease, unspecified: Secondary | ICD-10-CM

## 2023-01-14 DIAGNOSIS — I739 Peripheral vascular disease, unspecified: Secondary | ICD-10-CM

## 2023-01-14 DIAGNOSIS — U071 COVID-19: Secondary | ICD-10-CM

## 2023-01-14 DIAGNOSIS — E7800 Pure hypercholesterolemia, unspecified: Secondary | ICD-10-CM

## 2023-01-14 MED ORDER — PHENYLEPHRINE 1 MG/10 ML (100 MCG/ML) IN 0.9 % SOD.CHLORIDE IV SYRINGE
1 | INTRAVENOUS | Status: DC | PRN
Start: 2023-01-14 — End: 2023-01-14
  Administered 2023-01-14 (×7): 1 mg/0 mL (00 mcg/mL) via INTRAVENOUS

## 2023-01-14 MED ORDER — EPHEDRINE 25 MG/5 ML IN 0.9% SODIUM CHLORIDE (WRAPPED ERX)
25 | INTRAVENOUS | Status: DC | PRN
Start: 2023-01-14 — End: 2023-01-14
  Administered 2023-01-14 (×2): 25 mg/5 mL (5 mg/mL) via INTRAVENOUS

## 2023-01-14 MED ORDER — LIDOCAINE (PF) 20 MG/ML (2 %) INJECTION SOLUTION
20 | INTRAVENOUS | Status: DC | PRN
Start: 2023-01-14 — End: 2023-01-14
  Administered 2023-01-14: 16:00:00 20 mg/mL (2 %) via INTRAVENOUS

## 2023-01-14 MED ORDER — NALOXONE 0.4 MG/ML INJECTION SOLUTION
0.4 mg/mL | INTRAVENOUS | Status: DC | PRN
Start: 2023-01-14 — End: 2023-01-14

## 2023-01-14 MED ORDER — LACTATED RINGERS INTRAVENOUS SOLUTION
INTRAVENOUS | Status: DC
Start: 2023-01-14 — End: 2023-01-14

## 2023-01-14 MED ORDER — ONDANSETRON HCL (PF) 4 MG/2 ML INJECTION SOLUTION
4 | Status: DC | PRN
Start: 2023-01-14 — End: 2023-01-14

## 2023-01-14 MED ORDER — SODIUM CHLORIDE 0.9 % (FLUSH) INJECTION SYRINGE
0.9 % | INTRAVENOUS | Status: DC | PRN
Start: 2023-01-14 — End: 2023-01-14

## 2023-01-14 MED ORDER — ONDANSETRON HCL (PF) 4 MG/2 ML INJECTION SOLUTION
4 | INTRAVENOUS | Status: DC | PRN
Start: 2023-01-14 — End: 2023-01-14
  Administered 2023-01-14: 16:00:00 4 mg/2 mL via INTRAVENOUS

## 2023-01-14 MED ORDER — PROPOFOL 10 MG/ML INTRAVENOUS EMULSION
10 | Status: DC | PRN
Start: 2023-01-14 — End: 2023-01-14
  Administered 2023-01-14: 16:00:00 10 mL/h

## 2023-01-14 MED ORDER — SODIUM CHLORIDE 0.9 % (FLUSH) INJECTION SYRINGE
0.9 % | Freq: Three times a day (TID) | INTRAVENOUS | Status: DC
Start: 2023-01-14 — End: 2023-01-14

## 2023-01-14 MED ORDER — SUCCINYLCHOLINE CHLOR 100 MG/5ML (20 MG/ML) IV SYRINGE (WRAPPED ERX)
100 | INTRAVENOUS | Status: DC | PRN
Start: 2023-01-14 — End: 2023-01-14
  Administered 2023-01-14: 16:00:00 100 mg/5 mL (20 mg/mL) via INTRAVENOUS

## 2023-01-14 MED ORDER — PROPOFOL 10 MG/ML INTRAVENOUS EMULSION
10 | INTRAVENOUS | Status: DC | PRN
Start: 2023-01-14 — End: 2023-01-14
  Administered 2023-01-14 (×2): 10 mg/mL via INTRAVENOUS

## 2023-01-14 MED ORDER — DEXAMETHASONE SODIUM PHOSPHATE 4 MG/ML INJECTION SOLUTION
4 | INTRAVENOUS | Status: DC | PRN
Start: 2023-01-14 — End: 2023-01-14
  Administered 2023-01-14: 16:00:00 4 mg/mL via INTRAVENOUS

## 2023-01-14 NOTE — Other
 Pt brought into room 2. Timeout performed. Sedated and inrubated per anesthesia. Bite block placed. Positioned left lateral on fluoro table.  Pt underwent EGD with Dr. Jeannene Patella.Stomach, distal esophagus, esophagus 30cm bxs taken.Pt tolerated procedure well. Report given to PACU RN.

## 2023-01-14 NOTE — Anesthesia Pre-Procedure Evaluation
 This is a 82 y.o. male scheduled for EGD W FLUORO.Review of Systems/ Medical History Patient summary, nursing notes, EKG/Cardiac Studies , Labs, pre-procedure vitals, height, weight and NPO status reviewed.No previous anesthesia concernsAnesthesia Evaluation:   No history of anesthetic complications  Estimated body mass index.01/14/23 : 24.01 kg/m? Last patient weight recorded. 01/14/23 : 78.1 kg Last patient height recorded. 12/24/22 : 5' 11 (1.803 m) CC/HPI: 75 M with hx of severe esophageal stricture and recent food impaction scheduled for EGD w/ fluoro.PMH  includes CAD, aortic stenosis, COPD, emphysema, PVD, hiatal hernia.  No new dyspnea, no angina.  Past Surgical History:  CORONARY ARTERY BYPASS GRAFTUPPER GASTROINTESTINAL ENDOSCOPYCardiovascular: Patient has a history of: hypercholesterolemia and hypertension.  No angina.  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   -Other Cardiovascular:   Echo 4/30/24IMPRESSIONS  1. Left ventricular ejection fraction, by estimation, is 55 to 60%. The left ventricle has normal function. The left ventricle has no regional wall motion abnormalities. Left ventricular diastolic function could not be evaluated.  2. Right ventricular systolic function is normal. The right ventricular size is normal. There is mildly elevated pulmonary artery systolic pressure. The estimated right ventricular systolic pressure is 38.8 mmHg.  3. The mitral valve is degenerative. No evidence of mitral valve regurgitation. No evidence of mitral stenosis. Moderate mitral annular calcification.  4. Tricuspid valve regurgitation is moderate.  5. 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 The aortic valve is calcified. Aortic valve regurgitation is mild. Aortic valve area, by VTI measures 0.79 cm?Marland Kitchen Aortic valve  mean gradient measures 16.0 mmHg. Cardiology note 04/23/22. Respiratory:      -Nicotine Dependence: Nicotine dependence: former smoker.-Lung Disorders: Patient has emphysema.       -COPD:  yesNeuromuscular: -Muscle disorders: neuromuscular disease Gastrointestinal/Genitourinary: Gastrointestinal Disorders:  Patient has diverticulosis and hiatal hernia. Patient has GERD.Hematological/Lymphatic: -Anemia: Patient has anemia.  -Coagulopathy:  He has thrombocytopenia.Endocrine/Metabolic: -Thyroid Disorders:  Patient has hypothyroidism.Physical ExamCardiovascular:      Rhythm: regularPulmonary:    Patient's breath sounds clear to auscultationAirway:  Mallampati: IITM distance: >3 FBNeck ROM: limitedMouth Opening: >3cmDental:  Dentition: edentulousAnesthesia PlanASA 3 The primary anesthesia plan is  general ETT. Perioperative Code Status confirmed: It is my understanding that the patient is currently designated as 'Full Code' and will remain so throughout the perioperative period.Anesthesia informed consent obtained. Consent obtained from: patientThe post operative pain plan is per surgeon management and IV analgesics.Opioid administration likely.Plan discussed with CRNA.Anesthesiologist's Pre Op NoteI personally evaluated and examined the patient prior to the intra-operative phase of care on the day of the procedure.Marland Kitchen

## 2023-01-14 NOTE — Other
 Post Anesthesia Transfer of Care NotePatient: Raymond FlemingProcedure(s) Performed: Procedure(s) (LRB):EGD W FLUORO (N/A)Last Vitals: I have reviewed the post-operative vital signs during the handoff as noted in the Epic chart.POSTOP HANDOFF :      Patient Location:  PACU     Level of Consciousness:  Sedated     VS stable since last recorded intra-op set? Yes       Oxygen source: maskPatient co-morbidities, intra-operative course, intake & output and antibiotics as per Anesthesia record were discussed with the RN.

## 2023-01-14 NOTE — Anesthesia Post-Procedure Evaluation
 Anesthesia Post-op NotePatient: Raymond FlemingProcedure(s):  Procedure(s) (LRB):EGD W FLUORO (N/A) Last Vitals:  I have reviewed the post-operative vital signs as noted in the Epic chart.POSTOP EVALUATION:      Patient Recovery Location:  PACU     Vital Signs Status:  Stable     Patient Participation:  Patient participated     Mental Status:  Awake, alert and oriented     Respiratory Status:  Acceptable     Airway Patency:  Patent     Cardiovascular/Hydration Status:  Stable     Pain Management:  Satisfactory to patient     Nausea/Vomiting Status:  Satisfactory to patientThere were no known notable events for this encounter.

## 2023-01-14 NOTE — Other
 Operative Diagnosis:Pre-op:   * No pre-op diagnosis entered * Patient Coded Diagnosis   Pre-op diagnosis: Food impaction of esophagus, subsequent encounter  Post-op diagnosis: Food impaction of esophagus, subsequent encounter  Patient Diagnosis   None    Post-op diagnosis:   * Food impaction of esophagus, subsequent encounter [Z61.096E, W44.F3XD]Operative Procedure(s) :Procedure(s) (LRB):EGD W FLUORO (N/A)Post-op Procedure & Diagnosis ConfirmationPost-op Diagnosis: Post-op Diagnosis confirmed (no changes)Post-op Procedure: Post-op Procedure confirmed (no changes)

## 2023-01-14 NOTE — Discharge Instructions
 Endoscopy After Care In case of any problems after your procedure, please call [phone] or go to the nearest Emergency Room for any urgent or severe symptoms, including fever, shortness of breath, chest pain, trouble swallowing, bleeding.The stricture of the upper esophagus was moderately improved.  Further dilation was seen after scope passage.   There is a hiatal hernia and prominent inflammation of the lower esophagus due to acid reflux.   It is important to take an acid blocking medication, each day in the morning, to heal the inflammation and prevent more ulcers and strictures.       --- Prevacid Solutab was ordered to your pharmacy--- if you are unable to obtain this medication, let Hillary know in your follow up visit and a different medication can be prescribed.    Biopsy of the stomach and lower esophagus was performed to also assess if a change of the lining of the esophagus is present. Please call for biopsy  results within 5-7 days [phone] see copy of procedure report.Take Liquid diet today and resume your prior diet as tolerated tomorrow if feeling well. You have a follow up with Floreen Comber, APRN of the advanced endoscopy team to review the biopsies and the above on 01/22/23. HOME CARE INSTRUCTIONSActivityYou may resume your regular activity but take frequent rest periods for the next 24 hours.You may experience abdominal discomfort such as a feeling of fullness or gas pains.Walking will help expel (get rid of) the air and reduce the bloated feeling in your abdomen.You may experience a sore throat for 2 to 3 days (if you had an upper endoscopy). This is normal. Gargling with salt water may help this.You may shower.A companion MUST drive you home, as the sedation impairs your reflexes and judgments.For the remainder of the day, you should not operate any vehicle or heavy machinery or make important decisions due to the sedation. We recommend resting quietly.NutritionDrink plenty of fluids.Begin with a light meal and progress to your normal diet as tolerated.Avoid alcoholic beverages for 24 hours or as instructed by your caregiver.MedicationsYou may resume your normal medications unless your caregiver tells you otherwise.Follow-up* Please check out with the front desk [phone] before leaving to see if and when your follow up should be scheduled. You will be called within 5-7 business days to schedule a telehealth appointment with our clinicians to review the biopsy/cytology results of your procedure.  If you do not hear from anyone or if you are concerned, please call  [phone].SEEK IMMEDIATE MEDICAL CARE IF:You have excessive nausea (feeling sick to your stomach) and/or vomiting.You have severe abdominal pain and distention (swelling).You have trouble swallowing.You have a temperature over 100? F (37.8? C).You have rectal bleeding, black tarry stools or vomiting of blood.You are unable to urinate or pass bowel movements.Drs. Mauro Kaufmann, Kerby Less, [facility] & Rosanne Gutting want you to have the best procedure experience possible.Please call with any questions, concerns or problems.M-F 8:30 am-4:30 pm at [phone]________________________________________________________________________July 2017

## 2023-01-14 NOTE — Other
 Pt transported to PACU via stretcher with CRNA and RN. Pt HD stable, denies SOB/dizziness/CV complaints/pain at this time. Discharge instructions reviewed with pt at bedside, pt verbalizing understanding. All safety maintained. Pt assisted off unit at time of discharge by unit staff via wheelchair for safety.

## 2023-01-22 ENCOUNTER — Ambulatory Visit: Admit: 2023-01-22 | Payer: MEDICARE | Attending: Family | Primary: Internal Medicine

## 2023-01-27 ENCOUNTER — Encounter: Admit: 2023-01-27 | Payer: PRIVATE HEALTH INSURANCE | Attending: Family | Primary: Internal Medicine

## 2023-01-27 ENCOUNTER — Ambulatory Visit: Admit: 2023-01-27 | Payer: MEDICARE | Attending: Family | Primary: Internal Medicine

## 2023-01-27 VITALS — Ht 71.0 in | Wt 176.0 lb

## 2023-01-27 DIAGNOSIS — R1319 Other dysphagia: Secondary | ICD-10-CM

## 2023-01-27 DIAGNOSIS — E785 Hyperlipidemia, unspecified: Secondary | ICD-10-CM

## 2023-01-27 DIAGNOSIS — I10 Essential (primary) hypertension: Secondary | ICD-10-CM

## 2023-01-27 DIAGNOSIS — U071 COVID-19: Secondary | ICD-10-CM

## 2023-01-27 DIAGNOSIS — K222 Esophageal obstruction: Secondary | ICD-10-CM

## 2023-01-27 DIAGNOSIS — R1310 Dysphagia, unspecified: Secondary | ICD-10-CM

## 2023-01-27 DIAGNOSIS — I493 Ventricular premature depolarization: Secondary | ICD-10-CM

## 2023-01-27 DIAGNOSIS — K219 Gastro-esophageal reflux disease without esophagitis: Secondary | ICD-10-CM

## 2023-01-27 DIAGNOSIS — C801 Malignant (primary) neoplasm, unspecified: Secondary | ICD-10-CM

## 2023-01-27 DIAGNOSIS — Z951 Presence of aortocoronary bypass graft: Secondary | ICD-10-CM

## 2023-01-27 DIAGNOSIS — I2510 Atherosclerotic heart disease of native coronary artery without angina pectoris: Secondary | ICD-10-CM

## 2023-01-27 DIAGNOSIS — J449 Chronic obstructive pulmonary disease, unspecified: Secondary | ICD-10-CM

## 2023-01-27 DIAGNOSIS — T18128A Food in esophagus causing other injury, initial encounter: Secondary | ICD-10-CM

## 2023-01-27 DIAGNOSIS — E7800 Pure hypercholesterolemia, unspecified: Secondary | ICD-10-CM

## 2023-01-27 NOTE — Progress Notes
 VIDEO TELEHEALTH VISIT: This clinician is part of the telehealth program and is conducting this visit in a currently approved location. For this visit the clinician and patient were present via interactive audio & video telecommunications system that permits real-time communications, via the  [employer].Patient's use of the telehealth platform followed consent and acknowledges agreement to permit telehealth for this visit. State patient is located in: [place] clinician is appropriately licensed in the above state to provide care for this visit. Other individuals present during the telehealth encounter and their role/relation: sonBecause this visit was completed over video, a hands-on physical exam was not performed. Patient/parent or guardian understands and knows to call back if condition changes. [facility] ADVANCED ENDOSCOPY& PANCREATIC DISEASEPhone: [phone]: [phone]: 01/27/2023 Patient referred to [facility] for consultation by: Elease Etienne, MD46 [address] [place],  [place] [id number] for Referral:   ICD-10-CM  1. Esophageal dysphagia  R13.19   2. Esophageal stenosis  K22.2   3. Esophageal obstruction due to food impaction  U13.244W   [id number].F3XA    HPI: Raymond Gardner is a 82 y.o. male being interviewed today due to 1. Esophageal dysphagia    2. Esophageal stenosis    3. Esophageal obstruction due to food impaction     I have reviewed the patient's referral records, including past medical history, past physical exams, pertinent labs, imaging and prior plans of care.  Pt seen in follow up s/p EGDHe is feeling much improved.  He is eating and drinking without difficulty- smaller bites and chewing carefully.  He is able to swallow much better.He is using the PPI capsule, opened and in applesauce.Medical History: PMH PSH Past Medical History: Diagnosis Date  Cancer (HC Code) (HC CODE) (HC Code)   Prostate CA--radiation RX  COPD (chronic obstructive pulmonary disease) (HC Code)   Coronary artery disease   COVID-19 02/08/2022  Admission--- Acute respiratory failure with hypoxemia  Dysphagia   Esophageal stenosis 12/19/2022  Admission--Esophageal obstruction due to food impaction  Essential hypertension   GERD (gastroesophageal reflux disease)   dysphagia  Hx of CABG   Hypercholesteremia   Hyperlipidemia   Premature ventricular contractions   Past Surgical History: Procedure Laterality Date  CORONARY ARTERY BYPASS GRAFT  2010  UPPER GASTROINTESTINAL ENDOSCOPY    Social History Family History Social History Tobacco Use  Smoking status: Former   Current packs/day: 0.00   Types: Cigarettes   Quit date: 1987   Years since quitting: 37.9  Smokeless tobacco: Never Substance Use Topics  Alcohol use: Yes   Comment: 7 glasses of wine per week, 1 per day  Family History Problem Relation Age of Onset  COPD Brother   Coronary Artery Disease Brother   Medications Current Outpatient Medications on File Prior to Visit Medication Sig Dispense Refill  albuterol sulfate 90 mcg/actuation HFA aerosol inhaler Inhale 1 puff into the lungs every 6 (six) hours as needed.    aspirin 81 mg EC delayed release tablet every 24 hours.    cholecalciferol, vitamin D3, 50 mcg (2,000 unit) tablet Take 2 tablets (4,000 Units total) by mouth daily.    ezetimibe (ZETIA) 10 mg tablet Take 1 tablet (10 mg total) by mouth daily. 90 tablet 4  fluticasone propionate (FLONASE) 50 mcg/actuation nasal spray Use 1 spray in each nostril daily. 16 g 2  gabapentin (NEURONTIN) 100 mg capsule Take 1 capsule (100 mg total) by mouth at bedtime.    magnesium oxide (MAG-OX) 400 mg (241.3 mg magnesium) tablet Take 1 tablet (400 mg total)  by mouth daily. 90 tablet 3  montelukast (SINGULAIR) 10 mg tablet TAKE 1 TABLET(10 MG) BY MOUTH EVERY NIGHT 30 tablet 0  polyethylene glycol (MIRALAX) 17 gram packet Take 1 packet (17 g total) by mouth daily. Mix in 8 ounces of water, juice, soda, coffee or tea prior to taking. 14 each 2  rosuvastatin (CRESTOR) 40 mg tablet Take 1 tablet (40 mg total) by mouth daily. 90 tablet 3  senna (SENOKOT) 8.6 mg tablet Take 1 tablet (8.6 mg total) by mouth nightly. 30 tablet 11  sertraline (ZOLOFT) 50 mg tablet Take 1 tablet (50 mg total) by mouth daily.    STIOLTO RESPIMAT 2.5-2.5 mcg/actuation mist for inhalation INHALE 2 INHALATION INTO THE LUNGS DAILY 4 g 11  valsartan (DIOVAN) 80 mg tablet TAKE 1 TABLET(80 MG) BY MOUTH DAILY 30 tablet 0  oxyCODONE (ROXICODONE) 5 mg Immediate Release tablet Take 1 tablet (5 mg total) by mouth every 6 (six) hours as needed for pain. (Patient not taking: Reported on 01/27/2023)   No current facility-administered medications on file prior to visit.   Allergies No Known Allergies Family History: non-contributory other than listed in HPIReview of Systems:  Pt reports any of the following within the last 2 weeks: [x] Denies any symptoms other than mentioned in HPI  [] Patient unable to provide meaningful ROS information because of language or cognitive limitations. [] Unintended weight loss >5lbs []  Unintended weight gain >5lbs [] Poor appetite [] Feels full quickly when eating [] Fevers/chills [] Feels tired sooner than others [] Jaundice [] Yellowing of eyes [] Sore throat [] Hoarseness or weak voice [] Shortness of breath [] Chronic cough [] Chest pain or discomfort:       [] Burning; [] Non-burning [] Palpitations [] Difficulty swallowing [] Food or liquid come back up to the throat:     [] Food; [] Liquid [] Hiccups [] Belching/burping [] Nausea or vomiting:     [] Nausea; [] Vomiting [] Abdominal distension or bloating [] Abdominal pain or discomfort level_____/10 on 0-10 scale.  [] Diarrhea [] Constipation [] Fecal incontinence (loss of control of stool) [] Rectal bleeding [] Blood clots in arms or legs     [] Arms; [] Legs [] Swelling of arms or legs     [] Arms; [] Legs []  Use of Blood Thinner other than aspirin []  Issues with Anesthesia in past [] Difficulty or pain during urination [] Pain in the:     [] Muscles [] Joints [] Skin rashes [] Easy bleeding or bruising [] Frequent or Severe Headaches [] Dizziness [] Problems with balance [] Loss of consciousness [] Tingling or weakness in arms or legs     [] Arms; [] Legs [] Seizures [] Problem with      [] Memory [] Paying attention [] Problems with      [] Anxiety [] Depression Objective: Limited physical exam is performed during telehealth visits  Physical Exam:Weight: Wt Readings from Last 1 Encounters: 01/27/23 79.8 kg  Gen: pleasant in NADRespirations: normal effortNeuro: AxOx3 Skin shows no signs of jaundiceLabs:No results found for: LIPASETotal Bilirubin Date Value Ref Range Status 12/19/2022 1.0 0.2 - 1.2 mg/dL Final   Comment:   Use of this assay is not recommended for patients undergoing treatment with Eltrombopag due to the potential for falsely elevated results.  06/20/2022 0.5 0.2 - 1.2 mg/dL Final   Comment:   Use of this assay is not recommended for patients undergoing treatment with Eltrombopag due to the potential for falsely elevated results.  06/19/2022 0.4 0.2 - 1.2 mg/dL Final   Comment:   Use of this assay is not recommended for patients undergoing treatment with Eltrombopag due to the potential for falsely elevated results.  02/13/2022 0.6 0.2 - 1.2 mg/dL Final   Comment:   Use of this assay  is not recommended for patients undergoing treatment with Eltrombopag due to the potential for falsely elevated results.  Aspartate Aminotransferase (AST) Date Value Ref Range Status 12/19/2022 26 10 - 42 U/L Final 06/20/2022 20 10 - 42 U/L Final 06/19/2022 18 10 - 42 U/L Final 02/13/2022 17 10 - 42 U/L Final Alanine Aminotransferase (ALT) Date Value Ref Range Status 12/19/2022 19 14 - 63 U/L Final 06/20/2022 22 14 - 63 U/L Final 06/19/2022 20 14 - 63 U/L Final 02/13/2022 21 14 - 63 U/L Final Alkaline Phosphatase Date Value Ref Range Status 12/19/2022 72 46 - 116 U/L Final 06/20/2022 55 46 - 116 U/L Final 06/19/2022 54 46 - 116 U/L Final 02/13/2022 49 46 - 116 U/L Final Prothrombin Time Date Value Ref Range Status 12/20/2022 11.2 9.6 - 12.3 seconds Final   Comment:   Specimen hemolyzed 06/19/2022 10.9 9.5 - 12.1 seconds Final 03/19/2022 11.0 9.6 - 12.3 seconds Final 02/07/2022 10.3 9.5 - 12.1 seconds Final Albumin Date Value Ref Range Status 12/19/2022 3.6 3.4 - 5.0 g/dL Final 19/14/7829 2.7 (L) 3.4 - 5.0 g/dL Final 56/21/3086 2.8 (L) 3.4 - 5.0 g/dL Final 57/84/6962 2.3 (L) 3.4 - 5.0 g/dL Final  No results found for: IGGResults for orders placed or performed during the hospital encounter of 01/14/23 Upper Endoscopy  Collection Time: 01/14/23 10:40 AM Result Value Ref Range  Upper GI Endoscopy     [facility]_______________Patient Name: Raymond Gardner          MRN: [id number] Procedure Date: 01/14/2023 10:40 AM    Date of Birth: Feb 20, 1942Age: 43                               Admit Type: OutpatientGender: Male                          CSN #: [id number] Note Status: Finalized                Attending MD: Carl Best. Aslanian , MD_______________________________________________________________________________ Procedure:             Upper GI endoscopyIndications:           Follow-up of esophageal stricture, proximal esophageal                        stricture in 82 yo male, initially presenting with                        food impaction. Inflamed circumferential proximal                        esophageal stricture. Dilated with scope passage 03/2022 and 09/2022. Pt                        reports that he is swallowing reasonably well when                         careful with food intake. Prevacid solutab recently                        presrcibed, pt uncertain if he has started taking.Providers:             Carl Best. Jeannene Patella, MD (Doctor)Medicines:  Monitored Anesthesia CareComplications:         No immediate complications._______________________________________________________________________________Requesting Provider:   Procedure:             Pre-Anesthesia Assessment:                       - ASA Grade Assessment: II - A patient with mild                        systemic disease.                       After obtaining informed consent, the endoscope was                        passed under direct vision. Throughout the procedure,                        the patient's blood pressure, pulse, and oxygen                        saturations were monitored continuously. The GIF ? HQ                        190 [id number] was introduced through the mouth, and                        advanced to the second part of  duodenum. The upper GI                        endoscopy was accomplished without difficulty. The                        patient tolerated the procedure well.                                                                               Findings:     The examined esophagus demonstrated nmarked improvement in the prior      proximal esophageal circumferential ulcer and stricture, with nl      appearing mucosa and mild narrowing, with mild resistance to scope      passage.     With scope passage, relaxation of the stricture was seen with a mucosal      relaxation in the 6 oclock position and decreased resistance. The wall      was intact. No bleeding seen. A few cm distal to this, extending over an      approximately 10 cm length was circumferential erythema with reactive      appearing mucosa and two, 4 mm superficial ulcerations near the GE      junction. 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 A mass, nodule or high risk muc osal area was not seen. There      was a nl appearing squamous island in the proximal segment of erythema.     There was a moderate hiatal hernia.  The stomach was diffusely edematous with a reactive, cobblestone like      mucosal appearance in the body and linear erythema in the antrum, biopsy      performed of antrum and body     The examined duodenum was normal.                                                                               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                       There was healing of the proximal esophageal mucosa                        seen with mild residual stricture which relaxed with                        scope passage. Diffuse ly reactive appearing gastric                        mucosa with a nonspecific appearance, biopsy performed.Recommendation:        Follow up biopsy results. Continued control of acid                        reflux, with PPI qd (of note, pt can crush pills and                        take, however, does not swallow whole. Pt is unsure if                        he has started taking the Prevacid solutab, his son                        [name], assists with his medications and picking up                        prescriptions from pharmacy). Consider evening dose of                        acid blocking medication, with H2 blocker (Pepcid).  Follow up pathology results. Treat h pylori if present. consider follow up if Barrett's seen. Follow                        the clinical course regarding stricture. Patient may                        not require additional endoscopy for proximal                        stricture if acid c ontrol can be achieved.                                                                               Attending Participation:     I personally performed the entire procedure.                                                                               Conni Slipper, MD_____________________Harry R. Aslanian, MD12/04/2022 12:42:10 PMThis report has been signed electronically.Number of Addenda: 0Note Initiated On: 01/14/2023 10:40 AMEstimated Blood Loss:     Estimated blood loss: none.Scope HY:QMVHQ Out: Pathology [facility])  Collection Time: 01/14/23 11:11 AM Result Value Ref Range  Pathology     Pathology received 3 specimen containers for the order. Received specimens are entering processing workflow. Surgical case     [facility])  Collection Time: 01/14/23 11:11 AM Result Value Ref Range  Surgical Case                                                   SURGICAL PATHOLOGY REPORT                                               Patient: Raymond Gardner, Raymond Gardner                  MR #: [id number] Accession #: [id number]        Submitted by: Conni Slipper, M.D.FINAL DIAGNOSIS1) STOMACH, BIOPSY:          - ANTRAL AND OXYNTIC MUCOSA WITHOUT SIGNIFICANT ABNORMALITY     - NEGATIVE FOR HELICOBACTER PYLORI2) ESOPHAGUS, DISTAL, BIOPSY:     - COLUMNAR MUCOSA WITH INTESTINAL METAPLASIA      - NEGATIVE FOR DYSPLASIA 3) ESOPHAGUS, 30 CM, BIOPSY:     - FRAGMENTS OF COLUMNAR MUCOSA WITH INTESTINAL METAPLASIA      - NEGATIVE FOR DYSPLASIA     Pathologist: Desma Paganini, M.D.   01/18/2023 12:04      * Report Electronically Signed Out *  This electronic signature indicates that the pathologist has personally reviewed the available gross and/or microscopic material and has based the diagnosis on that evaluation.    Specimen(s) Received: 1:STOMACH BIOPSY2:DISTAL ESOPHAGUS BIOPSY3:ESOPHAGUS 30 CMClinical History and Impression:Food impaction of esophagus, subsequent encounterStomach: Diffuse edema, esophagus: Erythema and ulcerationGross Description: Desma Paganini, M.D. ; Cherlynn Polo. Received in formalin, labeled with the patient's name and stomach biopsies are 4 tan irregular soft tissues ranging from 0.1 x 0.1 x <0.1 cm to 0.3 x 0.3 x 0.1 cm which are filtered into a mesh bag and submitted in toto in one cassette.2. Received in formalin, labeled with the patient's name and distal esophagus biopsies are 3 tan irregular soft tissues ranging from 0.2 x 0.2 x 0.1 cm to 0.4 x 0.3 x 0.1 cm which are filtered into a mesh bag and submitted in toto in one cassette.3. Received in formalin, labeled with the patient's name and esophagus 30 cm are multiple tan irregular soft tissues ranging from 0.1 x 0.1 x 0.1 cm to 0.2 x 0.2 x 0.1 cm which are filtered into a mesh bag and submitt ed in toto in one cassette.          Imaging:NR Fluoro Less Than 1 HourResult Date: 12/3/2024DISCLAIMER  This procedure captures images only.  There is no report.CXRResult Date: 11/7/2024Study: XR CHEST PA AND LATERAL History: Question of food impaction Comparison: XR CHEST PA OR AP 2022-06-19 Findings: Hazy obscuration of the left hemidiaphragm is noted likely representing atelectasis. There is no pleural effusion or pneumothorax. Soft tissue density at the right lung base likely represents known right-sided hiatal hernia. The postsurgical cardiomediastinal silhouette is stable status post CABG Median sternotomy wires are intact. Calcifications are noted within a tortuous thoracic aorta. Fracture, old, of the right lateral ninth rib. Impression: Hazy obscuration of the left hemidiaphragm is noted likely representing atelectasis, however aspiration/pneumonia could appear similar.  [facility] [facility] Notify System Classification: Routine. Report initiated by:  Robyn Haber, MD Reported and signed by: Virl Axe, MD  [facility] Radiology and Biomedical Imaging XR Chest PA or APResult Date: 5/8/2024XR CHEST PA OR AP Date: 06/19/2022 8:35 PM INDICATION: Hypoxia COMPARISON: CTA CHEST (PE) W IV CONTRAST 2022-02-07 FINDINGS: Lungs and Pleura: Loops of bowel in a hiatal hernia filled with air project over the right lower lobe. No pleural effusion or pneumothorax. Left basilar consolidation. Heart and Mediastinum:  Cardiomediastinal contours are unchanged.  Left basilar consolidation which may represent aspiration. 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 [facility] Radiology Communication Center: Routine. Reported and signed by: Leilani Merl, MD  [facility] Radiology and Biomedical Imaging [place] Hip Right wo IV ContrastResult Date: 5/8/2024CT LUMBAR SPINE WO IV CONTRAST, [place] HIP RIGHT WO IV CONTRAST HISTORY: increased pain unable to walk COMPARISON: [place] HIP RIGHT WO IV CONTRAST 2022-06-19 18:28:23.000; CTA CHEST (PE) W IV CONTRAST 2022-02-07 (accession [id number]), NONE (accession [id number]) TECHNIQUE: [place] images of the right hip and lumbar spine were obtained without contrast. FINDINGS: BOWEL: Scattered colonic diverticulosis without evidence of acute diverticulitis. 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 PERITONEUM: Unremarkable. LYMPH NODES: Unremarkable. VESSELS: Limited noncontrast evaluation of the vessels demonstrates severe atherosclerotic calcifications. 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 BONES & SOFT TISSUE: Superior compression deformity of L5 and anterior compression deformity of T12, favored to be chronic. Additional multilevel degenerative changes of  the lumbar spine with grade 1 anterolisthesis of L4 on L5. Mild retrolisthesis of L5 on S1. No acute traumatic listhesis. 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 There is also at least moderate bilateral neural foraminal narrowing at L4-L5 and L5-S1. There is at least moderate spinal stenosis at L3-L4. Left fat-containing direct inguinal hernia. No acute osseous abnormality of the bony pelvis or lumbar spine. 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 [place] [facility] Notify System Classification: Routine. (accession [id number]), Routine. (accession [id number]) Report initiated by:  Lesia Hausen, MD Reported and signed by: Vivianne Spence, MD  [facility] Radiology and Biomedical Imaging [place] Lumbar Spine wo IV ContrastResult Date: 5/8/2024CT LUMBAR SPINE WO IV CONTRAST, [place] HIP RIGHT WO IV CONTRAST HISTORY: increased pain unable to walk COMPARISON: [place] HIP RIGHT WO IV CONTRAST 2022-06-19 18:28:23.000; CTA CHEST (PE) W IV CONTRAST 2022-02-07 (accession [id number]), NONE (accession [id number]) TECHNIQUE: [place] images of the right hip and lumbar spine were obtained without contrast. FINDINGS: BOWEL: Scattered colonic diverticulosis without evidence of acute diverticulitis. 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 PERITONEUM: Unremarkable. LYMPH NODES: Unremarkable. VESSELS: Limited noncontrast evaluation of the vessels demonstrates severe atherosclerotic calcifications. 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 BONES & SOFT TISSUE: Superior compression deformity of L5 and anterior compression deformity of T12, favored to be chronic. Additional multilevel degenerative changes of the lumbar spine with grade 1 anterolisthesis of L4 on L5. Mild retrolisthesis of L5 on S1. No acute traumatic listhesis. 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 There is also at least moderate bilateral neural foraminal narrowing at L4-L5 and L5-S1. There is at least moderate spinal stenosis at L3-L4. Left fat-containing direct inguinal hernia. No acute osseous abnormality of the bony pelvis or lumbar spine. 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 [place] [facility] Notify System Classification: Routine. (accession [id number]), Routine. (accession [id number]) Report initiated by:  Lesia Hausen, MD Reported and signed by: Vivianne Spence, MD  [facility] Radiology and Biomedical Imaging Echo 2D Complete w Doppler and CFI if Ind Image Enhancement 3D and or bubblesResult Date: 4/30/2024This result has an attachment that is not available.    ECHOCARDIOGRAM REPORT   Patient Name:   Raymond Gardner. Date of Exam: 06/11/2022 Medical Rec #:  [id number]          Height:       71.0 in Accession #:    [id number]         Weight:       171.0 lb Date of Birth:  [birth year]          BSA:          1.973 m? Patient Age:    82 years           BP:           149/75 mmHg Patient Gender: M                  HR:           59 bpm. Exam Location:  Inpatient Procedure: 2D Echo, Cardiac Doppler and Color Doppler Indications:    syncope History:  Patient has no prior history of Echocardiogram examinations.                 COPD; Risk Factors:Hypertension and Dyslipidemia. Sonographer:    Mike Gip Referring Phys: [id number] TOCHUKWU AGBATA IMPRESSIONS  1. Left ventricular ejection fraction, by estimation, is 55 to 60%. The left ventricle has normal function. The left ventricle has no regional wall motion abnormalities. Left ventricular diastolic function could not be evaluated.  2. Right ventricular systolic function is normal. The right ventricular size is normal. There is mildly elevated pulmonary artery systolic pressure. The estimated right ventricular systolic pressure is 38.8 mmHg.  3. The mitral valve is degenerative. No evidence of mitral valve regurgitation. No evidence of mitral stenosis. Moderate mitral annular calcification.  4. Tricuspid valve regurgitation is moderate.  5. 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 The aortic valve is calcified. Aortic valve regurgitation is mild. Aortic valve area, by VTI measures 0.79 cm?Marland Kitchen Aortic valve  mean gradient measures 16.0 mmHg. FINDINGS  Left Ventricle: Left ventricular ejection fraction, by estimation, is 55 to 60%. The left ventricle has normal function. The left ventricle has no regional wall motion abnormalities. The left ventricular internal cavity size was normal in size. There is  no left ventricular hypertrophy. Left ventricular diastolic function could not be evaluated due to mitral annular calcification (moderate or greater). Left ventricular diastolic function could not be evaluated. Right Ventricle: The right ventricular size is normal. No increase in right ventricular wall thickness. Right ventricular systolic function is normal. There is mildly elevated pulmonary artery systolic pressure. The tricuspid regurgitant velocity is 2.99  m/s, and with an assumed right atrial pressure of 3 mmHg, the estimated right ventricular systolic pressure is 38.8 mmHg. Left Atrium: Left atrial size was normal in size. Right Atrium: Right atrial size was normal in size. Pericardium: There is no evidence of pericardial effusion. Mitral Valve: The mitral valve is degenerative in appearance. Moderate mitral annular calcification. No evidence of mitral valve regurgitation. No evidence of mitral valve stenosis. Tricuspid Valve: The tricuspid valve is normal in structure. Tricuspid valve regurgitation is moderate . No evidence of tricuspid stenosis. Aortic Valve: 2D AVA 1.88 cm2. 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 The aortic valve is calcified. Aortic valve regurgitation is mild. Aortic regurgitation PHT measures 400 msec. Aortic valve mean gradient measures 16.0 mmHg. Aortic valve peak gradient measures 24.3 mmHg. Aortic valve area, by VTI measures 0.79 cm?. Pulmonic Valve: The pulmonic valve was not well visualized. Pulmonic valve regurgitation is mild. No evidence of pulmonic stenosis. Aorta: The aortic root and ascending aorta are structurally normal, with no evidence of dilitation. IAS/Shunts: No atrial level shunt detected by color flow Doppler. LEFT VENTRICLE PLAX 2D LVIDd:         4.10 cm     Diastology LVIDs:         2.90 cm     LV e' medial:    5.55 cm/s LV PW:         1.00 cm     LV E/e' medial:  14.1 LV IVS:        1.00 cm     LV e' lateral:   8.27 cm/s LVOT diam:     1.80 cm     LV E/e' lateral: 9.5 LV SV:         49 LV SV  Index:   25 LVOT Area:     2.54 cm? LV Volumes (MOD) LV vol d, MOD A2C: 90.0 ml LV vol d, MOD A4C: 82.1 ml LV vol s, MOD A2C: 39.1 ml LV vol s, MOD A4C: 31.3 ml LV SV MOD A2C:     50.9 ml LV SV MOD A4C:     82.1 ml LV SV MOD BP:      51.2 ml RIGHT VENTRICLE             IVC RV Basal diam:  3.40 cm     IVC diam: 1.10 cm RV S prime:     11.90 cm/s TAPSE (M-mode): 1.8 cm LEFT ATRIUM             Index        RIGHT ATRIUM           Index LA diam:        2.80 cm 1.42 cm/m?   RA Area:     13.80 cm? LA Vol (A2C):   40.1 ml 20.33 ml/m?  RA Volume:   32.20 ml  16.32 ml/m? LA Vol (A4C):   57.6 ml 29.20 ml/m? LA Biplane Vol: 51.9 ml 26.31 ml/m?  AORTIC VALVE                     PULMONIC VALVE AV Area (Vmax):    0.83 cm?      PR End Diast Vel: 3.76 msec AV Area (Vmean):   0.76 cm? AV Area (VTI):     0.79 cm? AV Vmax:           246.33 cm/s AV Vmean:          177.667 cm/s AV VTI:            0.616 m AV Peak Grad:      24.3 mmHg AV Mean Grad:      16.0 mmHg LVOT Vmax:         80.20 cm/s LVOT Vmean:        53.100 cm/s LVOT VTI:          0.192 m LVOT/AV VTI ratio: 0.31 AI PHT:            400 msec AORTA Ao Root diam: 3.50 cm Ao Asc diam:  3.30 cm MITRAL VALVE               TRICUSPID VALVE MV Area (PHT): 3.39 cm?    TR Peak grad:   35.8 mmHgMV Decel Time: 224 msec    TR Vmax:        299.00 cm/s MV E velocity: 78.40 cm/s MV A velocity: 58.30 cm/s  SHUNTS MV E/A ratio:  1.34        Systemic VTI:  0.19 m                            Systemic Diam: 1.80 cm Riley Lam MD Electronically signed by Riley Lam MD Signature Date/Time: 06/11/2022/1:28:01 PM   Final  MRI Hip Right without IV ContrastResult Date: 4/29/2024CLINICAL DATA:  Hip trauma, fracture suspected, xray done EXAM: MR OF THE RIGHT HIP WITHOUT CONTRAST TECHNIQUE: Multiplanar, multisequence MR imaging was performed. No intravenous contrast was administered. COMPARISON:  [place] 06/07/2022 FINDINGS: Bones: No acute fracture. No dislocation. No femoral head avascular necrosis. Bony pelvis intact without diastasis. SI joints and pubic symphysis within normal limits. No bone marrow edema. No marrow replacing bone lesion.  Articular cartilage and labrum Articular cartilage: Areas of moderate chondral thinning and surface irregularity, most pronounced at the anterosuperior aspect of the hip joint. No subchondral marrow signal changes. Labrum: Degenerated, not well assessed in the absence of intra-articular contrast. No paralabral cyst. Joint or bursal effusion Joint effusion:  None. Bursae: No abnormal bursal fluid collection. Muscles and tendons Muscles and tendons: Mild tendinosis of the bilateral gluteus medius and minimus tendons. The hamstring, iliopsoas, rectus femoris, and adductor tendons appear intact without tear or significant tendinosis. Normal muscle bulk without atrophy or fatty infiltration. Other findings Miscellaneous: Mild generalized subcutaneous edema, nonspecific. No fluid collection. No inguinal lymphadenopathy. Trabeculated urinary bladder wall. Colonic diverticulosis. IMPRESSION: 1. No acute osseous abnormality of the right hip. 2. Moderate osteoarthritis of the right hip. 3. Mild tendinosis of the bilateral gluteus medius and minimus tendons. Electronically Signed   By: Duanne Guess D.O.   On: 06/10/2022 18:49MRI Lumbar Spine without IV ContrastResult Date: 4/29/2024CLINICAL DATA:  Low back pain, increased fracture risk EXAM: MRI LUMBAR SPINE WITHOUT CONTRAST TECHNIQUE: Multiplanar, multisequence MR imaging of the lumbar spine was performed. No intravenous contrast was administered. COMPARISON:  None Available. FINDINGS: Segmentation:  Standard. Alignment: Grade 1 anterolisthesis of L4 on L5. Slight retrolisthesis at L5-S1. Mild lumbar levocurvature. Vertebrae: No fracture, evidence of discitis, or bone lesion. Discogenic endplate marrow changes are most pronounced at L2-3 on the right and L5-S1 on the left. Chronic superior endplate Schmorl's node at L5 without associated marrow edema. Conus medullaris and cauda equina: Conus extends to the L1 level. Conus and cauda equina appear normal. Paraspinal and other soft tissues: Urinary bladder is moderately distended with trabeculated wall. No acute abnormality. Disc levels: T12-L1: No significant disc protrusion, foraminal stenosis, or canal stenosis. L1-L2: Minimal disc bulge and mild bilateral facet arthropathy. Borderline-mild canal stenosis. No significant foraminal stenosis. L2-L3: Disc bulge and endplate spurring, eccentric to the right. Moderate bilateral facet arthropathy with ligamentum flavum buckling. Moderate canal stenosis with right-sided subarticular recess stenosis. Mild right foraminal stenosis. L3-L4: Mild annular disc bulge with advanced bilateral facet arthropathy and ligamentum flavum buckling. Moderate canal stenosis and mild bilateral foraminal stenosis. L4-L5: Disc uncovering with diffuse disc bulge. Severe bilateral facet arthropathy with ligamentum flavum buckling and thin synovial cyst at the midline. There is severe canal stenosis with bilateral subarticular recess stenosis. Moderate left foraminal stenosis. L5-S1: Disc bulge and endplate spurring with mild-to-moderate bilateral facet arthropathy. No canal stenosis or foraminal stenosis, however there is mass effect upon the exited bilateral L5 nerve roots by osteophytic endplate ridging. IMPRESSION: 1. 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 Moderate left foraminal stenosis at this level. 2. Moderate canal stenosis at L2-3 and L3-4. 3. Moderately distended urinary bladder with trabeculated wall, which may be secondary to chronic bladder outlet obstruction versus neurogenic bladder. Electronically Signed   By: Duanne Guess D.O.   On: 06/10/2022 18:40CT Head wo IV ContrastResult Date: 4/29/2024CLINICAL DATA:  Trauma, fall EXAM: [place] HEAD WITHOUT CONTRAST TECHNIQUE: Contiguous axial images were obtained from the base of the skull through the vertex without intravenous contrast. RADIATION DOSE REDUCTION: This exam was performed according to the departmental dose-optimization program which includes automated exposure control, adjustment of the mA and/or kV according to patient size and/or use of iterative reconstruction technique. COMPARISON:  None Available. FINDINGS: Brain: No acute intracranial findings are seen. There are no signs of bleeding within the cranium. There is no focal mass effect.  Cortical sulci are prominent. There is decreased density in periventricular white matter. Scattered arterial calcifications are seen. Vascular: Scattered arterial calcifications are seen. Skull: No acute findings are seen. Sinuses/Orbits: There is mucosal thickening and possible small air-fluid level in right maxillary sinus. Other: None. IMPRESSION: No acute intracranial findings are seen. Atrophy. 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 Electronically Signed   By: Ernie Avena M.D.   On: 06/10/2022 15:11XR Chest AP Portable (BH [facility] Y[facility] LM WH)Result Date: 4/29/2024CLINICAL DATA:  Weakness EXAM: PORTABLE CHEST 1 VIEW COMPARISON:  04/10/2020 FINDINGS: Sternal wires. Normal cardiopericardial silhouette. No pneumothorax or effusion. There is a density along the right side of the mediastinum inferiorly consistent with known history of a hiatal hernia. No edema or consolidation. Overlapping cardiac leads. IMPRESSION: Postop chest.  Hiatal hernia Electronically Signed   By: Karen Kays M.D.   On: 06/10/2022 13:58VAS [place] LOWER EXTREMITY VENOUS (DVT) (7a-7p)Result Date: 4/27/2024This result has an attachment that is not available.  Lower Venous DVT Study Patient Name:  Raymond Gardner.  Date of Exam:   06/07/2022 Medical Rec #: [id number]           Accession #:    [id number] Date of Birth: 12/02/1940           Patient Gender: M Patient Age:   32 years Exam Location:  [facility] Procedure:      VAS [place] LOWER EXTREMITY VENOUS (DVT) Referring Phys: HALEY SAGE -------------------------------------------------------------------------------- Indications: Pain. Risk Factors: Trauma. Comparison Study: No prior studies. Performing Technologist: Chanda Busing RVT Examination Guidelines: A complete evaluation includes B-mode imaging, spectral Doppler, color Doppler, and power Doppler as needed of all accessible portions of each vessel. Bilateral testing is considered an integral part of a complete examination. Limited examinations for reoccurring indications may be performed as noted. The reflux portion of the exam is performed with the patient in reverse Trendelenburg. +---------+---------------+---------+-----------+----------+--------------+ -RIGHT    -Compressibility-Phasicity-Spontaneity-Properties-Thrombus Aging- +---------+---------------+---------+-----------+----------+--------------+ -CFV      -Full           -Yes      -Yes        -          -              - +---------+---------------+---------+-----------+----------+--------------+ -SFJ      -Full           -         -           -          -              - +---------+---------------+---------+-----------+----------+--------------+ -FV Prox  -Full           -         -           -          -              - +---------+---------------+---------+-----------+----------+--------------+ -FV Mid   -Full           -         -           -          -              - +---------+---------------+---------+-----------+----------+--------------+ -FV Distal-Full           -         -           -          -              - +---------+---------------+---------+-----------+----------+--------------+ -  PFV      -Full           -         -           -          -              - +---------+---------------+---------+-----------+----------+--------------+ -POP      -Full           -Yes      -Yes        -          -              - +---------+---------------+---------+-----------+----------+--------------+ -PTV      -Full           -         -           -          -              - +---------+---------------+---------+-----------+----------+--------------+ -PERO     -Full           -         -           -          -              - +---------+---------------+---------+-----------+----------+--------------+ +----+---------------+---------+-----------+----------+--------------+ -LEFT-Compressibility-Phasicity-Spontaneity-Properties-Thrombus Aging- +----+---------------+---------+-----------+----------+--------------+ -CFV -Full           -Yes      -Yes        -          -              - +----+---------------+---------+-----------+----------+--------------+ Summary: RIGHT: - There is no evidence of deep vein thrombosis in the lower extremity. - No cystic structure found in the popliteal fossa. LEFT: - No evidence of common femoral vein obstruction. *See table(s) above for measurements and observations. Electronically signed by Heath Lark on 06/08/2022 at 1:35:35 PM.   Final  [place] Hip Right wo IV ContrastResult Date: 4/26/2024CLINICAL DATA:  Proximal right leg pain after fall 05/29/2022 in [place]. Decreased range of motion. EXAM: [place] OF THE RIGHT HIP WITHOUT CONTRAST TECHNIQUE: Multidetector [place] imaging of the right hip was performed according to the standard protocol. Multiplanar [place] image reconstructions were also generated. RADIATION DOSE REDUCTION: This exam was performed according to the departmental dose-optimization program which includes automated exposure control, adjustment of the mA and/or kV according to patient size and/or use of iterative reconstruction technique. COMPARISON:  Pelvis and right hip radiographs 06/07/2022 FINDINGS: Bones/Joint/Cartilage Mildly decreased bone mineralization. Moderate superomedial right femoroacetabular joint space narrowing with mild superolateral acetabular degenerative osteophytosis.1 No acute fracture is seen. Ligaments Suboptimally assessed by [place]. Muscles and Tendons Normal size and density of the regional musculature. Soft tissues No right hip joint effusion. High-grade atherosclerotic calcifications. Mild-to-moderate right fat containing right inguinal hernia with apparent fluid at the distal aspect. IMPRESSION: 1. No acute fracture is seen. 2. Moderate right femoroacetabular osteoarthritis. 3. Mild-to-moderate right fat containing inguinal hernia. Electronically Signed   By: Neita Garnet M.D.   On: 06/07/2022 15:57DG Hip Unilat W or Wo Pelvis 2-3 Views RightResult Date: 4/26/2024CLINICAL DATA:  Fall, brought in by ambulance for right upper leg pain since fall on 04/17 in [place]. Decreased range of motion. Difficulty walking. EXAM: DG HIP (WITH OR WITHOUT PELVIS) 2-3V RIGHT COMPARISON:  None Available. FINDINGS: There is  no evidence of hip fracture or dislocation. Superolateral hip joint space narrowing with marginal spurring. Prominent vascular calcifications. IMPRESSION: 1. No acute fracture or dislocation. MRI examination for further evaluation is recommended if there is high clinical suspicion for a hip fracture. 2. Mild right hip osteoarthritis. Electronically Signed   By: Larose Hires D.O.   On: 06/07/2022 15:11FL Esophagram w Air ContrastResult Date: 3/11/2024FL ESOPHAGRAM W AIR CONTRAST [facility]) History:    hiatal hernia, dysphagia. . COMPARISON:   [place] 02/07/2022 Double contrast esophagram (with Air) was performed under fluoroscopic control. Patient is status post previous CABG. The examination show the swallowing mechanism to appear unremarkable. The esophagus has a normal appearing primary stripping wave. No definite intrinsic or extrinsic mass lesions can be seen of the esophagus. No obstruction to the flow of barium is seen into the stomach. A large hiatal hernia is identified with intrathoracic location of the stomach, as was seen on the [place] images. 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 No fluoroscopically identified gastroesophageal reflux is seen. It should be of note, the patient was short of breath the exam was terminated. After approximately 5 minutes of supine resting, the patient was clinically asymptomatic.. Impression: Large hiatal hernia containing the stomach. The stomach is rotated and a mesenteric axial rotation. No volvulus is seen. Fluoroscopy time: Recorded in epic Fluoroscopy images:  18 Reported and signed by: Donivan Scull, MD  [facility] Holter Monitor - 3 TO 7 DAYResult Date: 2/21/2024The 3 day Holter monitor revealed sinus rhythm. Average heart rate 74, low 47, and high sinus HR 120. There were 805 SVE's with 30 couplets and 27 atrial runs, longest 11 beats rate 119 and fastest 3 beats rate 164. There were 4,150 PVCs with 161 couplets. Short runs of trigeminy. The event button was not activated.Echo 2D Complete w Doppler and CFI if Ind Image Enhancement 3D and or bubblesResult Date: 03/14/2022~ * Normal left ventricular size, wall thickness, systolic function and wall motion. LVEF calculated by biplane Simpson's was 58%.  Normal diastolic function and filling pressures.* Normal right ventricular cavity size and systolic function.* Moderate aortic valve calcification.  Mild aortic stenosis.  Peak aortic velocity is 2.5 m/s with a calculated peak aortic gradient of 26 mmHg.  Mean gradient is 13 mmHg.  Aortic valve area is 1.4 cm2.  Dimensionless index is 0.44.  Mild aortic regurgitation.* Normal mitral valve leaflets.  Mild posterior mitral annular calcification.  Trace mitral regurgitation.* Mild tricuspid regurgitation with normal right heart pressures.* No significant pericardial effusion.* No prior study available for comparison.CTA Chest (PE) w IV ContrastResult Date: 12/28/2023CTA CHEST (PE) W IV CONTRAST INDICATION: Pulmonary embolism (PE) suspected, high prob COMPARISON: NONE TECHNIQUE: [place] images of the chest were obtained from the lung bases through the apices after the intravenous administration of contrast. Coronal and oblique 3D/MIPS reformats are provided. IV CONTRAST: 80 mL Omnipaque 350 TECHNICAL LIMITATIONS: None. FINDINGS: PULMONARY ARTERIES: There is no evidence of filling defects in the pulmonary arteries to suspect pulmonary embolism. HEART: The heart is within normal limits for size. RV/LV RATIO: The RV/LV ratio measures less than 1.  There is no reflux of contrast into the IVC or hepatic veins. SYSTEMIC VASCULATURE: Aorta and major branches are unremarkable. LUNGS: Upper lobe predominant emphysema. Right lower lobe atelectasis secondary to large adjacent hiatal hernia. AIRWAYS: Unremarkable. PLEURA: Unremarkable. 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 UPPER ABDOMEN: Limited  evaluation of the upper abdomen is unremarkable. BONES & SOFT TISSUES: Unremarkable. These findings were corroborated on the MIP images.  No evidence of pulmonary embolism or acute thoracic pathology. Large hiatal hernia with intrathoracic stomach and containing nonobstructed portions of the colon. [place] [facility] Notify System Classification: Routine. Report initiated by:  Verline Lema, MD Reported and signed by: Channing Mutters, MD  [facility] ED Diagnostic Ultrasound EchoResult Date: 12/28/2023Disclaimer:Please look in ED Provider Note for the Impression.  Assessment/Plan: Kilan Supak is a 82 y.o. male being interviewed today for 1. Esophageal dysphagia    2. Esophageal stenosis    3. Esophageal obstruction due to food impaction    Pt seen in follow up.  He is much improved. Reviewed procedure and pathology in detail.+intestinal metaplasia no dysplasia or malignancyPlan for EGD in 1 year, we will follow up in telehealth in 9 months and reassess swallowing at that time.Pt will let us know in mean time if any issues and continue PPII have no additional Advanced Endoscopy recommendations at this time. The patient will continue to follow up with their PCP and other providers regarding ongoing medical needs. Thank you for the opportunity to see this patient in consultation.  We greatly appreciate assisting in your patient's care.  If there are further questions, please do not hesitate to contact us.Best Regards,Electronically Signed by Floreen Comber APRN 01/27/2023 Floreen Comber APRN MSN/MPHNurse Practitioner Clinical CoordinatorAdvanced Endoscopy & Pancreatic DiseasesDigestive [facility] of MedicineTel: [phone] Fax: [phone]

## 2023-01-28 ENCOUNTER — Encounter: Admit: 2023-01-28 | Payer: PRIVATE HEALTH INSURANCE | Primary: Internal Medicine

## 2023-02-11 ENCOUNTER — Encounter: Admit: 2023-02-11 | Payer: PRIVATE HEALTH INSURANCE | Attending: Internal Medicine | Primary: Internal Medicine

## 2023-02-11 MED ORDER — MONTELUKAST 10 MG TABLET
10 | ORAL_TABLET | Freq: Every evening | ORAL | 1 refills | 90.00000 days | Status: AC
Start: 2023-02-11 — End: 2023-02-11

## 2023-02-11 MED ORDER — MONTELUKAST 10 MG TABLET
10 | ORAL_TABLET | Freq: Every evening | ORAL | 3 refills | 90.00000 days | Status: AC
Start: 2023-02-11 — End: ?

## 2023-02-25 ENCOUNTER — Encounter: Admit: 2023-02-25 | Payer: PRIVATE HEALTH INSURANCE | Attending: Internal Medicine | Primary: Internal Medicine

## 2023-02-25 ENCOUNTER — Ambulatory Visit: Admit: 2023-02-25 | Payer: MEDICARE | Attending: Internal Medicine | Primary: Internal Medicine

## 2023-02-25 VITALS — BP 100/60 | HR 79 | Ht 71.0 in | Wt 172.0 lb

## 2023-02-25 DIAGNOSIS — F32A Depression, unspecified depression type: Secondary | ICD-10-CM

## 2023-02-25 DIAGNOSIS — Z951 Presence of aortocoronary bypass graft: Secondary | ICD-10-CM

## 2023-02-25 DIAGNOSIS — E785 Hyperlipidemia, unspecified: Secondary | ICD-10-CM

## 2023-02-25 DIAGNOSIS — I10 Essential (primary) hypertension: Secondary | ICD-10-CM

## 2023-02-25 DIAGNOSIS — E7800 Pure hypercholesterolemia, unspecified: Secondary | ICD-10-CM

## 2023-02-25 DIAGNOSIS — Z0000 Encounter for general adult medical examination without abnormal findings: Secondary | ICD-10-CM

## 2023-02-25 DIAGNOSIS — K219 Gastro-esophageal reflux disease without esophagitis: Secondary | ICD-10-CM

## 2023-02-25 DIAGNOSIS — I2510 Atherosclerotic heart disease of native coronary artery without angina pectoris: Secondary | ICD-10-CM

## 2023-02-25 DIAGNOSIS — I493 Ventricular premature depolarization: Secondary | ICD-10-CM

## 2023-02-25 DIAGNOSIS — C801 Malignant (primary) neoplasm, unspecified: Secondary | ICD-10-CM

## 2023-02-25 DIAGNOSIS — R1310 Dysphagia, unspecified: Secondary | ICD-10-CM

## 2023-02-25 DIAGNOSIS — M4800 Spinal stenosis, site unspecified: Secondary | ICD-10-CM

## 2023-02-25 DIAGNOSIS — J449 Chronic obstructive pulmonary disease, unspecified: Secondary | ICD-10-CM

## 2023-02-25 DIAGNOSIS — K222 Esophageal obstruction: Secondary | ICD-10-CM

## 2023-02-25 DIAGNOSIS — U071 COVID-19: Secondary | ICD-10-CM

## 2023-03-30 ENCOUNTER — Encounter: Admit: 2023-03-30 | Payer: PRIVATE HEALTH INSURANCE | Attending: Internal Medicine | Primary: Internal Medicine

## 2023-03-30 DIAGNOSIS — I493 Ventricular premature depolarization: Secondary | ICD-10-CM

## 2023-03-30 DIAGNOSIS — U071 COVID-19: Secondary | ICD-10-CM

## 2023-03-30 DIAGNOSIS — I10 Essential (primary) hypertension: Secondary | ICD-10-CM

## 2023-03-30 DIAGNOSIS — K219 Gastro-esophageal reflux disease without esophagitis: Secondary | ICD-10-CM

## 2023-03-30 DIAGNOSIS — R1310 Dysphagia, unspecified: Secondary | ICD-10-CM

## 2023-03-30 DIAGNOSIS — K222 Esophageal obstruction: Secondary | ICD-10-CM

## 2023-03-30 DIAGNOSIS — J449 Chronic obstructive pulmonary disease, unspecified: Secondary | ICD-10-CM

## 2023-03-30 DIAGNOSIS — C801 Malignant (primary) neoplasm, unspecified: Secondary | ICD-10-CM

## 2023-03-30 DIAGNOSIS — E7800 Pure hypercholesterolemia, unspecified: Secondary | ICD-10-CM

## 2023-03-30 DIAGNOSIS — E785 Hyperlipidemia, unspecified: Secondary | ICD-10-CM

## 2023-03-30 DIAGNOSIS — Z951 Presence of aortocoronary bypass graft: Secondary | ICD-10-CM

## 2023-03-30 DIAGNOSIS — I2510 Atherosclerotic heart disease of native coronary artery without angina pectoris: Secondary | ICD-10-CM

## 2023-07-23 MED ORDER — STIOLTO RESPIMAT 2.5 MCG-2.5 MCG/ACTUATION SOLUTION FOR INHALATION
2.5-2.5 | Freq: Every day | RESPIRATORY_TRACT | 12 refills | 30.00000 days | Status: AC
Start: 2023-07-23 — End: ?

## 2023-08-26 ENCOUNTER — Encounter: Admit: 2023-08-26 | Payer: PRIVATE HEALTH INSURANCE | Attending: Internal Medicine | Primary: Internal Medicine

## 2023-10-01 ENCOUNTER — Encounter: Admit: 2023-10-01 | Payer: PRIVATE HEALTH INSURANCE | Primary: Internal Medicine

## 2023-12-06 ENCOUNTER — Encounter: Admit: 2023-12-06 | Payer: PRIVATE HEALTH INSURANCE | Attending: Internal Medicine | Primary: Internal Medicine

## 2023-12-08 MED ORDER — MONTELUKAST 10 MG TABLET
10 | ORAL_TABLET | Freq: Every evening | ORAL | 3 refills | 60.00000 days | Status: AC
Start: 2023-12-08 — End: ?

## 2024-01-02 ENCOUNTER — Encounter: Admit: 2024-01-02 | Payer: PRIVATE HEALTH INSURANCE | Primary: Internal Medicine

## 2024-01-02 MED ORDER — ROSUVASTATIN 40 MG TABLET
40 | ORAL_TABLET | Freq: Every day | ORAL | 5 refills | 30.00000 days | Status: AC
Start: 2024-01-02 — End: ?

## 2024-01-06 ENCOUNTER — Ambulatory Visit: Admit: 2024-01-06 | Payer: PRIVATE HEALTH INSURANCE | Attending: Family | Primary: Internal Medicine
